# Patient Record
Sex: Male | Born: 1945
Health system: Southern US, Community
[De-identification: ages and names within clinical notes are randomized; demographics above are authoritative.]

## PROBLEM LIST (undated history)

## (undated) DIAGNOSIS — I2584 Coronary atherosclerosis due to calcified coronary lesion: Secondary | ICD-10-CM

## (undated) DIAGNOSIS — I428 Other cardiomyopathies: Secondary | ICD-10-CM

## (undated) DIAGNOSIS — G4733 Obstructive sleep apnea (adult) (pediatric): Secondary | ICD-10-CM

## (undated) DIAGNOSIS — F1011 Alcohol abuse, in remission: Secondary | ICD-10-CM

## (undated) DIAGNOSIS — I10 Essential (primary) hypertension: Secondary | ICD-10-CM

## (undated) DIAGNOSIS — G4730 Sleep apnea, unspecified: Secondary | ICD-10-CM

## (undated) DIAGNOSIS — Q245 Malformation of coronary vessels: Secondary | ICD-10-CM

## (undated) DIAGNOSIS — E739 Lactose intolerance, unspecified: Secondary | ICD-10-CM

## (undated) DIAGNOSIS — E559 Vitamin D deficiency, unspecified: Secondary | ICD-10-CM

## (undated) DIAGNOSIS — F1010 Alcohol abuse, uncomplicated: Secondary | ICD-10-CM

## (undated) DIAGNOSIS — E785 Hyperlipidemia, unspecified: Secondary | ICD-10-CM

## (undated) DIAGNOSIS — R0602 Shortness of breath: Secondary | ICD-10-CM

## (undated) DIAGNOSIS — I2510 Atherosclerotic heart disease of native coronary artery without angina pectoris: Secondary | ICD-10-CM

## (undated) DIAGNOSIS — I5022 Chronic systolic (congestive) heart failure: Secondary | ICD-10-CM

## (undated) HISTORY — DX: Coronary atherosclerosis due to calcified coronary lesion: I25.84

## (undated) HISTORY — DX: Other cardiomyopathies: I42.8

## (undated) HISTORY — DX: Malformation of coronary vessels: Q24.5

## (undated) HISTORY — DX: Essential (primary) hypertension: I10

## (undated) HISTORY — DX: Obstructive sleep apnea (adult) (pediatric): G47.33

## (undated) HISTORY — DX: Atherosclerotic heart disease of native coronary artery without angina pectoris: I25.10

## (undated) HISTORY — DX: Alcohol abuse, in remission: F10.11

## (undated) HISTORY — DX: Hyperlipidemia, unspecified: E78.5

## (undated) HISTORY — DX: Chronic systolic (congestive) heart failure: I50.22

## (undated) HISTORY — DX: Shortness of breath: R06.02

## (undated) HISTORY — DX: Sleep apnea, unspecified: G47.30

## (undated) HISTORY — DX: Vitamin D deficiency, unspecified: E55.9

## (undated) HISTORY — DX: Alcohol abuse, uncomplicated: F10.10

## (undated) HISTORY — DX: Lactose intolerance, unspecified: E73.9

---

## 2006-04-23 ENCOUNTER — Ambulatory Visit: Payer: Self-pay | Admitting: Pulmonary Disease

## 2006-04-23 ENCOUNTER — Ambulatory Visit: Payer: Self-pay | Admitting: Cardiology

## 2006-04-23 ENCOUNTER — Inpatient Hospital Stay (HOSPITAL_COMMUNITY): Admission: EM | Admit: 2006-04-23 | Discharge: 2006-04-28 | Payer: Self-pay | Admitting: Emergency Medicine

## 2006-04-23 ENCOUNTER — Encounter: Payer: Self-pay | Admitting: Cardiology

## 2006-05-10 ENCOUNTER — Ambulatory Visit: Payer: Self-pay | Admitting: Cardiology

## 2006-05-10 LAB — CONVERTED CEMR LAB
BUN: 14 mg/dL (ref 6–23)
CO2: 29 meq/L (ref 19–32)
Creatinine, Ser: 1.4 mg/dL (ref 0.4–1.5)
Glucose, Bld: 72 mg/dL (ref 70–99)

## 2006-05-24 ENCOUNTER — Ambulatory Visit: Payer: Self-pay | Admitting: Internal Medicine

## 2006-06-16 ENCOUNTER — Ambulatory Visit: Payer: Self-pay | Admitting: Internal Medicine

## 2006-06-20 ENCOUNTER — Ambulatory Visit: Payer: Self-pay | Admitting: Cardiology

## 2006-08-04 ENCOUNTER — Ambulatory Visit: Payer: Self-pay | Admitting: Cardiology

## 2006-08-22 ENCOUNTER — Ambulatory Visit: Payer: Self-pay | Admitting: Internal Medicine

## 2006-08-22 LAB — CONVERTED CEMR LAB
BUN: 13 mg/dL (ref 6–23)
GFR calc Af Amer: 88 mL/min
GFR calc non Af Amer: 73 mL/min
PSA: 0.81 ng/mL (ref 0.10–4.00)
Potassium: 4.2 meq/L (ref 3.5–5.1)

## 2006-08-30 ENCOUNTER — Ambulatory Visit: Payer: Self-pay | Admitting: Cardiology

## 2006-09-29 ENCOUNTER — Ambulatory Visit: Payer: Self-pay | Admitting: Cardiology

## 2006-09-29 LAB — CONVERTED CEMR LAB
BUN: 11 mg/dL (ref 6–23)
CO2: 31 meq/L (ref 19–32)
Calcium: 9.1 mg/dL (ref 8.4–10.5)
GFR calc Af Amer: 98 mL/min
GFR calc non Af Amer: 81 mL/min
Pro B Natriuretic peptide (BNP): 96 pg/mL (ref 0.0–100.0)

## 2006-10-17 ENCOUNTER — Ambulatory Visit: Payer: Self-pay | Admitting: Gastroenterology

## 2006-10-19 ENCOUNTER — Ambulatory Visit: Payer: Self-pay

## 2006-10-19 ENCOUNTER — Encounter: Payer: Self-pay | Admitting: Cardiology

## 2006-10-21 ENCOUNTER — Ambulatory Visit: Payer: Self-pay | Admitting: Gastroenterology

## 2006-11-08 ENCOUNTER — Ambulatory Visit: Payer: Self-pay | Admitting: Internal Medicine

## 2006-12-27 ENCOUNTER — Ambulatory Visit: Payer: Self-pay | Admitting: Internal Medicine

## 2007-01-09 ENCOUNTER — Ambulatory Visit: Payer: Self-pay | Admitting: Cardiology

## 2007-01-12 ENCOUNTER — Ambulatory Visit: Payer: Self-pay | Admitting: Cardiology

## 2007-01-12 LAB — CONVERTED CEMR LAB
Calcium: 9.1 mg/dL (ref 8.4–10.5)
Creatinine, Ser: 1.2 mg/dL (ref 0.4–1.5)
GFR calc Af Amer: 80 mL/min
Glucose, Bld: 107 mg/dL — ABNORMAL HIGH (ref 70–99)
Potassium: 4.1 meq/L (ref 3.5–5.1)
Pro B Natriuretic peptide (BNP): 92 pg/mL (ref 0.0–100.0)

## 2007-03-30 ENCOUNTER — Ambulatory Visit: Payer: Self-pay | Admitting: Cardiology

## 2007-08-02 DIAGNOSIS — I10 Essential (primary) hypertension: Secondary | ICD-10-CM | POA: Insufficient documentation

## 2007-08-02 DIAGNOSIS — Z8679 Personal history of other diseases of the circulatory system: Secondary | ICD-10-CM | POA: Insufficient documentation

## 2008-01-18 ENCOUNTER — Encounter: Payer: Self-pay | Admitting: Internal Medicine

## 2008-01-19 ENCOUNTER — Ambulatory Visit: Payer: Self-pay | Admitting: Internal Medicine

## 2008-01-19 LAB — CONVERTED CEMR LAB
Basophils Absolute: 0 10*3/uL (ref 0.0–0.1)
Basophils Relative: 0 % (ref 0.0–3.0)
CO2: 31 meq/L (ref 19–32)
Creatinine, Ser: 1.2 mg/dL (ref 0.4–1.5)
Eosinophils Relative: 7.1 % — ABNORMAL HIGH (ref 0.0–5.0)
GFR calc non Af Amer: 65 mL/min
Glucose, Bld: 102 mg/dL — ABNORMAL HIGH (ref 70–99)
Hemoglobin: 15.3 g/dL (ref 13.0–17.0)
Lymphocytes Relative: 37.5 % (ref 12.0–46.0)
Neutro Abs: 1.5 10*3/uL (ref 1.4–7.7)
PSA: 0.78 ng/mL (ref 0.10–4.00)
RBC: 5.31 M/uL (ref 4.22–5.81)
RDW: 14.1 % (ref 11.5–14.6)
Sodium: 142 meq/L (ref 135–145)

## 2008-01-22 ENCOUNTER — Encounter: Payer: Self-pay | Admitting: Internal Medicine

## 2008-02-12 ENCOUNTER — Telehealth: Payer: Self-pay | Admitting: Internal Medicine

## 2008-03-21 ENCOUNTER — Ambulatory Visit: Payer: Self-pay | Admitting: Cardiology

## 2008-03-21 ENCOUNTER — Encounter: Payer: Self-pay | Admitting: Nurse Practitioner

## 2008-03-21 DIAGNOSIS — R9431 Abnormal electrocardiogram [ECG] [EKG]: Secondary | ICD-10-CM | POA: Insufficient documentation

## 2008-07-22 ENCOUNTER — Ambulatory Visit: Payer: Self-pay | Admitting: Internal Medicine

## 2008-07-22 LAB — CONVERTED CEMR LAB
BUN: 16 mg/dL (ref 6–23)
CO2: 32 meq/L (ref 19–32)
Chloride: 108 meq/L (ref 96–112)
Glucose, Bld: 96 mg/dL (ref 70–99)
HDL: 57.5 mg/dL (ref >39.00)
Potassium: 4.4 meq/L (ref 3.5–5.1)
Sodium: 145 meq/L (ref 135–145)
Total CHOL/HDL Ratio: 3
VLDL: 25.6 mg/dL (ref 0.0–40.0)

## 2009-01-16 ENCOUNTER — Encounter: Payer: Self-pay | Admitting: Internal Medicine

## 2009-01-20 ENCOUNTER — Ambulatory Visit: Payer: Self-pay | Admitting: Internal Medicine

## 2009-06-26 ENCOUNTER — Ambulatory Visit: Payer: Self-pay | Admitting: Internal Medicine

## 2009-06-26 LAB — CONVERTED CEMR LAB
AST: 16 units/L (ref 0–37)
Albumin: 4 g/dL (ref 3.5–5.2)
BUN: 17 mg/dL (ref 6–23)
Bilirubin, Direct: 0.1 mg/dL (ref 0.0–0.3)
CO2: 25 meq/L (ref 19–32)
Chloride: 105 meq/L (ref 96–112)
Indirect Bilirubin: 0.4 mg/dL (ref 0.0–0.9)
PSA: 0.92 ng/mL (ref 0.10–4.00)
Potassium: 4.3 meq/L (ref 3.5–5.3)
Sodium: 141 meq/L (ref 135–145)
TSH: 0.93 microintl units/mL (ref 0.350–4.500)
Total Bilirubin: 0.5 mg/dL (ref 0.3–1.2)
Total CHOL/HDL Ratio: 3.5
VLDL: 15 mg/dL (ref 0–40)

## 2009-07-01 ENCOUNTER — Ambulatory Visit: Payer: Self-pay | Admitting: Internal Medicine

## 2009-08-26 ENCOUNTER — Telehealth: Payer: Self-pay | Admitting: Internal Medicine

## 2009-12-18 ENCOUNTER — Ambulatory Visit: Payer: Self-pay | Admitting: Internal Medicine

## 2009-12-18 LAB — CONVERTED CEMR LAB
BUN: 17 mg/dL (ref 6–23)
Calcium: 9.2 mg/dL (ref 8.4–10.5)
Creatinine, Ser: 1.22 mg/dL (ref 0.40–1.50)
Potassium: 4.4 meq/L (ref 3.5–5.3)

## 2009-12-22 ENCOUNTER — Encounter: Payer: Self-pay | Admitting: Internal Medicine

## 2009-12-25 ENCOUNTER — Ambulatory Visit: Payer: Self-pay | Admitting: Internal Medicine

## 2009-12-25 ENCOUNTER — Ambulatory Visit (HOSPITAL_BASED_OUTPATIENT_CLINIC_OR_DEPARTMENT_OTHER): Admission: RE | Admit: 2009-12-25 | Discharge: 2009-12-25 | Payer: Self-pay | Admitting: Internal Medicine

## 2009-12-25 ENCOUNTER — Telehealth: Payer: Self-pay | Admitting: Internal Medicine

## 2009-12-25 ENCOUNTER — Ambulatory Visit: Payer: Self-pay | Admitting: Diagnostic Radiology

## 2009-12-25 DIAGNOSIS — M25539 Pain in unspecified wrist: Secondary | ICD-10-CM | POA: Insufficient documentation

## 2009-12-31 ENCOUNTER — Encounter: Payer: Self-pay | Admitting: Internal Medicine

## 2010-01-01 ENCOUNTER — Telehealth: Payer: Self-pay | Admitting: Internal Medicine

## 2010-01-05 ENCOUNTER — Telehealth: Payer: Self-pay | Admitting: Internal Medicine

## 2010-01-14 ENCOUNTER — Encounter: Payer: Self-pay | Admitting: Internal Medicine

## 2010-02-04 ENCOUNTER — Telehealth (INDEPENDENT_AMBULATORY_CARE_PROVIDER_SITE_OTHER): Payer: Self-pay | Admitting: *Deleted

## 2010-05-12 NOTE — Progress Notes (Signed)
Summary: Medication Transfer  Phone Note From Pharmacy Call back at [phone]   Caller: [facility] Reason for Call: Needs renewal Summary of Call: voice message left by Morrie Sheldon at [facility] [place] stating is requesting transfer of medication for Coreg, Lisinopril and Klor Con to there pharmacy, however patient will need a new rx.   Initial call taken by: Glendell Docker CMA,  Aug 26, 2009 1:57 PM  Follow-up for Phone Call        call placed to patient at [phone], no answer, voice message left to return call, attemtped to contact patient at [phone], male individual stated employees could not recieve call unless it is an emergency Follow-up by: Glendell Docker CMA,  Aug 27, 2009 12:14 PM  Additional Follow-up for Phone Call Additional follow up Details #1::        no return call from patient or pharmacy for medication request Additional Follow-up by: Glendell Docker CMA,  Aug 29, 2009 12:56 PM

## 2010-05-12 NOTE — Letter (Signed)
   [place] [facility] [address] [place], [place]  [address]  [place] Phone: [phone]      December 22, 2009   [facility] [address] [place], [place] [address]  RE:  LAB RESULTS  Dear  Mr. [place]ardee,  The following is an interpretation of your most recent lab tests.  Please take note of any instructions provided or changes to medications that have resulted from your lab work.  ELECTROLYTES:  Good - no changes needed  KIDNEY FUNCTION TESTS:  Good - no changes needed          Sincerely Yours,    Dr. Thomos Lemons  Appended Document:  mailed

## 2010-05-12 NOTE — Progress Notes (Signed)
Summary: wrist xray  Phone Note Outgoing Call   Summary of Call: call pt - wrist x ray is negative for fracture Initial call taken by: D. Thomos Lemons DO,  December 25, 2009 5:54 PM  Follow-up for Phone Call        Pt notified and advised to keep appt. with ortho. Nicki Guadalajara Fergerson CMA Duncan Dull)  December 26, 2009 10:32 AM

## 2010-05-12 NOTE — Assessment & Plan Note (Signed)
Summary: Injury to wrist/ fell this am, option to go to ED  pt want--Rm 3   Vital Signs:  Patient profile:   65 year old male Height:      69 inches Weight:      206.25 pounds BMI:     30.57 O2 Sat:      96 % on Room air Temp:     97.8 degrees F oral Pulse rate:   83 / minute Pulse rhythm:   regular Resp:     16 per minute BP sitting:   124 / 86  (right arm) Cuff size:   large  Vitals Entered By: Mervin Kung CMA Duncan Dull) (December 25, 2009 1:53 PM)  O2 Flow:  Room air CC: Rm 3  Pt fell on right wrist today. Wrist is now swollen, back hurts and has abrasions on left elbow. Is Patient Diabetic? No Pain Assessment Patient in pain? yes     Location: back spasm Intensity: 4 Type: spasm Onset of pain  11:30 am. Comments Pt agrees med doses and directions correct. Nicki Guadalajara Fergerson CMA Duncan Dull)  December 25, 2009 1:57 PM    Primary Care Provider:  Dondra Spry DO  CC:  Rm 3  Pt fell on right wrist today. Wrist is now swollen and back hurts and has abrasions on left elbow..  History of Present Illness: 65 y/o AA male c/o right wrist pain pt was walking his dogs this AM when other dogs from neighborhood house charged his dog he fell while he was trying to protect his dog   Preventive Screening-Counseling & Management  Alcohol-Tobacco     Smoking Status: quit     Year Quit: 2004     Pack years: 1 ppd  Allergies (verified): No Known Drug Allergies  Past History:  Past Medical History: Hypertension Nonischemic cardiomyopathy presumed secondary to Etoh and Htn  ( Prev EF 20-25%  - improved to 50%) Hx of Etoh abuse  Anamalous right coronary      Family History: Mother and father both with alcoholism.  Mother deceased with history of heart disease.   No family history of type 2 diabetes.        Social History: Retired from [employer] Alcohol use-quit ETOH 18 months Quit 2004 -smoked for 20 yrs 2 sons and daughter  1 son in [name]   Living with wife 33 yrs (wife  is Government social research officer) Hx marijuana use  Use to live near [place].  Physical Exam  General:  alert, well-developed, and well-nourished.   Lungs:  normal respiratory effort and normal breath sounds.   Heart:  normal rate, regular rhythm, and no gallop.   Msk:  right wrist with swelling and tenderness.  no joint warmth and no joint instability.   Extremities:  No lower extremity edema   Impression & Recommendations:  Problem # 1:  WRIST PAIN, RIGHT (ICD-719.43) right wrist pain after fall.  rule out colles fracture.  refer to hand specilized.  rest, ice, compression with wrist splint advised  Orders: T-Wrist Comp Right (73110TC) Orthopedic Referral (Ortho)  Complete Medication List: 1)  Lisinopril 5 Mg Tabs (Lisinopril) .... One by mouth bid 2)  Klor-con M10 10 Meq Cr-tabs (Potassium chloride crys cr) .... Take 1 tablet by mouth once a day 3)  Furosemide 20 Mg Tabs (Furosemide) .... One by mouth once daily 4)  Carvedilol 25 Mg Tabs (Carvedilol) .... Take 1 tablet by mouth two times a day 5)  Fish Oil 1000 Mg Caps (Omega-3 fatty  acids) .... Take 1 tablet by mouth four times a day 6)  Centrum Silver Ultra Mens Tabs (Multiple vitamins-minerals) .... Take 1 tablet by mouth once a day 7)  Aspirin Ec Low Dose 81 Mg Tbec (Aspirin) .... Take 1 tablet by mouth once a day 8)  Tramadol Hcl 50 Mg Tabs (Tramadol hcl) .... One by mouth three times a day prn 9)  Vicodin 5-500 Mg Tabs (Hydrocodone-acetaminophen) .... One tablet by mouth every 4-6 hours as needed wrist pain  Patient Instructions: 1)  Use ice, compression and elevation to right wrist 2)  Call our office if your low back symptoms do not  improve or gets worse. Prescriptions: TRAMADOL HCL 50 MG TABS (TRAMADOL HCL) one by mouth three times a day prn  #30 x 0   Entered and Authorized by:   D. Thomos Lemons DO   Signed by:   D. Thomos Lemons DO on 12/25/2009   Method used:   Electronically to        [facility] [address] (retail)        [phone]       [place], [place]  [address]       Ph: [phone]       Fax: [phone]   RxID:   [id number]   Current Allergies (reviewed today): No known allergies

## 2010-05-12 NOTE — Letter (Signed)
Summary: [facility]  [facility] [place]   Imported By: Maryln Gottron 01/26/2010 14:54:25  _____________________________________________________________________  External Attachment:    Type:   Image     Comment:   External Document

## 2010-05-12 NOTE — Progress Notes (Signed)
Summary: Medication Question  Phone Note Call from Patient Call back at Home Phone [phone]   Caller: Patient Call For: D. Thomos Lemons DO Summary of Call: patient called and left voice message wanting to know if it would okay for him to take Vicodin for his hand that was prescribed by Dr Teressa Senter Initial call taken by: Glendell Docker CMA,  January 01, 2010 4:47 PM  Follow-up for Phone Call        yes Follow-up by: D. Thomos Lemons DO,  January 01, 2010 4:48 PM  Additional Follow-up for Phone Call Additional follow up Details #1::        call returned to patient he has been advised per Dr Artist Pais instructions Additional Follow-up by: Glendell Docker CMA,  January 01, 2010 5:04 PM    New/Updated Medications: VICODIN 5-500 MG TABS (HYDROCODONE-ACETAMINOPHEN) one tablet by mouth every 4-6 hours as needed wrist pain

## 2010-05-12 NOTE — Letter (Signed)
Summary: [facility]  [facility] [place]   Imported By: Maryln Gottron 01/26/2010 14:56:14  _____________________________________________________________________  External Attachment:    Type:   Image     Comment:   External Document

## 2010-05-12 NOTE — Progress Notes (Signed)
  Phone Note Other Incoming   Request: Send information Summary of Call: Request for records received from [facility]. Request forwarded to [employer].

## 2010-05-12 NOTE — Progress Notes (Signed)
Summary: Tramadol refill  Phone Note Refill Request Call back at Home Phone [phone] Message from:  Patient on January 05, 2010 9:12 AM  Refills Requested: Medication #1:  TRAMADOL HCL 50 MG TABS one by mouth three times a day prn   Brand Name Necessary? No   Supply Requested: 1 month [facility] [address]    Method Requested: Electronic Next Appointment Scheduled: 3.12.12 Initial call taken by: Lannette Donath,  January 05, 2010 9:12 AM  Follow-up for Phone Call        refill tramadol x 1 only pt only to take short term Follow-up by: D. Thomos Lemons DO,  January 05, 2010 12:17 PM  Additional Follow-up for Phone Call Additional follow up Details #1::        call placed to patient at [phone], he was informed of rx to pharmacy, and  per Dr Artist Pais the medication is to be taken short term. Patient verbalized understanding Additional Follow-up by: Glendell Docker CMA,  January 05, 2010 1:46 PM    Prescriptions: TRAMADOL HCL 50 MG TABS (TRAMADOL HCL) one by mouth three times a day prn  #30 x 0   Entered by:   Glendell Docker CMA   Authorized by:   D. Thomos Lemons DO   Signed by:   Glendell Docker CMA on 01/05/2010   Method used:   Electronically to        [facility] [address] (retail)       [phone]       [place], [place]  [address]       Ph: [phone]       Fax: [phone]   RxID:   [id number]

## 2010-05-12 NOTE — Assessment & Plan Note (Signed)
Summary: 6 month follow up/mhf rsc with pt/mhf   Vital Signs:  Patient profile:   65 year old male Height:      69 inches Weight:      211 pounds BMI:     31.27 O2 Sat:      96 % on Room air Temp:     98.0 degrees F oral Pulse rate:   75 / minute Pulse rhythm:   regular Resp:     16 per minute BP sitting:   90 / 60  (right arm)  Vitals Entered By: Glendell Docker CMA (July 01, 2009 2:49 PM)  O2 Flow:  Room air CC: Rm 3- 6 Month Follow up   Primary Care Provider:  Dondra Spry DO  CC:  Rm 3- 6 Month Follow up.  History of Present Illness:  Hypertension Follow-Up      This is a 65 year old man who presents for Hypertension follow-up.  The patient denies lightheadedness and headaches.  The patient denies the following associated symptoms: chest pain.  Compliance with medications (by patient report) has been near 100%.  The patient reports that dietary compliance has been excellent.  The patient reports exercising daily.  He walks six miles per day  Preventive Screening-Counseling & Management  Alcohol-Tobacco     Smoking Status: quit  Allergies (verified): No Known Drug Allergies  Past History:  Past Medical History: Hypertension Nonischemic cardiomyopathy presumed secondary to Etoh and Htn  ( Prev EF 20-25%  - improved to 50%) Hx of Etoh abuse  Anamalous right coronary    Family History: Mother and father both with alcoholism.  Mother deceased with history of heart disease.   No family history of type 2 diabetes.       Social History: Retired from [employer] Alcohol use-quit ETOH 18 months Quit 2004 -smoked for 20 yrs 2 sons and daughter  1 son in [name]  Living with wife 33 yrs Hx marijuana use Use to live near [place].  Physical Exam  General:  alert, well-developed, and well-nourished.   Lungs:  normal respiratory effort and normal breath sounds.   Heart:  normal rate, regular rhythm, and no gallop.   Extremities:  No lower extremity  edema  Neurologic:  cranial nerves II-XII intact and gait normal.   Psych:  normally interactive, good eye contact, not anxious appearing, and not depressed appearing.     Impression & Recommendations:  Problem # 1:  HYPERTENSION (ICD-401.9) Pt getting SBP readings 78-90 at home.  decrease lisinopril and furosemide dose.  surprisingly he is asymptomatic  His updated medication list for this problem includes:    Lisinopril 5 Mg Tabs (Lisinopril) ..... One by mouth bid    Furosemide 20 Mg Tabs (Furosemide) ..... One by mouth once daily    Carvedilol 25 Mg Tabs (Carvedilol) .Marland Kitchen... Take 1 tablet by mouth two times a day  BP today: 90/60 Prior BP: 120/80 (01/20/2009)  Labs Reviewed: K+: 4.3 (06/26/2009) Creat: : 1.11 (06/26/2009)   Chol: 184 (06/26/2009)   HDL: 52 (06/26/2009)   LDL: 117 (06/26/2009)   TG: 76 (06/26/2009)  Complete Medication List: 1)  Lisinopril 5 Mg Tabs (Lisinopril) .... One by mouth bid 2)  Klor-con M10 10 Meq Cr-tabs (Potassium chloride crys cr) .... Take 1 tablet by mouth once a day 3)  Furosemide 20 Mg Tabs (Furosemide) .... One by mouth once daily 4)  Carvedilol 25 Mg Tabs (Carvedilol) .... Take 1 tablet by mouth two times a day 5)  Fish Oil 1000 Mg Caps (Omega-3 fatty acids) .... Take 1 tablet by mouth four times a day 6)  Centrum Silver Ultra Mens Tabs (Multiple vitamins-minerals) .... Take 1 tablet by mouth once a day 7)  Aspirin Ec Low Dose 81 Mg Tbec (Aspirin) .... Take 1 tablet by mouth once a day  Patient Instructions: 1)  Please schedule a follow-up appointment in 2 months. Prescriptions: KLOR-CON M10 10 MEQ CR-TABS (POTASSIUM CHLORIDE CRYS CR) Take 1 tablet by mouth once a day  #90 x 1   Entered and Authorized by:   D. Thomos Lemons DO   Signed by:   D. Thomos Lemons DO on 07/01/2009   Method used:   Print then Give to Patient   RxID:   [id number] CARVEDILOL 25 MG TABS (CARVEDILOL) Take 1 tablet by mouth two times a day  #180 x 1   Entered and  Authorized by:   D. Thomos Lemons DO   Signed by:   D. Thomos Lemons DO on 07/01/2009   Method used:   Print then Give to Patient   RxID:   [id number] LISINOPRIL 5 MG TABS (LISINOPRIL) one by mouth bid  #180 x 1   Entered and Authorized by:   D. Thomos Lemons DO   Signed by:   D. Thomos Lemons DO on 07/01/2009   Method used:   Print then Give to Patient   RxID:   [id number] FUROSEMIDE 20 MG TABS (FUROSEMIDE) one by mouth once daily  #90 x 1   Entered and Authorized by:   D. Thomos Lemons DO   Signed by:   D. Thomos Lemons DO on 07/01/2009   Method used:   Print then Give to Patient   RxID:   [id number]   Current Allergies (reviewed today): No known allergies

## 2010-05-12 NOTE — Assessment & Plan Note (Signed)
Summary: med refill [name]   Vital Signs:  Patient profile:   65 year old male Height:      69 inches Weight:      206.75 pounds BMI:     30.64 O2 Sat:      97 % on Room air Temp:     97.9 degrees F oral Pulse rate:   72 / minute Pulse rhythm:   regular Resp:     18 per minute BP sitting:   100 / 60  (right arm) Cuff size:   large  Vitals Entered By: Glendell Docker CMA (December 18, 2009 1:07 PM)  O2 Flow:  Room air CC: follow-up visit Is Patient Diabetic? No Pain Assessment Patient in pain? no      Comments request printed rxs for veteran affairs   Primary Care Provider:  D. Thomos Lemons DO  CC:  follow-up visit.  History of Present Illness: 65 y/o AA male for f/u he tried to further decrease lisinopril dose but noticed bp elevation (sbp 130-140's) pt resumed prev dose he is exercising regularly planning on starting swimming program - [facility]  wife retired. he is planning on traveling to [place] and [place]  no chest pain good exercise tolerance  has not gone back to drinking alcohol   Preventive Screening-Counseling & Management  Alcohol-Tobacco     Smoking Status: quit  Allergies (verified): No Known Drug Allergies  Past History:  Past Medical History: Hypertension Nonischemic cardiomyopathy presumed secondary to Etoh and Htn  ( Prev EF 20-25%  - improved to 50%) Hx of Etoh abuse  Anamalous right coronary     Social History: Retired from [employer] Alcohol use-quit ETOH 18 months Quit 2004 -smoked for 20 yrs 2 sons and daughter  1 son in [name]   Living with wife 33 yrs Hx marijuana use Use to live near [place].  Physical Exam  General:  alert, well-developed, and well-nourished.   Neck:  supple, no masses, and no carotid bruits.   Lungs:  normal respiratory effort and normal breath sounds.   Heart:  normal rate, regular rhythm, and no gallop.   Extremities:  No lower extremity edema  Psych:  normally interactive, good eye  contact, not anxious appearing, and not depressed appearing.     Impression & Recommendations:  Problem # 1:  HYPERTENSION (ICD-401.9) Assessment Unchanged  His updated medication list for this problem includes:    Lisinopril 5 Mg Tabs (Lisinopril) ..... One by mouth bid    Furosemide 20 Mg Tabs (Furosemide) ..... One by mouth once daily    Carvedilol 25 Mg Tabs (Carvedilol) .Marland Kitchen... Take 1 tablet by mouth two times a day  Orders: T-Basic Metabolic Panel [id number])  BP today: 100/60 Prior BP: 90/60 (07/01/2009)  Labs Reviewed: K+: 4.3 (06/26/2009) Creat: : 1.11 (06/26/2009)   Chol: 184 (06/26/2009)   HDL: 52 (06/26/2009)   LDL: 117 (06/26/2009)   TG: 76 (06/26/2009)  Complete Medication List: 1)  Lisinopril 5 Mg Tabs (Lisinopril) .... One by mouth bid 2)  Klor-con M10 10 Meq Cr-tabs (Potassium chloride crys cr) .... Take 1 tablet by mouth once a day 3)  Furosemide 20 Mg Tabs (Furosemide) .... One by mouth once daily 4)  Carvedilol 25 Mg Tabs (Carvedilol) .... Take 1 tablet by mouth two times a day 5)  Fish Oil 1000 Mg Caps (Omega-3 fatty acids) .... Take 1 tablet by mouth four times a day 6)  Centrum Silver Ultra Mens Tabs (Multiple vitamins-minerals) .Marland KitchenMarland KitchenMarland Kitchen  Take 1 tablet by mouth once a day 7)  Aspirin Ec Low Dose 81 Mg Tbec (Aspirin) .... Take 1 tablet by mouth once a day  Patient Instructions: 1)  Please schedule a follow-up appointment in 6 months. 2)  BMP prior to visit, ICD-9:  401.9 3)  Hepatic Panel prior to visit, ICD-9:  272.4 4)  Lipid Panel prior to visit, ICD-9:  272.4 5)  PSA prior to visit, ICD-9: v76.44 6)  Please return for lab work one (1) week before your next appointment.  Prescriptions: CARVEDILOL 25 MG TABS (CARVEDILOL) Take 1 tablet by mouth two times a day  #180 x 0   Entered and Authorized by:   D. Thomos Lemons DO   Signed by:   D. Thomos Lemons DO on 12/18/2009   Method used:   Electronically to        [facility] [address] [place] (retail)        [phone]       [place], [place]  [address]       Ph: [phone]       Fax: [phone]   RxID:   [id number] FUROSEMIDE 20 MG TABS (FUROSEMIDE) one by mouth once daily  #90 x 0   Entered and Authorized by:   D. Thomos Lemons DO   Signed by:   D. Thomos Lemons DO on 12/18/2009   Method used:   Electronically to        [facility] [address] [place] (retail)       [phone]       [place], [place]  [address]       Ph: [phone]       Fax: [phone]   RxID:   [id number] KLOR-CON M10 10 MEQ CR-TABS (POTASSIUM CHLORIDE CRYS CR) Take 1 tablet by mouth once a day  #90 x 0   Entered and Authorized by:   D. Thomos Lemons DO   Signed by:   D. Thomos Lemons DO on 12/18/2009   Method used:   Electronically to        [facility] [address] [place] (retail)       [phone]       [place], [place]  [address]       Ph: [phone]       Fax: [phone]   RxID:   [id number] LISINOPRIL 5 MG TABS (LISINOPRIL) one by mouth bid  #180 x 0   Entered and Authorized by:   D. Thomos Lemons DO   Signed by:   D. Thomos Lemons DO on 12/18/2009   Method used:   Electronically to        [facility] [address] [place] (retail)       [phone]       [place], [place]  [address]       Ph: [phone]       Fax: [phone]   RxID:   [id number] CARVEDILOL 25 MG TABS (CARVEDILOL) Take 1 tablet by mouth two times a day  #180 x 3   Entered and Authorized by:   D. Thomos Lemons DO   Signed by:   D. Thomos Lemons DO on 12/18/2009   Method used:   Print then Give to Patient   RxID:   [id number] FUROSEMIDE 20 MG TABS (FUROSEMIDE)  one by mouth once daily  #90 x 3   Entered and Authorized by:   D. Thomos Lemons DO   Signed by:   D. Thomos Lemons DO on 12/18/2009   Method used:   Print then Give to Patient   RxID:   [id number] KLOR-CON M10 10 MEQ CR-TABS (POTASSIUM CHLORIDE CRYS CR) Take 1  tablet by mouth once a day  #90 x 3   Entered and Authorized by:   D. Thomos Lemons DO   Signed by:   D. Thomos Lemons DO on 12/18/2009   Method used:   Print then Give to Patient   RxID:   [id number] LISINOPRIL 5 MG TABS (LISINOPRIL) one by mouth bid  #180 x 3   Entered and Authorized by:   D. Thomos Lemons DO   Signed by:   D. Thomos Lemons DO on 12/18/2009   Method used:   Print then Give to Patient   RxID:   [id number]   Current Allergies (reviewed today): No known allergies        Appended Document: Orders Update    Clinical Lists Changes  Orders: Added new Service order of Influenza Vaccine NON MCR (01027) - Signed Added new Service order of Flu Vaccine 62yrs + MEDICARE PATIENTS ([id number]) - Signed Observations: Added new observation of FLU VAX#1VIS: 11/04/2009 (12/18/2009 13:44) Added new observation of FLU VAXLOT: [id number] (12/18/2009 13:44) Added new observation of FLU VAX EXP: 10/10/2010 (12/18/2009 13:44) Added new observation of FLU VAXBY: Darlene Knight CMA (12/18/2009 13:44) Added new observation of FLU VAXRTE: IM (12/18/2009 13:44) Added new observation of FLU VAX DSE: 0.5 ml (12/18/2009 13:44) Added new observation of FLU VAXMFR: GlaxoSmithKline (12/18/2009 13:44) Added new observation of FLU VAX SITE: left deltoid (12/18/2009 13:44) Added new observation of FLU VAX: Fluvax Non-MCR (12/18/2009 13:44)       Immunizations Administered:  Influenza Vaccine # 1:    Vaccine Type: Fluvax Non-MCR    Site: left deltoid    Mfr: GlaxoSmithKline    Dose: 0.5 ml    Route: IM    Given by: Glendell Docker CMA    Exp. Date: 10/10/2010    Lot #: [id number]    VIS given: 11/04/2009  Flu Vaccine Consent Questions:    Do you have a history of severe allergic reactions to this vaccine? no    Any prior history of allergic reactions to egg and/or gelatin? no    Do you have a sensitivity to the preservative Thimersol? no    Do you have a past history of  Guillan-Barre Syndrome? no    Do you currently have an acute febrile illness? no    Have you ever had a severe reaction to latex? no    Vaccine information given and explained to patient? yes

## 2010-06-22 ENCOUNTER — Encounter: Payer: Self-pay | Admitting: Internal Medicine

## 2010-06-22 ENCOUNTER — Ambulatory Visit (INDEPENDENT_AMBULATORY_CARE_PROVIDER_SITE_OTHER): Payer: Federal, State, Local not specified - PPO | Admitting: Internal Medicine

## 2010-06-22 DIAGNOSIS — I10 Essential (primary) hypertension: Secondary | ICD-10-CM

## 2010-07-14 NOTE — Assessment & Plan Note (Signed)
Summary: 6 month follow up/mhf   Vital Signs:  Patient profile:   65 year old male Height:      69 inches Weight:      212.75 pounds BMI:     31.53 O2 Sat:      98 % on Room air Temp:     97.9 degrees F oral Pulse rate:   62 / minute Resp:     18 per minute BP sitting:   110 / 80  (left arm) Cuff size:   large  Vitals Entered By: Glendell Docker CMA (June 22, 2010 10:17 AM)  O2 Flow:  Room air CC: 6 Month Follow up  Is Patient Diabetic? No Pain Assessment Patient in pain? no      Comments fasting for blood work, discuss plant based diet hes been on for the past 3 weeks   Primary Care Provider:  DThomos Lemons DO  CC:  6 Month Follow up .  History of Present Illness: 65 y/o male for with hx of htn and nonischemic cardiomyopathy follow up  he is trying plant based diet main protein source - soy ,  bean based feeling better some wt loss  taking same dose of bp meds.  denies dizziness   Preventive Screening-Counseling & Management  Alcohol-Tobacco     Smoking Status: quit  Allergies (verified): No Known Drug Allergies  Past History:  Past Medical History: Hypertension Nonischemic cardiomyopathy presumed secondary to Etoh and Htn  ( Prev EF 20-25%  - improved to 50%) Hx of Etoh abuse  Anamalous right coronary       Family History: Mother and father both with alcoholism.  Mother deceased with history of heart disease.   No family history of type 2 diabetes.         Social History: Retired from [employer] Alcohol use-quit ETOH 18 months Quit 2004 -smoked for 20 yrs 2 sons and daughter   1 son in [name]   Living with wife 33 yrs (wife is Government social research officer) Hx marijuana use  Use to live near [place].  Physical Exam  General:  alert, well-developed, and well-nourished.   Lungs:  normal respiratory effort and normal breath sounds.   Heart:  normal rate, regular rhythm, and no gallop.   Extremities:  No lower extremity edema   Impression &  Recommendations:  Problem # 1:  HYPERTENSION (ICD-401.9) Assessment Unchanged Pt trying plant bases diet.  we discussed tapering off bp meds if he starts to experience sbp < 100  His updated medication list for this problem includes:    Lisinopril 5 Mg Tabs (Lisinopril) ..... One by mouth bid    Furosemide 20 Mg Tabs (Furosemide) ..... One by mouth once daily    Carvedilol 25 Mg Tabs (Carvedilol) .Marland Kitchen... Take 1 tablet by mouth two times a day  BP today: 110/80 Prior BP: 124/86 (12/25/2009)  Labs Reviewed: K+: 4.4 (12/18/2009) Creat: : 1.22 (12/18/2009)   Chol: 184 (06/26/2009)   HDL: 52 (06/26/2009)   LDL: 117 (06/26/2009)   TG: 76 (06/26/2009)  Future Orders: T-Basic Metabolic Panel [id number]) ... 12/21/2010  Complete Medication List: 1)  Lisinopril 5 Mg Tabs (Lisinopril) .... One by mouth bid 2)  Klor-con M10 10 Meq Cr-tabs (Potassium chloride crys cr) .... Take 1 tablet by mouth once a day 3)  Furosemide 20 Mg Tabs (Furosemide) .... One by mouth once daily 4)  Carvedilol 25 Mg Tabs (Carvedilol) .... Take 1 tablet by mouth two times a day 5)  Fish Oil  1000 Mg Caps (Omega-3 fatty acids) .... Take 1 tablet by mouth four times a day 6)  Centrum Silver Ultra Mens Tabs (Multiple vitamins-minerals) .... Take 1 tablet by mouth once a day 7)  Aspirin Ec Low Dose 81 Mg Tbec (Aspirin) .... Take 1 tablet by mouth once a day 8)  Tramadol Hcl 50 Mg Tabs (Tramadol hcl) .... One by mouth three times a day prn 9)  Vicodin 5-500 Mg Tabs (Hydrocodone-acetaminophen) .... One tablet by mouth every 4-6 hours as needed wrist pain  Other Orders: Future Orders: T-Hepatic Function ([id number]) ... 12/21/2010 T-Lipid Profile [id number]) ... 12/21/2010  Patient Instructions: 1)  Please schedule a follow-up appointment in 6 months. 2)  BMP prior to visit, ICD-9:  401.9 3)  Hepatic Panel prior to visit, ICD-9:  272.4 4)  Lipid Panel prior to visit, ICD-9:  272.4 5)  Please return for lab work one  (1) week before your next appointment.  Prescriptions: CARVEDILOL 25 MG TABS (CARVEDILOL) Take 1 tablet by mouth two times a day  #180 x 1   Entered and Authorized by:   D. Thomos Lemons DO   Signed by:   D. Thomos Lemons DO on 06/22/2010   Method used:   Electronically to        [facility]* (retail)       [phone]       [place], [place]  [address]       Ph: [phone]       Fax: [phone]   RxID:   [id number] FUROSEMIDE 20 MG TABS (FUROSEMIDE) one by mouth once daily  #90 x 1   Entered and Authorized by:   D. Thomos Lemons DO   Signed by:   D. Thomos Lemons DO on 06/22/2010   Method used:   Electronically to        [facility] (retail)       [phone]       [place], [place]  [address]       Ph: [phone]       Fax: [phone]   RxID:   [id number] KLOR-CON M10 10 MEQ CR-TABS (POTASSIUM CHLORIDE CRYS CR) Take 1 tablet by mouth once a day  #90 Each x 1   Entered and Authorized by:   D. Thomos Lemons DO   Signed by:   D. Thomos Lemons DO on 06/22/2010   Method used:   Electronically to        [facility] (retail)       [phone]       [place], [place]  [address]       Ph: [phone]       Fax: [phone]   RxID:   [id number] LISINOPRIL 5 MG TABS (LISINOPRIL) one by mouth bid  #180 Each x 1   Entered and Authorized by:   D. Thomos Lemons DO   Signed by:   D. Thomos Lemons DO on 06/22/2010   Method used:   Electronically to        [facility] (retail)       [phone]       [place], [place]  [address]       Ph: [phone]       Fax: [phone]   RxID:   [id number]  Orders Added: 1)  T-Basic Metabolic Panel [[id number]] 2)  T-Hepatic Function [[id number]] 3)  T-Lipid Profile [[id number]] 4)  Est. Patient Level III [[address]]    Current Allergies (reviewed today): No known allergies

## 2010-08-03 ENCOUNTER — Ambulatory Visit: Payer: Federal, State, Local not specified - PPO | Admitting: Family

## 2010-08-03 DIAGNOSIS — Z0289 Encounter for other administrative examinations: Secondary | ICD-10-CM

## 2010-08-04 ENCOUNTER — Encounter: Payer: Self-pay | Admitting: Family

## 2010-08-05 ENCOUNTER — Encounter: Payer: Self-pay | Admitting: Family

## 2010-08-05 ENCOUNTER — Ambulatory Visit (INDEPENDENT_AMBULATORY_CARE_PROVIDER_SITE_OTHER): Payer: Federal, State, Local not specified - PPO | Admitting: Family

## 2010-08-05 DIAGNOSIS — Z8679 Personal history of other diseases of the circulatory system: Secondary | ICD-10-CM

## 2010-08-05 DIAGNOSIS — I10 Essential (primary) hypertension: Secondary | ICD-10-CM

## 2010-08-05 MED ORDER — FUROSEMIDE 20 MG PO TABS: ORAL_TABLET | ORAL | Status: DC

## 2010-08-05 NOTE — Assessment & Plan Note (Signed)
Clinically euvolemic today.  Will monitor off of ACE and B Blocker.  Change the lasix to PRN.  Pt will weigh daily.

## 2010-08-05 NOTE — Progress Notes (Signed)
Subjective:    Patient ID: [name], male    DOB: 06-27-1945, 64 y.o.   MRN: [id number]  HPI   Mr.  [name] is a 65 yr old male who present today for follow up of his blood pressure.  Notes that his sbp was in the 80's.  He has lost 18 pounds since his last visit with his new vegetarian/vegan diet.  He stopped his bp meds on Friday.    History of CHF- He reports that he had a history of CHF exacerbation in 2008 with hospitalization.  His LVEF at that time was 20-25% in January 2008.  Later that summer his LVEF was up to 50%.  Now off of coreg, lasix, lisinopril, and klor-con.  Denies shortness of breath or lower extremity edema.     Review of Systems  See HPI  Past Medical History  Diagnosis Date  . Hypertension   . Routine general medical examination at a health care facility   . History of ETOH abuse   . Non-ischemic cardiomyopathy     Presumed secondary to Etoh and HTN  (Prev EF 20-25% -- improved to 50%)  . Anomalous right coronary artery     History   Social History  . Marital Status: Married    Spouse Name: [name]    Number of Children: 3  . Years of Education: N/A   Occupational History  . Retired Radiographer, therapeutic    Social History Main Topics  . Smoking status: Former Smoker -- 20 years    Quit date: 04/12/2002  . Smokeless tobacco: Never Used  . Alcohol Use: No     Quit ETOH 18 months  . Drug Use: No     history of marijuana use  . Sexually Active: Not on file   Other Topics Concern  . Not on file   Social History Narrative   2 sons and daughter1 son in [name]. Living with wife 43yrs [name]).  Use to live near [place].    No past surgical history on file.  Family History  Problem Relation Age of Onset  . Alcohol abuse Mother   . Heart disease Mother   . Alcohol abuse Father   . Diabetes Neg Hx     No Known Allergies  Current Outpatient Prescriptions on File Prior to Visit  Medication Sig Dispense Refill  . aspirin 81 MG EC tablet Take 81 mg by  mouth daily.        . Multiple Vitamins-Minerals (CENTRUM SILVER ULTRA MENS PO) Take 1 tablet by mouth daily.        . potassium chloride (KLOR-CON) 10 MEQ CR tablet Take 10 mEq by mouth daily. Once daily only on the days that you take lasix.      Marland Kitchen DISCONTD: carvedilol (COREG) 25 MG tablet Take 25 mg by mouth 2 (two) times daily with a meal.        . DISCONTD: furosemide (LASIX) 20 MG tablet Take 20 mg by mouth daily.        Marland Kitchen DISCONTD: HYDROcodone-acetaminophen (VICODIN) 5-500 MG per tablet Take 1 tablet by mouth. Every 4-6 hours as needed for wrist pain.       Marland Kitchen DISCONTD: lisinopril (PRINIVIL,ZESTRIL) 5 MG tablet Take 5 mg by mouth 2 (two) times daily.        Marland Kitchen DISCONTD: Omega-3 Fatty Acids (FISH OIL) 1000 MG CAPS Take 1 capsule by mouth 4 (four) times daily.        Marland Kitchen DISCONTD: traMADol (ULTRAM) 50  MG tablet Take 50 mg by mouth 3 (three) times daily as needed.          BP 126/80  Pulse 84  Temp(Src) 97.9 F (36.6 C) (Oral)  Resp 16  Ht 5' 9.02" (1.753 m)  Wt 194 lb 0.6 oz (88.016 kg)  BMI 28.64 kg/m2        Objective:   Physical Exam  Constitutional: He appears well-developed and well-nourished.  HENT:  Head: Normocephalic and atraumatic.  Eyes: Conjunctivae are normal. Pupils are equal, round, and reactive to light.  Cardiovascular: Normal rate and regular rhythm.  Exam reveals no friction rub.   No murmur heard. Pulmonary/Chest: Effort normal and breath sounds normal. No respiratory distress. He has no wheezes.  Musculoskeletal: He exhibits no edema.          Assessment & Plan:

## 2010-08-05 NOTE — Patient Instructions (Signed)
Weigh yourself daily, call if you gain more than 3 pounds overnight, or if you develop shortness of breath.

## 2010-08-05 NOTE — Assessment & Plan Note (Signed)
BP appears stable off of meds.  His weight loss has likely helped his BP control.  Plan to continue to monitor off of BP meds for now. Pt will continue to check his BP daily and is instructed to call us if his BP starts to rise.

## 2010-08-25 NOTE — Assessment & Plan Note (Signed)
[facility]                          CHRONIC HEART FAILURE NOTE   [name], [name]                          MRN:          [id number]  DATE:03/21/2008                            DOB:          1946-04-02    PRIMARY CARDIOLOGIST:  Rollene Rotunda, MD, [facility]   PRIMARY CARE PHYSICIAN:  Barbette Hair. Artist Pais, DO   [name] returns today for further followup of his congestive heart  failure.  I have not seen him here in the clinic since March 30, 2007.  [name] states he has been doing well since I last saw him.  He has been sober for 6 months, still attending Alcoholics Anonymous,  states he is pleased with the progress he has made.  He has a history of  congestive heart failure, which is secondary to nonischemic  cardiomyopathy with an EF previously 25%, now 55% by echocardiogram.  He  underwent a cardiac catheterization in January 2008 that showed normal  coronaries, acute CHF exacerbation in the setting of polysubstance  abuse, and poorly controlled hypertension.  [name] states he has  been participating in routine exercise.  He tries to walk 5 miles a day.  He has also returned to the gym and is doing sit-ups and push-ups, and  enjoying his retirement.  He has had some dental surgery since I last  saw him a year ago, but denies any symptoms suggestive of volume  overload.  Denies any orthopnea, PND, palpitations, presyncope, or  syncopal episodes, states compliance with his medications, still being  followed by Dr. Artist Pais over at [facility] office.  Recently had blood work  done on March 20, 2008; H and H of 15.3 and 45.9, potassium 4.1, BUN  and creatinine 16 and 1.2.  Liver function within normal limits and BNP  of 13.   PAST MEDICAL HISTORY:  1. Congestive heart failure secondary to nonischemic cardiomyopathy      with EF of 50%, previously as low as 20%.  2. Normal coronaries, status post cardiac catheterization in January      2008.  Cardiac  CT at that time revealed the patient to have RCA      that was anomalous and congenital running between the pulmonary      artery and the aorta.  3. Hypertension.  4. Alcoholism, currently in AA, has been sober for 6 months.  5. Dyslipidemia.  6. History of polysubstance abuse including alcohol, marijuana, and      tobacco.  7. History of noncompliance.  8. History of hyperglycemia.   REVIEW OF SYSTEMS:  As stated above.   CURRENT MEDICATIONS:  1. Carvedilol 25 mg b.i.d.  2. Klor-Con 10 mEq daily.  3. Furosemide 40 mg daily.  4. Aspirin 81 mg daily.  5. Lisinopril 20 mg daily.  6. Multivitamin daily.   PHYSICAL EXAMINATION:  VITAL SIGNS:  Weight 212 pounds, weight is down  12 pounds, blood pressure 110/80 with a heart rate of 64.  GENERAL:  [name] is in no acute distress.  NECK:  No signs of jugular vein distention  at 45-degree angle.  LUNGS:  Clear to auscultation bilaterally.  CARDIOVASCULAR:  An S1 with an S2.  Regular rate and rhythm.  ABDOMEN:  Soft and nontender.  Positive bowel sounds.  LOWER EXTREMITIES:  Without clubbing, cyanosis, or edema.  NEUROLOGICAL:  Alert and oriented x3.   IMPRESSION:  Congestive heart failure secondary to nonischemic  cardiomyopathy without signs of volume overload at this time.  Ejection  fraction now returned to normal.  [name] has done quite well over  the last year in regards to his heart failure.  We will continue to see  him yearly.  He knows to call if he has any signs or symptoms of heart  failure prior to his yearly appointment.      Dorian Pod, ACNP  Electronically Signed      Rollene Rotunda, MD, [facility]  Electronically Signed   MB/MedQ  DD: 03/21/2008  DT: 03/22/2008  Job #: [id number]

## 2010-08-25 NOTE — Assessment & Plan Note (Signed)
[facility]                          CHRONIC HEART FAILURE NOTE   ERICE, [name]                          MRN:          [id number]  DATE:03/30/2007                            DOB:          1946/04/11    PRIMARY CARDIOLOGIST:  Rollene Rotunda, MD, [facility].   PRIMARY CARE PHYSICIAN:  Barbette Hair. Artist Pais, DO.   [name] returns today for followup of his congestive heart failure  which is secondary to nonischemic cardiomyopathy(most likely alcohol and  hypertension induced) with an EF now of 50%, previously 20-25%.  Mr.  Francis has mild diastolic dysfunction and mild aortic regurgitation.  He  has been doing quite well.  He just returned from vacation in [place]  with his son.  He states he did not experience any volume overload  during that trip.  [name] also has a history of alcoholism since age  65.  He recently joined AA and has been diligent in trying to remain  alcohol free.  He states he has had a glass of wine with dinner on a  couple of occasions but for the most part has abstained from alcohol  use.  He speaks very highly of Alcoholics Anonymous and encourages  anyone to seek assistance there if needed.  He has not been as diligent  in his exercise program but states he is getting ready to step up his  exercise and start a walking program again.  He previously had lost  about 20 pounds just through daily walking and changing his eating  habits but states he has been a little more sluggish the last few months  with 2 vacation trips and states he will become more diligent with that  after the New Year.  He denies any episodes of chest discomfort,  orthopnea, PND.  He has been compliant with medications.  He states his  blood pressure has been well controlled when he checks it at home.   PAST MEDICAL HISTORY:  1. Congestive heart failure  secondary to nonischemic cardiomyopathy      with EF now 50% by echocardiogram previously as low as 20%.  2.  Normal coronaries status post cardiac catheterization in January      2008.  A cardiac CT at that time revealed the patient to have RCA      that was anomalous and congenital running between the pulmonary      artery and the aorta.  3. Hypertension.  4. History of noncompliance.  5. History of polysubstance abuse.  6. Dyslipidemia.  7. Hyperglycemia.  8. Alcoholism, currently in AA.   REVIEW OF SYSTEMS:  As stated above.   CURRENT MEDICATIONS:  1. Carvedilol 25 mg b.i.d.  2. Klor-Con 10 mEq daily.  3. Furosemide 40 mg daily.  4. Aspirin 81 daily.  5. Lisinopril 20 mg daily.  6. Multivitamin daily.   PHYSICAL EXAMINATION:  VITAL SIGNS:  Weight 221, blood pressure 138/90,  pulse is 71.  GENERAL:  [name] is in no acute distress.  No signs of jugular  venous distention at 45 degree angle.  LUNGS:  Clear to auscultation bilaterally.  CARDIOVASCULAR:  Reveals an S1 S2.  Regular rate and rhythm.  ABDOMEN:  Soft, nontender.  Positive bowel sounds.  LOWER EXTREMITIES:  Without clubbing, cyanosis, or edema.  NEUROLOGIC:  He is alert and oriented x3.   IMPRESSION:  Stable nonischemic cardiomyopathy without signs of volume  overload.   1. I have encouraged [name] to begin his exercise program again      and to be more in tune to his diet.  2. The patient is to continue with his alcohol cessation.  Encouraged      and supported the use of Alcoholics Anonymous.  3. The patient is due for routine cardiology visit with Dr. Antoine Poche.      The patient is to follow up with him in February.      Dorian Pod, ACNP  Electronically Signed      Rollene Rotunda, MD, [facility]  Electronically Signed   MB/MedQ  DD: 03/30/2007  DT: 03/30/2007  Job #: [id number]   cc:   Barbette Hair. Artist Pais, DO

## 2010-08-25 NOTE — Assessment & Plan Note (Signed)
[facility]                          CHRONIC HEART FAILURE NOTE   KAISEN, ACKERS                          MRN:          [id number]  DATE:09/29/2006                            DOB:          1945-04-27    CHRONIC HEART FAILURE CLINIC NOTE   [name] returns to the Heart Failure Clinic today for followup of his  congestive heart failure, which is secondary to nonischemic  cardiomyopathy with an EF currently of 20-25%.  I last saw Mr. Buzby in  May, at which time he had just recently returned from extended vacation.  His blood pressure was elevated and his weight was up at that time.  I  had given him an extra dose of his Lasix.  He was supposed to be taking  40 mg daily and also increased his lisinopril to 10 mg daily, and upped  his Coreg to 25 mg twice a day.  He has tolerated the change in  medications.  However, on further discussion with him today, he is only  taking half of his Lasix 40 mg daily, which is equivalent to 20 mg  tablet.  He states that he thought that was all he needed.  Previously,  he was also walking twice a day, up to 9 miles total.  However, since  returning from his vacation, he has not resumed his daily walks.  He  states he has been spending a lot of time in his garden, preparing it  for the summer, and has not returned to his previous regimen.  He denies  any shortness of breath, orthopnea, or PND.  He does not feel the 2  pound weight gain, in addition to the 4 pound weight gain in May, is  secondary to fluid, as his diet has not been consistent either since he  was traveling.  He is trying to get back to a healthier diet, although  he is not adding any salt to his meals.  He is using the Mrs. Dash.  He  states he checks his blood pressure at home and it runs 130/70s.  It has  never run as high at home as it does when he comes in here.   PAST MEDICAL HISTORY:  1. Includes congestive heart failure secondary to  nonischemic      cardiomyopathy with an EF currently of 20-25% by echocardiogram in      January of this year.  2. Normal coronary status post cardiac catheterization in January of      2008.  At that time, in the setting of acute pulmonary edema.      Cardiac CT the patient found to have RCA that was anomalous and      congenital running between the pulmonary artery and aorta.  3. Hypertension.  4. History of noncompliance.  5. History of polysubstance abuse, including alcohol, tobacco, and      marijuana with complete resolution of all substances since January.  6. Dyslipidemia.  7. Hyperglycemia being evaluated by Dr. Artist Pais.   REVIEW OF SYSTEMS:  As stated above.   CURRENT MEDICATIONS:  1. Aspirin 81 mg daily.  2. Lasix the patient is supposed to be taking 40 mg daily he is only      taking 20.  3. Klor-Con 10 mEq daily.  4. Coreg 25 mg b.i.d.  5. Lisinopril 10 mg daily.   PHYSICAL EXAM:  Weight 213 pounds.  Weight is up 2 pounds from May.  Blood pressure 142/94.  Pulse 63.  Mr. Emmanuell is in no acute distress.  He has no jugular venous distension at 45 degree angle.  LUNGS:  Clear to auscultation bilaterally.  CARDIOVASCULAR:  Reveals an S1, S2 with regular rate and rhythm.  ABDOMEN:  Soft, nontender.  Positive bowel sounds.  LOWER EXTREMITIES:  Without cyanosis, clubbing, or edema.  SKIN:  Warm and dry.  NEUROLOGIC:  The patient is alert and oriented x3.   IMPRESSION:  Heart failure in the setting of nonischemic cardiomyopathy,  stable at this time.  The patient once again is hypertensive.  I am  going to have him increase his Lasix back to the 40 mg daily, as he  should be taking.  Continue his lisinopril at 10 mg and his Coreg at 25  mg b.i.d.  I am going to have him schedule the patient for repeat 2D  echocardiogram for reevaluation of his ejection fraction.  We will check  lab work today as I increased his lisinopril dose last visit.   FOLLOWUP:  I will have the patient  see Dr. Antoine Poche for routine  cardiology visit.   PRIMARY CARE PHYSICIAN:  Dr. Thomos Lemons.   We will make sure Dr. Artist Pais receives a copy of lab work also.   PRIMARY CARDIOLOGIST:  Dr. Rollene Rotunda.      Dorian Pod, ACNP  Electronically Signed      Bevelyn Buckles. Bensimhon, MD  Electronically Signed   MB/MedQ  DD: 09/29/2006  DT: 09/29/2006  Job #: [id number]

## 2010-08-25 NOTE — Letter (Signed)
November 01, 2006     RE:  NOMAR, BROAD  MRN:  [id number]  /  DOB:  1946-02-02   Dear Sir/Madam:   This letter is regards to [name]. He has been under our care  for cardiac condition with heart failure. This condition began in early  January, necessitating hospitalization. He has since been seen  frequently in our heart failure clinic. We expected his period of  disability from work will last through the year until probably January  of 2009. We will be seeing him frequently in our clinic. If you have any  questions, please do not hesitate to call our office at [phone].    Sincerely,      Rollene Rotunda, MD, [facility]  Electronically Signed    JH/MedQ  DD: 11/01/2006  DT: 11/02/2006  Job #: [id number]

## 2010-08-25 NOTE — Assessment & Plan Note (Signed)
[facility]                         GASTROENTEROLOGY OFFICE NOTE   [name], [name]                          MRN:          [id number]  DATE:10/17/2006                            DOB:          1945/05/27    REFERRING PHYSICIAN:  Barbette Hair. Artist Pais, DO   REASON FOR REFERRAL:  Dr. Artist Pais asked me to evaluate [name] in  consultation regarding colorectal cancer screening with colonoscopy.   HISTORY OF PRESENT ILLNESS:  [name] is a very pleasant 65 year old  man who has never had troubles with his bowels that he knows of.  Specifically, he has never had significant constipation, diarrhea, or  rectal bleeding.  He was recently diagnosed with nonischemic  cardiomyopathy, with an ejection fraction in the 20%-25% range based on  an angiogram 5-6 months ago.   REVIEW OF SYSTEMS:  Notable for a 30-pound weight loss in the past year  or so.  Much of this I suspect is water weight since beginning on  cardiac medicines and diuretics.  The rest of his review of systems is  essentially normal and is available on his nursing intake sheet.   PAST MEDICAL HISTORY:  1. CHF, as above.  2. Hypertension.   CURRENT MEDICATIONS:  1. Carvedilol 25 mg twice daily.  2. Klor-Con.  3. Lasix.  4. Aspirin.  5. Lisinopril.   ALLERGIES:  No known drug allergies.   SOCIAL HISTORY:  Married, with three children.  On extended sick leave  from the [employer].  Will be retiring at the end of his sick  leave, with full pension.  Nonsmoker.  Drinks wine with dinners  occasionally.  Former polysubstance abuser, although most of this was  quit several months ago.   FAMILY HISTORY:  No colon cancer or colon polyps in the family.   PHYSICAL EXAMINATION:  VITAL SIGNS:  Height 5 feet 10 inches, weight 214  pounds, blood pressure 138/90, pulse 68.  CONSTITUTIONAL:  Generally well appearing.  NEUROLOGIC:  Alert and oriented x3.  HEENT:  Eyes:  Extraocular movements intact.   Mouth:  Oropharynx moist,  no lesions.  NECK:  Supple.  No lymphadenopathy.  CARDIOVASCULAR:  Heart regular rate and rhythm.  LUNGS:  Clear to auscultation bilaterally.  ABDOMEN:  Soft, nontender, nondistended.  Normal bowel sounds.  EXTREMITIES:  No lower extremity edema.  SKIN:  No rashes or lesions on visible extremities.   ASSESSMENT AND PLAN:  A 65 year old man at routine risk for colorectal  cancer, at elevated risk for colonoscopy complications, given  significant CHF.   I see no reason for any further blood tests or imaging studies, but we  will arrange for [name] to have a colonoscopy performed at his  soonest convenience.     Rachael Fee, MD  Electronically Signed    DPJ/MedQ  DD: 10/17/2006  DT: 10/17/2006  Job #: [id number]   cc:   Barbette Hair. Artist Pais, DO

## 2010-08-25 NOTE — Letter (Signed)
March 30, 2007     RE:  Alan, Francis  MRN:  [id number]  /  DOB:  1945/09/15   Dear Milford Cage or Madam:   This letter is in regards to [name].  He has been under my care  for cardiac condition with congestive heart failure.  This condition  began in early January of 2008 necessitating hospitalization at that  time.  Since that time I have followed him closely in my heart failure  clinic.  I expect his period of disability from work to continue through  until mid year of 2009.  I will continue to see him frequently in the  clinic for close observation/re-evaluation.  If you have any questions,  please do not hesitate to call me at the office [phone].    Sincerely,      Dorian Pod, ACNP  Electronically Signed    MB/MedQ  DD: 03/30/2007  DT: 03/31/2007  Job #: [id number]

## 2010-08-25 NOTE — Assessment & Plan Note (Signed)
[facility]                          CHRONIC HEART FAILURE NOTE   [name], [name]                          MRN:          [id number]  DATE:11/08/2006                            DOB:          [birth year]    [name] returns today for followup of is congestive heart failure  which is secondary to nonischemic cardiomyopathy with the EF previously  20-25%.  Recently obtained 2D echocardiogram that showed improvement in  ejection fraction to 50%.  The patient had mild diastolic dysfunction  and mild aortic regurgitation.  [name] states he has been doing  quite well.  When I last saw him in June I had asked him to resume his  exercise program and modify his diet as he had gained approximately 8  pounds since the Spring and had stopped exercising when he went on  vacation.  He states he has started his walking program again.  He is  walking 2 miles 7 days a week, is doing better with his diet, has been  working on his garden this summer.  Denies any presyncope, syncope,  lightheadedness, dizziness, orthopnea or PND.  Checks his blood pressure  daily at home, he is getting readings 120's/80's, the highest he has  gotten is 132/88.  He states compliance with his medication.   PAST MEDICAL HISTORY:  1. Congestive heart failure secondary to nonischemic cardiomyopathy      with an EF previously 20-25% with improvement in EF now to 50% by      echocardiogram.  2. Normal coronaries status post cardiac catheterization January of      2008.  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  3. Hypertension.  4. History of noncompliance.  5. History of polysubstance abuse, including alcohol, tobacco and      marijuana, with complete resolution of substances in January of      2008.  6. Dyslipidemia.  7. Hyperglycemia.   REVIEW OF SYSTEMS:  As stated  above.   CURRENT MEDICATIONS:  1. Carvedilol 25 mg b.i.d.  2. Klor-Con 10 mEq daily.  3. Furosemide 40 mg daily.  4. Aspirin 81 mg daily.  5. Lisinopril 10 mg daily.  6. Multivitamin daily.   PHYSICAL EXAM:  Weight 214 pounds, blood pressure 122/84 with a heart  rate of 70.  [name] is in no acute distress.  No jugular vein  distention at 45 degree angle.  LUNGS:  Clear to auscultation bilaterally.  CARDIOVASCULAR EXAM:  S1, S2, regular rate and rhythm.  ABDOMEN:  Soft, nontender, positive bowel sounds.  LOWER EXTREMITIES:  Without clubbing, cyanosis or edema.  NEUROLOGICALLY:  Patient alert and oriented x3.   IMPRESSION:  Stable nonischemic cardiomyopathy/congestive heart failure  without signs of volume overload at this time.  Patient with much  improvement in ejection fraction by echocardiogram, now at 50% EF.  Will  continue current medications.  Would consider increasing lisinopril to  20  mg daily if needed.      Dorian Pod, ACNP  Electronically Signed      Bevelyn Buckles. Bensimhon, MD  Electronically Signed   MB/MedQ  DD: 11/08/2006  DT: 11/08/2006  Job #: [id number]

## 2010-08-25 NOTE — Assessment & Plan Note (Signed)
[facility]                          CHRONIC HEART FAILURE NOTE   [name], [name]                          MRN:          [id number]  DATE:08/30/2006                            DOB:          [birth year]    [name] returns today for followup of his congestive heart failure,  which is secondary to nonischemic cardiomyopathy with EF currently 20%  to 25%.  [name] has been doing quite well.  He recently retired from  the post office system and has diligently been working to improve his  health; however, he states he had a small setback over the last few  weeks; his son, who is in college in [place] just completed the year and  has been moving back home and [name] has been making 2 trips to  [place], back and forth to return his son's belongings home.  He states  he has not been very prudent in his diet, has had to eat out a lot over  the last 2 weeks and has gained 4 pounds.  He has not been doing his  daily walking that he routinely does either.  He does not feel that the  weight gain is secondary to fluid retention.  No signs of peripheral  edema.  Denies any coughing, wheezing, orthopnea or PND.  He states now  that everything has settled down, he will go back to his routine  activities.  He previously walked 3 miles every morning with his dog.  He has slowly bumped this up to 5 miles every morning now.  He also has  limited the amount of pork he is eating and has completely discontinued  the red meat in his diet.  He is eating fish 3 times a week  and is  trying to incorporate more fruit and vegetables into his diet.  Overall,  he is very pleased with his progress, has tolerated titration of his  beta blocker over the last few visits without any problems.   PAST MEDICAL HISTORY:  1. Congestive heart failure secondary to underlying ischemic      cardiomyopathy with EF of 20% by echocardiogram in January of this      year.  2. Normal  coronaries, status post cardiac catheterization in January,      at time of acute pulmonary edema.  The patient also underwent a      cardiac CT at that time; the patient was found to have an RCA that      was anomalous and congenital, running between the pulmonary artery      and aorta.  3. Hypertension.  4. History of noncompliance.  5. History of polysubstance abuse including alcohol, tobacco and THC.  6. Dyslipidemia.  7. Hyperglycemia, being evaluated by Dr. Artist Pais.   REVIEW OF SYSTEMS:  As stated above.   CURRENT MEDICATIONS:  1. Lisinopril 5 mg daily.  2. Aspirin 81 mg daily.  3. Klor-Con 10 mEq daily.  4. Lasix 40 mg daily.  5. Coreg 18.75 mg b.i.d.   PHYSICAL EXAMINATION:  Weight 211 pounds; weight is up  4 pounds.  Blood  pressure 139/92 with a pulse of 74.  [name] is in no acute distress, a very pleasant gentleman.  JVD is around 7-8 at a 45-degree angle.  LUNGS:  Clear to auscultation.  CARDIOVASCULAR:  Exam reveals an S1 and S2, regular rate and rhythm.  ABDOMEN:  Soft and nontender.  Positive bowel sounds.  EXTREMITIES:  Lower extremities without clubbing, cyanosis, or edema.  NEUROLOGIC:  Patient alert and oriented x3.   IMPRESSION:  Stable heart failure secondary to nonischemic  cardiomyopathy, patient still hypotension at this time.  We are going to  increase his Coreg to 25 mg p.o. b.i.d., increase the patient's  lisinopril to 10 mg daily, have him take an additional 40 mg of Lasix  today.  [name] currently maintains a class I [employer] [place] [employer]  Association symptoms, is ambulating up to 5 miles a day without any  discomfort; however, he is still at risk for sudden cardiac death  secondary to his low ejection fraction.  I would plan on repeating his  echocardiogram in June for further evaluation.  His primary cardiologist  is Dr. Antoine Poche.      Dorian Pod, ACNP  Electronically Signed      Rollene Rotunda, MD, [facility]  Electronically Signed    MB/MedQ  DD: 08/30/2006  DT: 08/30/2006  Job #: [id number]   cc:   Barbette Hair. Artist Pais, DO

## 2010-08-25 NOTE — Assessment & Plan Note (Signed)
[facility]                          CHRONIC HEART FAILURE NOTE   PINKNEY, VENARD                          MRN:          [id number]  DATE:01/09/2007                            DOB:          27-May-1945    Mr. Nies returns today for followup of his congestive heart failure  which is secondary to a non ischemic cardiomyopathy, with an ejection  fraction previously 20% to 25%, most recent echocardiogram showing  improvement to 50%. Mr. Franze has mild diastolic dysfunction and mild  aortic regurgitation. He has been doing quite well. He has resumed his  exercise program and has been walking 7 days a week. He also has come to  accept that fact that he needs assistance with his alcohol use and has  joined [employer] and states this has been a very positive  step for him. He otherwise denies any symptoms suggestive of volume  overload, pre syncope, or syncopal episodes, lightheadedness, or  dizziness. Blood pressure at home continues to run 120s to 80s. Patient  stays compliant with his medications.   PAST MEDICAL HISTORY:  1. Congestive heart failure, secondary to non ischemic cardiomyopathy      with ejection fraction previously 20% to 25%, now at 50% by      echocardiogram.  2. Normal coronaries status post cardiac catheterization in January      2008.  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  3. Hypertension.  4. History of noncompliance.  5. History of polysubstance abuse.  6. Dyslipidemia.  7. Hyperglycemia.   REVIEW OF SYSTEMS:  As stated above.   CURRENT MEDICATIONS:  Include:  1. Carvedilol 25 mg b.i.d.  2. Klor-con 10 mEq daily.  3. Furosemide 40 mg daily.  4. Aspirin 81 mg daily.  5. Lisinopril 10 mg daily.  6. Multivitamin daily.   PHYSICAL EXAMINATION:  Weight 222 pounds, patient's weight is up 8  pounds  since July, blood pressure 129/82 with a heart rate of 73. Mr.  Fero is in no acute distress.  No signs of jugular vein distension at 45 degree angle.  LUNGS: Clear to auscultation bilaterally.  CARDIOVASCULAR EXAM: Reveals an S1 and S2, regular rate and rhythm.  ABDOMEN: Soft, nontender. Positive bowel sounds, mildly obese.  LOWER EXTREMITIES: Without clubbing, cyanosis, or edema.  NEUROLOGICALLY: Patient alert and oriented x3.   IMPRESSION:  Stable non ischemic cardiomyopathy without signs of volume  overload. Patient with increased weight gain, however appears to be diet  related. I am going to increase his lisinopril to 10 mg b.i.d., have  BMET, and BNP checked on Thursday. Patient to continue with ETOH  cessation, encouraged, and support the use of Alcoholics Anonymous. We  will see Mr. Halt back in 2 months for reevaluation or if he has any  problems.      Dorian Pod, ACNP  Electronically Signed      Rollene Rotunda, MD, [facility]  Electronically Signed   MB/MedQ  DD: 01/09/2007  DT: 01/09/2007  Job #: [id number]

## 2010-08-28 NOTE — Assessment & Plan Note (Signed)
[facility]                            CARDIOLOGY OFFICE NOTE   [name], [name]                          MRN:          [id number]  DATE:05/10/2006                            DOB:          [birth year]    May 10, 2006   PRIMARY CARE PHYSICIAN:  None.   REASON FOR PRESENTATION:  Evaluate patient with cardiomyopathy.   HISTORY OF PRESENT ILLNESS:  Patient presents for follow-up of his newly  diagnosed cardiomyopathy.  He was admitted with acute pulmonary edema on  April 23, 2006.  His EF was 20-25% at that time.  He had a cardiac  catheterization demonstrating an anomalous takeoff of his right coronary  artery.  He had nonobstructive proximal LAD plaque.  It was felt that  his cardiomyopathy was most likely related to ETOH.  He recovered nicely  from this.  Of note, CT done during the hospitalization did demonstrate  that his rapid atrial fibrillation coursed between the great vessels.  However, this is not thought to be causing any symptoms at this point.  We did discuss this at length.   Since that time the patient has done well.  He says his breathing is  back to baseline.  He has been watching salt.  He has avoided alcohol  completely.  He denies any shortness of breath and has had no PND or  orthopnea.  He has had no palpitations, presyncope or syncope.  He  denies any chest discomfort, neck discomfort, arm discomfort, activity  induced nausea or vomiting, excessive diaphoresis.   PAST MEDICAL HISTORY:  1. Nonischemic cardiomyopathy.  2. Hypertension.  3. Previous ETOH use.  4. Previous tobacco use.  5. Borderline glucose intolerance.  6. Anomalous right coronary artery.   ALLERGIES:  NO KNOWN DRUG ALLERGIES.   MEDICATIONS:  1. Lisinopril 5 mg daily.  2. Aspirin 81 mg daily.  3. Carvedilol 3.125 mg b.i.d.  4. Protonix 40 mg daily.  5. Furosemide 40 mg daily.   REVIEW OF SYSTEMS:  As stated in the HPI and otherwise negative  for  other systems.   PHYSICAL EXAMINATION:  GENERAL APPEARANCE:  Patient is in no distress.  VITAL SIGNS:  Weight 210 pounds, body mass index 30, blood pressure  127/85, heart rate 71 and regular.  HEENT:  Eye lids unremarkable.  Pupils are equal, round and reactive to  light.  Fundi not visualized.  Oral mucosa unremarkable.  NECK:  No jugular venous distension at 45 degrees.  Carotid upstroke  brisk and symmetric, no bruit.  No thyromegaly.  LYMPHATICS:  No cervical, axillary or inguinal adenopathy.  LUNGS:  Clear to auscultation bilaterally.  BACK:  No costovertebral angle tenderness.  CHEST:  Unremarkable.  CARDIOVASCULAR:  PMI not displaced or sustained.  S1 and S2 within  normal limits.  No S3, no S4, no murmurs.  ABDOMEN:  Obese, positive bowel sounds, normal in frequency and pitch,  no bruits, no guarding, no midline pulsatile mass, no hepatomegaly,  splenomegaly.  SKIN:  No rashes, no nodules.  EXTREMITIES:  There are 2+ pulses throughout, no edema,  no cyanosis, no  clubbing.  NEUROLOGIC:  Oriented to person, place and time.  Cranial nerves II-XII  grossly intact.  Motor grossly intact.   EKG with sinus rhythm, rate 71, left axis deviation, left anterior  fascicular block, poor anterior R-wave progression, lateral T-wave  changes consistent with left ventricular hypertrophy in this situation.   ASSESSMENT/PLAN:  1. Cardiomyopathy.  The patient has a nonischemic cardiomyopathy.      Today I took quite a bit of time to educate him and we gave him an      education packet.  He will be followed in our heart failure clinic.      We are going to up-titrate his Coreg to 6.25 mg b.i.d.  He will      avoid salt and alcohol in particular.  We will reduce his      furosemide to 20 mg daily.  2. Anomalous right coronary.  There is no indication at this point      that this should be surgically reimplanted.  I think the risks of      that procedure outweigh the benefit as he is not  having any      abnormalities related to this.  There was theoretically concern for      sudden cardiac death with this but the patient and his wife and I      have discussed this.  3. Obesity.  Understands he needs to lose weight with diet and      exercise.  He understands the need to follow up with primary care      doctor.  4. Adenopathy.  The patient had some adenopathy on his chest CT.  A      suggestion is a 3-4 month follow-up.  It is probably related to      edema.  He can have follow-up CT ordered through this office.  5. Follow-up:  I will see him in the heart failure clinic in two weeks      and then I would like to see him back in about 2-3 months.     Rollene Rotunda, MD, [facility]  Electronically Signed    JH/MedQ  DD: 05/10/2006  DT: 05/10/2006  Job #: [id number]

## 2010-08-28 NOTE — Cardiovascular Report (Signed)
[name], [name]                 ACCOUNT NO.:  [id number]   MEDICAL RECORD NO.:  [id number]          PATIENT TYPE:  INP   LOCATION:  2913                         FACILITY:  [facility]   PHYSICIAN:  Rollene Rotunda, MD, FACCDATE OF BIRTH:  Mar 05, 1946   DATE OF PROCEDURE:  04/26/2006  DATE OF DISCHARGE:                            CARDIAC CATHETERIZATION   PRIMARY CARE PHYSICIAN:  None.   PROCEDURE:  Left and right heart catheterization/coronary arteriography.   INDICATIONS:  Evaluate patient with new-onset acute pulmonary edema.  Echo has demonstrated a global hypokinesis with an EF of 25% and some  mild pulmonary hypertension.   PROCEDURAL NOTE:  Left heart catheterization was performed via right  femoral artery, right heart catheterization performed via right femoral  vein.  Both vessels were cannulated using an anterior wall puncture.  A  #6-French arterial sheath and a #7-French venous sheath were inserted  via the modified Seldinger technique.  Preformed Judkins and a pigtail  catheter were utilized.  The patient tolerated the procedure well and  left the lab in stable condition.   RESULTS:   HEMODYNAMICS:  RA mean 10, RV 53/8, PA 51/22 with a mean of 35,  pulmonary capillary pressure mean 16, LV 122/37, AO 125/60.  Cardiac  output/cardiac index (Fick)  4.3/1.9.   CORONARIES:  Left main was normal.  The LAD had proximal plaque.  There  was ostial 25% stenosis.  There was mid moderate-sized diagonal which  was normal.  The circumflex in the AV groove was normal.  The right  ramus intermediate was large and normal.  OM-1 was normal and moderate-  sized.  OM-2 was normal and moderate-sized.  There were three small  posterolaterals, which were normal.  The right coronary artery was a  large, dominant vessel.  It was anomalous, arising from the left main.  It was normal throughout its course.  The PDA was small and normal.   LEFT VENTRICULOGRAM:  The left ventriculogram was  obtained in the RAO  projection.  The EF was 25% with global hypokinesis.   AORTIC ROOT:  An aortic root injection was obtained secondary to the  anomalous coronary.  This demonstrated again the anomalous coronary and  no residual vessel from the right coronary cusp.  The root was otherwise  free of disease.      Rollene Rotunda, MD, [facility]  Electronically Signed     JH/MEDQ  D:  04/26/2006  T:  04/27/2006  Job:  [id number]

## 2010-08-28 NOTE — Assessment & Plan Note (Signed)
[facility]                          CHRONIC HEART FAILURE NOTE   LENNIN, OSMOND                          MRN:          [id number]  DATE:08/04/2006                            DOB:          1946/03/12    Mr. Hinch returns today for followup of his congestive heart failure,  which is secondary to non-ischemic cardiomyopathy with EF currently 20%  to 25%.  Mr. Luckman was recently diagnosed in the hospital setting  secondary to acute pulmonary edema, requiring intubation.  I saw Mr.  Grivas last in March of this year, at which time I titrated his  Carvedilol up to 12.5 mg b.i.d.  The patient states that he has  tolerated that dose without any presyncope, syncopal, light headedness  or dizziness.  He continues to maintain a very active lifestyle.  He  walks up to 3 miles a day.  Previously had a history of alcohol abuse,  currently in remission.  When I initially saw Mr. Mcfarlane he did not have  a primary care physician.  I referred him to Dr. Artist Pais to establish care.  He saw Dr. Artist Pais as a new patient in March.  Mr. Bellamy states he has been  doing quite well. He just returned from vacationing with his wife up in  [place], [place].  States he did not have any problems on that trip.  Denies any shortness of breath or dyspnea on exertion.  He is concerned  that his blood pressure is elevated today.  He states when he checks it  at home it runs 130s over 80s.  He also has decided to retire.  He does  not plan on returning to work.  He wants to spend more time increasing  his activity level and maintaining his health.   PAST MEDICAL HISTORY:  Includes:  1. congestive heart failure secondary to non-ischemic cardiomyopathy      with an EF of 20% to 25% by echocardiogram in January of this year.  2. Normal coronary arteries status post cardiac catheterization in      January at time of acute pulmonary edema.  The patient also      underwent a cardiac CT at that  time.  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  3. Hypertension.  4. History of non-compliance.  5. History of polysubstance abuse with alcohol, tobacco and THC.  6. Dyslipidemia.  7. Hyperglycemia.  Being evaluated by Dr. Artist Pais.   REVIEW OF SYSTEMS:  As stated above.   CURRENT MEDICATIONS:  Include:  1. Lisinopril 5 mg daily.  2. Aspirin 81 mg.  3. Potassium 10 mEq daily.  4. Lasix 20 mg daily.  5. Coreg 12.5 mg b.i.d.   PHYSICAL EXAMINATION:  Today's weight 207.  Blood pressure 152/95,  manual blood pressure 144/90 with a heart rate of 67.  Mr. Yera is in no acute distress.  A very pleasant, cooperative, 65-  year-old [place] American gentleman.  He has JVD __________  at a 45 degree angle.  LUNGS:  Are clear to auscultation.  CARDIOVASCULAR:  S1 and S2.  Regular rate and rhythm.  ABDOMEN:  Soft, nontender, positive bowel sounds.  LOWER EXTREMITIES:  Without clubbing, cyanosis or edema.  NEUROLOGICAL:  Alert and oriented x3. Movement on extremities x4.   IMPRESSION:  Stable non-ischemic cardiomyopathy/heart failure.  Ejection  fraction currently 20% to 25% in the process of titrating patient's beta  blocker therapy. Cardiomyopathy most likely secondary to uncontrolled  hypertension. History of ETOH abuse which is currently in remission.  We  will increase patient's Carvedilol to two 18.75 mg p.o. b.i.d.  I have  asked the patient to increase the evening dose for 3 days and then if  tolerates, increase the a.m. dose also.  I will see him back in 1 month  for further titration of medication.  Hopefully we will plan on  repeating his echocardiogram around late June, early July anticipating  improvement of patient's ejection fraction. He currently maintains Class  II New York Heart Association symptoms.  However, he is at risk for  sudden cardiac death secondary to low ejection fraction.       Dorian Pod, ACNP  Electronically Signed      Rollene Rotunda, MD, [facility]  Electronically Signed   MB/MedQ  DD: 08/04/2006  DT: 08/04/2006  Job #: [id number]   cc:   Barbette Hair. Artist Pais, DO

## 2010-08-28 NOTE — Consult Note (Signed)
[name], [name]                 ACCOUNT NO.:  [id number]   MEDICAL RECORD NO.:  [id number]          PATIENT TYPE:  INP   LOCATION:  2913                         FACILITY:  [facility]   PHYSICIAN:  Rollene Rotunda, MD, FACCDATE OF BIRTH:  November 22, 1945   DATE OF CONSULTATION:  04/23/2006  DATE OF DISCHARGE:                                 CONSULTATION   PRIMARY PHYSICIAN:  None.   CARDIOPULMONARY:  Dr. Craige Cotta.   REASON FOR CONSULTATION:  Patient with acute respiratory distress.   HISTORY OF PRESENT ILLNESS:  The patient is a 65 year old American  gentleman without prior cardiac history.  He moved here from [place] a  while ago and has not had any medical care since that time.  His wife  says he does not see doctors.  However, he does not typically complain  of things.  In particular, he has not had any shortness of breath,  dyspnea on exertion, PND, palpitation, pre-syncope or syncope.  He has  not had any chest pain.   Thursday morning, he did have an episode of nausea and vomiting.  He  went to work Thursday p.m.  Friday, he was doing relatively well.  He  did his usual activity including some yard work without problems.  This  a.m., he woke up with some chest congestion.  He once had to get some  fresh air.  His wife heard him pounding on the window outside, claming  that he could not breathe.  She tried to bring him in the house, but he  would not let her.  He called 9-1-1.  Unfortunately, at this point I do  not have the EMS report, though it is reported from the ER staff that he  was treated with BiPAP and Lasix.  There was also some report from his  family he was rubbing his chest, prior to transport.  He was brought  into the emergency room, where he was emergently intubated.   Chest x-ray shows bilateral infiltrates.  First point care markers  include troponin of 0.06, otherwise unremarkable.  He has had excellent  diuresis with Lasix and is now sedated on the ventilator.  EKG  was  nonacute.   PAST MEDICAL HISTORY:  Hypertension, excessive alcohol use.   PAST SURGICAL HISTORY:  None.   ALLERGIES:  NONE.   MEDICATIONS:  Aspirin 81 mg daily.   SOCIAL HISTORY:  The patient is married.  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  He moved from [place] 2-1/2 years  ago.  He drinks wine with the nightly.  He smokes marijuana.  He quit  smoking cigarettes 6 months ago.   FAMILY HISTORY:  Is contributory for his mother dying in her 32s of  coronary disease, and his father dying in his 53s of coronary disease.   REVIEW OF SYSTEMS:  As stated in the HPI and negative for other systems  per the family.   PHYSICAL EXAMINATION:  GENERAL:  The patient is intubated and sedated.  VITAL SIGNS:  Blood pressure 122/81, heart rate 96 and regular, 96%  saturation on a ventilator.  HEENT:  Eyes unremarkable, pupils equal, round, reactive to light, fundi  not visualized, oral mucosa intubated.  NECK:  The patient lying flat.  His carotid upstrokes are brisk and  symmetric.  He has no obvious bruits.  Lymphatics:  No cervical,  axillary, inguinal adenopathy.  LUNGS:  No wheezing or crackles.  BACK:  Unremarkable.  HEART:  PMI not displaced or sustained, S1 and S2 within normal.  No S3,  no S4, no murmurs.  ABDOMEN:  Obese, positive bowel sounds, normal in frequency and pitch,  no bruits, no rebound, no guarding.  No midline or pulsatile masses, no  hepatomegaly, no splenomegaly.  SKIN:  No rashes, no nodules.  EXTREMITIES:  2+ pulses throughout, edema. No cyanosis or clubbing.  NEURO:  Oriented to person, place and time, cranial nerves II-XII  grossly intact, motor grossly intact.   EKG:  Sinus rhythm, rate 93, left axis deviation, left atrial  enlargement, poor anterior R-wave progression, QT prolongation.   LABS:  WBC 0.9, hemoglobin 15.3, sodium 139, potassium 3.6, BUN 17,  creatinine 1.3, BNP 217.   ASSESSMENT/PLAN:  1. Acute respiratory  distress.  This is most likely an acute coronary      event.  There is no evidence that it was a hypertensive urgency.      He has probable risk factors for atherosclerotic coronary disease.      He does have drusen on his eyelids.  At this point, I agree with      maintaining him on a ventilator with diuresis.  Hopefully, he will      be easily extubatable.  Will use Lovenox, and I would suggest a      full dose.  He will be managed with nitroglycerin.  He will be      continued on aspirin.  I agree with beta blocker.  Continue to      cycle enzymes.  He will most likely need a right and left heart      catheterization.  2. Risk reduction.  Check a lipid profile and TSH.  3. Hypertension. Will continue medications as listed above.  4. Polysubstance abuse.  He will be extensively educated.      Rollene Rotunda, MD, [facility]  Electronically Signed     JH/MEDQ  D:  04/23/2006  T:  04/24/2006  Job:  [id number]

## 2010-08-28 NOTE — Assessment & Plan Note (Signed)
[facility]                          CHRONIC HEART FAILURE NOTE   ALEX, LEAHY                          MRN:          [id number]  DATE:06/20/2006                            DOB:          10/18/1945    Mr. Tinnon returns today for follow up regarding his congestive heart  failure, which is secondary to non ischemic cardiomyopathy with an  ejection fraction of 20%-25%.  Diagnosed in the stay of a recent  hospitalization for acute pulmonary edema requiring intubation.  Mr.  Pann states he has been doing well.  He currently is up to walking 5-6  miles a day without shortness of breath or chest discomfort.  He denies  any episodes of orthopnea or paroxysmal nocturnal dyspnea, no syncope or  presyncopal episodes, tolerating medications without any problems.  He  continues to up stain from alcohol along with tobacco or marijuana  products.  He states he feels much better than he has in a long time.  He actually plans on starting a small garden this year.   PAST MEDICAL HISTORY:  1. acute pulmonary edema/congestive heart failure secondary to non      ischemic cardiomyopathy with a ejection fraction of 20%-25%.  2. Hypertension.  3. Previous polysubstance abuse including EtOH, tobacco and marijuana.  4. 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  5. Hyperglycemia followed by Dr. Artist Pais.   REVIEW OF SYSTEMS:  As stated above, otherwise negative.   CURRENT MEDICATIONS:  1. Lisinopril 5 mg daily.  2. aspirin 81 mg.  3. Klon-Con 10 mL equivalents daily.  4. Lasix 20 mg daily.  5. Coreg 9.375 mg b.i.d.   PHYSICAL EXAMINATION:  Weight 206 pounds, blood pressure initially  145/93, recheck manually by myself 138/80 with a pulse of 73.  Mr. Shell is in  no acute distress.  No jugular vein distension at a 45 degree angle.  LUNGS:  Clear to auscultation bilaterally.  CARDIOVASCULAR:  Reveals a regular rate and rhythm.  ABDOMEN:  Soft, nontender with positive bowel sounds.  LOWER EXTREMITIES:  Without clubbing, cyanosis or edema.   IMPRESSION:  Stable heart failure at this time.  Continue currently  medications with the exception of his Carvedilol, which I am going to  increase to 12.5 mg b.i.d.  Otherwise, the  patient has responded well to medication and education and I will see  the patient back in a month for further titration of his Carvedilol.      Dorian Pod, ACNP  Electronically Signed      Rollene Rotunda, MD, [facility]  Electronically Signed   MB/MedQ  DD: 06/20/2006  DT: 06/20/2006  Job #: [id number]

## 2010-08-28 NOTE — Assessment & Plan Note (Signed)
[facility]                           PRIMARY CARE OFFICE NOTE   KHARON, HIXON                          MRN:          [id number]  DATE:06/16/2006                            DOB:          Mar 04, 1946    CHIEF COMPLAINT:  New patient to practice, referred by Dr. Rollene Rotunda.   HISTORY OF PRESENT ILLNESS:  Patient is a 65 year old African-American  male here to establish primary care.  He was recently hospitalized in  early January of 2008, secondary to acute respiratory distress.  The  patient reports allergy-like symptoms with coughing prior to the  incident, but patient was found to be in congestive heart failure, and  was ultimately diagnosed with nonischemic cardiomyopathy.  He underwent  left heart and right heart cardiac catheterization.  His EF was noted to  be approximately 25%.  There was no significant obstructive coronary  disease.  His cardiomyopathy was felt to be secondary to his long term  alcohol use.  Patient does have a family history of alcoholism.  His  mother and father are both alcoholics, and he has a sibling that has  alcohol dependence.  He states that he was drinking either beer, wine,  or liquor on a daily basis.  He works as a Merchandiser, retail for [employer], and he states he used alcohol as a sleep aid.   Since discharge, he has abstained from alcohol, also discontinued  tobacco use.  He has been exercising on a regular basis and has lost  approximately 20 pounds.  He states that he is feeling much better.   PAST MEDICAL HISTORY:  SUMMARY:  1. Nonischemic cardiomyopathy, EF 20%-25%.  2. Hypertension.  3. History of alcohol abuse.  4. Previous tobacco use.  5. Abnormal glucose.   CURRENT MEDICATIONS:  1. Lisinopril 5 mg once a day.  2. Aspirin 81 mg once a day.  3. Furosemide 40 mg once a day.  4. Carvedilol 6.25 mg b.i.d.  5. Klor-con 10 mEq once a day.   ALLERGIES TO MEDICATIONS:  None known.   SOCIAL HISTORY:  Patient is married, lives with his wife.  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  He is originally from [place], [place], moved to [place] 2-1/2  years ago.  Patient has 2 sons, grown.   FAMILY HISTORY:  As noted above.  Mother and father both with  alcoholism.  Mother deceased with history of heart disease.  No family  history of type 2 diabetes.  Patient with sibling known to have alcohol  issues.   HABITS:  As noted above.  He quit tobacco recently.  Smoked on a regular  basis for 13 years, from 49 to the 1980s, and returned to smoking  approximately 6 months prior to recent hospitalization.   REVIEW OF SYSTEMS:  No fevers, chills, no HEENT symptoms.  Denies any  current chest discomfort or shortness of breath.  He is exercising on a  regular basis.  No heartburn, nausea, vomiting, constipation, diarrhea.  He has not  had a full colonoscopy in the past.  He does complain about  occasional erectile dysfunction with inability to maintain erection.  This was mentioned at the end of our interview.   PHYSICAL EXAMINATION:  VITALS:  Weight is 207 pounds, temperature 97.0,  pulse is 68, BP is 136/86 in the left arm in a seated position.  GENERAL:  The patient is a well-developed, well-nourished 65 year old  African-American male who appears his stated age.  HEENT:  Normocephalic, atraumatic.  Pupils are equal and reactive to  light bilaterally.  Extraocular motility was intact.  Patient was  anicteric, conjunctivae were within normal limits.  External auditory  canals and tympanic membranes were clear bilaterally.  Hearing was  grossly normal.  Oropharyngeal exam revealed multiple dental caries,  overall poor oral hygiene.  NECK:  Supple, no adenopathy, carotid bruit or thyromegaly.  CHEST EXAM:  Normal respiratory effort.  Chest was clear to auscultation  bilaterally.  No rhonchi, rales, or wheezing.  CARDIOVASCULAR:   Regular rate and rhythm, no significant murmurs, rubs,  or gallops appreciated.  ABDOMEN:  Slightly protuberant but nontender.  Positive bowel sounds.  Unable to appreciate organomegaly.  MUSCULOSKELETAL EXAM:  No cyanosis, clubbing, or edema.  SKIN:  Warm and dry.  Patient had intact pedis dorsalis pulses equal and symmetric.  NEUROLOGIC:  Cranial nerves II-XII were grossly intact, he was nonfocal.   IMPRESSION/RECOMMENDATIONS:  1. Nonischemic cardiomyopathy, clinically improving.  2. Hypertension, controlled.  3. History of alcohol abuse, currently in remission.  4. Health maintenance.   RECOMMENDATIONS:  I encouraged the patient to continue to abstain from  alcohol and tobacco.  He seems to be earnest in his recent lifestyle  changes.   With his abnormal blood sugar, I will add a hemoglobin A1c to his  upcoming labs, also we discussed screening for prostate cancer, and a  PSA will be added.   In terms of erectile dysfunction, I will add a testosterone level, and  we discussed possibly using a phosphodiesterase inhibitor once his  cardiomyopathy has improved.   He will follow up with me in approximately 2 months.  We discussed a  need for full screening colonoscopy which may be more appropriate once  his cardiomyopathy has improved.     Barbette Hair. Artist Pais, DO  Electronically Signed    RDY/MedQ  DD: 06/16/2006  DT: 06/16/2006  Job #: [id number]

## 2010-08-28 NOTE — Discharge Summary (Signed)
[name], [name]                 ACCOUNT NO.:  [id number]   MEDICAL RECORD NO.:  [id number]          PATIENT TYPE:  INP   LOCATION:  2001                         FACILITY:  [facility]   PHYSICIAN:  Rollene Rotunda, MD, FACCDATE OF BIRTH:  [birth year]   DATE OF ADMISSION:  04/23/2006  DATE OF DISCHARGE:  04/28/2006                               DISCHARGE SUMMARY   PROCEDURES:  1. Left heart cardiac catheterization.  2. Right heart cardiac catheterization.  3. Coronary arteriogram.  4. Left ventriculogram.  5. Endotracheal intubation with mechanical ventilation.  6. A 2-D echocardiogram.   PRIMARY DIAGNOSIS:  Acute respiratory failure, pulmonary edema.   SECONDARY DIAGNOSES:  1. Nonischemic cardiomyopathy with an ejection fraction of 25% at the      catheterization and 20-25% by echocardiogram with diffuse      hypokinesis.  2. Acute systolic congestive heart failure.  3. Hypertension.  4. History of noncompliance.  5. Polysubstance abuse with alcohol, tobacco and THC.  6. Dyslipidemia with a total cholesterol of 178, triglycerides 107,      HDL 57, LDL 100.  7. Hyperglycemia with an initial blood sugar of 286, fasting blood      sugar less than 100.   TIME AT DISCHARGE:  Forty-three minutes.   HOSPITAL COURSE:  Mr. Whittley is a 65 year old male with no previous  cardiac history.  He had an episode of nausea and vomiting on the day  prior to admission and on the day of admission he had some shortness of  breath.  EMS was called and he was reportedly treated with BiPAP and  Lasix.  There was concern that he had chest pain prior to his acute  shortness of breath.  He was intubated in the emergency room and  admitted for further evaluation and treatment.   A pulmonary consult was called and they managed his ventilator.  He was  felt to have acute hypoxic respiratory failure with diffuse pulmonary  infiltrates secondary to decompensated CHF and pulmonary edema.  Cardiac  enzymes  were cycled and he had some elevation in his CKs with a peak of  544, but the MBs were negative.  Troponin was minimally elevated at  0.30.  An echocardiogram was performed which showed a low EF.  It was  felt that he needed cardiac catheterization which was planned as soon as  he stabilized.   Mr. Manson was diuresed and was extubated by April 25, 2006.  He was  continued on p.o. Lasix and is discharged on 40 mg a day.  On April 26, 2006, he was felt stable for left and right heart catheterization.   The heart catheterization showed a 25% LAD.  The RCA was anomalous and  is the dominant vessel.  It was normal.  His EF was 25% with global  hypokinesis.  It was felt that he has nonischemic cardiomyopathy and was  educated as far as his factor reduction and his medications were  adjusted including adding low-dose ACE inhibitor and beta blocker.   A nutrition consult was called and a low sodium, heart healthy  diet was  discussed.   Blood cultures were done and were positive for gram-positive cocci.  This was coag negative Staphylococcus and was sensitive to Levaquin, so  he is to continue the antibiotics for a total of 10 days, per pulmonary  recommendations.  Additionally, there was concern about sleep apnea and  Dr. Jenene Slicker office is to fill out the paperwork for sleep study which  can be performed as an outpatient.  If the sleep study is positive he  will be referred back to pulmonary as an outpatient as well.   A cardiac CT was performed to further evaluate the right coronary artery  and make sure that there was no other intervention needed.  The cardiac  CT showed nonischemic cardiomyopathy.  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  His EF  by CT was 35%.  Dr. Eden Emms evaluated the films and felt that this was  not causing any of his symptoms.  He felt that this was although  anomalous not harmful to the patient and no  further testing or  evaluation needed to be done.   By April 28, 2006, Mr. Helming was felt to be at a base weight of 208  pounds.  He lost approximately 6 kg during his hospital stay.  He was  ambulating without chest pain or shortness of breath and his O2  saturation was 96% on room air.  He was evaluated by Dr. Antoine Poche and  considered stable for discharge with outpatient followup arranged.   DISCHARGE INSTRUCTIONS:  1. His activity level is to be increased gradually.  2. He is to weigh himself daily and report 5-pound gains.  3. He is to call our office for any problems with the cath site.  4. He is to stick to a 4,000 mg sodium diet that is low in fat.  5. He is to follow up with Dr. Antoine Poche on May 10, 2006, at 10      a.m.  He is to get the mass status at that office visit to follow      up on possible hypokalemia secondary to diuretics.  6. He is to obtain a primary care physician.  7. He is to be contacted by the sleep lab for sleep study.   DISCHARGE MEDICATIONS:  1. Coated aspirin 81 mg daily  2. Protonix 40 mg a day.  3. Furosemide 40 mg a day.  4. Lisinopril 5 mg a day.  5. Coreg 3.125 mg b.i.d.  6. Levaquin 750 mg every day for seven more days.      Theodore Demark, PA-C      Rollene Rotunda, MD, [facility]  Electronically Signed    RB/MEDQ  D:  04/28/2006  T:  04/28/2006  Job:  [id number]   cc:   Jayme Cloud, Dr.

## 2010-08-28 NOTE — Assessment & Plan Note (Signed)
[facility]                          CHRONIC HEART FAILURE NOTE   [name], [name]                          MRN:          [id number]  DATE:05/24/2006                            DOB:          September 17, 1945    [name] is a new patient to the heart failure clinic.  His primary  cardiologist is Dr. Antoine Poche.  He has no established primary care  physician at this time.  [name] is a very pleasant 65 year old  African-American gentleman recently seen at the [facility]  where he presented with acute pulmonary edema and was diagnosed with  cardiomyopathy.  His EF was found to be 20-25% at that time.  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  It was felt his cardiomyopathy was most likely related to EtOH use.  Mr.  [name] responded well to medications, was discharged home in stable  condition and followed up with Dr. Antoine Poche on January 29.  [name]  returns today for his first heart failure clinic visit.  He is  accompanied by his wife, [name].  [name] states he has been doing  quite well.  He has been very receptive to the educational pack I gave  him when he saw Dr. Antoine Poche in January.  He continues to weigh on a  daily basis.  He states his weight at home has been stable at 205  pounds.  He is tolerating medications without problems.  He is actually  back to walking 2-3 miles a day without shortness of breath.  He has  been very compliant in EtOH cessation and sodium restriction.  Denies  any symptoms suggestive of volume overload or chest discomfort.   PAST MEDICAL HISTORY:  1. Recent hospitalization for acute pulmonary edema diagnosed with      nonischemic cardiomyopathy at that time with EF of 20-25%.  2. Hypertension.  3. Previous EtOH use.  4. Previous tobacco use.  5. History of marijuana use.  6. Hyperglycemia with initial  blood sugar of 286 with a fasting blood      sugar less than 100 during recent hospitalization.  7. 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  It runs between the pulmonary artery and the aorta.   REVIEW OF SYSTEMS:  As stated above.   CURRENT MEDICATIONS:  1. Lisinopril 5 mg daily.  2. Aspirin 81.  3. Furosemide 40 mg daily.  4. Coreg 6.25 mg b.i.d.  5. Klor-Con 10 mEq daily.   PHYSICAL EXAMINATION:  VITAL SIGNS:  Weight 206 pounds, blood pressure  132/81 with a pulse of 81.  GENERAL:  [name] is in no acute distress.  No jugular vein  distention at 45-degree angle.  LUNGS:  Clear to auscultation bilaterally.  CARDIOVASCULAR:  Reveals an S1 and S2, regular rate and rhythm.  ABDOMEN:  Soft, nontender, positive bowel sounds, no hepatosplenomegaly.  LOWER EXTREMITIES:  Without clubbing, cyanosis, or edema.   IMPRESSION:  The patient is in no volume overload at this time.  Stable  nonischemic cardiomyopathy, class I.  Will have him go ahead and  decrease his Lasix to 20 mg daily and I am going to increase his Coreg  to 9.375 mg b.i.d.  I have also given him a new prescription for this.  I have reinforced heart failure education.  The patient has my phone  number.  He can call me directly if he has any problems, specifically  weight increase of 3 pounds within 2 days.  If not, I will see him back  in 3-4 weeks.   PRIMARY CARDIOLOGIST:  Rollene Rotunda, M.D.   PULMONARY:  Coralyn Helling, M.D.      Dorian Pod, ACNP  Electronically Signed      Bevelyn Buckles. Bensimhon, MD  Electronically Signed   MB/MedQ  DD: 05/24/2006  DT: 05/24/2006  Job #: [id number]

## 2010-09-01 ENCOUNTER — Ambulatory Visit: Payer: Federal, State, Local not specified - PPO | Admitting: Internal Medicine

## 2010-09-01 ENCOUNTER — Ambulatory Visit (INDEPENDENT_AMBULATORY_CARE_PROVIDER_SITE_OTHER): Payer: Federal, State, Local not specified - PPO | Admitting: Family

## 2010-09-01 ENCOUNTER — Encounter: Payer: Self-pay | Admitting: Family

## 2010-09-01 VITALS — BP 116/82 | HR 84 | Temp 97.1°F | Resp 18 | Ht 69.0 in | Wt 187.0 lb

## 2010-09-01 DIAGNOSIS — Z8679 Personal history of other diseases of the circulatory system: Secondary | ICD-10-CM

## 2010-09-01 DIAGNOSIS — N529 Male erectile dysfunction, unspecified: Secondary | ICD-10-CM | POA: Insufficient documentation

## 2010-09-01 DIAGNOSIS — I10 Essential (primary) hypertension: Secondary | ICD-10-CM

## 2010-09-01 MED ORDER — TADALAFIL 10 MG PO TABS: 10.0000 mg | ORAL_TABLET | ORAL | Status: DC | PRN

## 2010-09-01 NOTE — Assessment & Plan Note (Signed)
Patient has made significant lifestyle modifications. His blood pressure is stable off of medications, he is no longer having any peripheral edema or needing furosemide. We'll discontinue these medications from his list. I commended him on his dietary changes, absent from alcohol, and weight loss. I have instructed him to followup with Dr. Artist Pais in 6 months.

## 2010-09-01 NOTE — Assessment & Plan Note (Addendum)
BP Readings from Last 3 Encounters:  09/01/10 116/82  08/05/10 126/80  06/22/10 110/80   BP looks great off of meds.  Continue with dietary modification only.

## 2010-09-01 NOTE — Assessment & Plan Note (Signed)
Pt is stable off of diuretics- this was likely a myopathy due to alcohol which has resolved with his lifestyle modifications.  Monitor off of all meds.

## 2010-09-01 NOTE — Assessment & Plan Note (Signed)
Wants a PRN med- not daily med.  Will rx cialis PRN.

## 2010-09-01 NOTE — Patient Instructions (Signed)
Follow up with Dr. Artist Pais in 6 months.

## 2010-09-01 NOTE — Progress Notes (Signed)
  Subjective:    Patient ID: [name], male    DOB: 1945-10-11, 65 y.o.   MRN: [id number]  HPI [name] is a 65 year old male who presents today for his 1 month follow up.  He continues his weight loss- he has lost an additional 7 pounds.  Continues a vegan diet.  His goal weight will be 165.    ED-  Reports that cialis helps him.  Once PRN dosing.    CHF he continues to hold all his blood pressure medicines. He reports that he has not had any shortness of breath, chest pain, or lower extremity edema.    Review of Systems  see history of present illness  Past Medical History  Diagnosis Date  . Hypertension   . Routine general medical examination at a health care facility   . History of ETOH abuse   . Non-ischemic cardiomyopathy     Presumed secondary to Etoh and HTN  (Prev EF 20-25% -- improved to 50%)  . Anomalous right coronary artery     History   Social History  . Marital Status: Married    Spouse Name: [name]    Number of Children: 3  . Years of Education: N/A   Occupational History  . Retired Radiographer, therapeutic    Social History Main Topics  . Smoking status: Former Smoker -- 20 years    Quit date: 04/12/2002  . Smokeless tobacco: Never Used  . Alcohol Use: No     Quit ETOH 18 months  . Drug Use: No     history of marijuana use  . Sexually Active: Not on file   Other Topics Concern  . Not on file   Social History Narrative   2 sons and daughter1 son in [name]. Living with wife 62yrs [name]).  Use to live near [place].    No past surgical history on file.  Family History  Problem Relation Age of Onset  . Alcohol abuse Mother   . Heart disease Mother   . Alcohol abuse Father   . Diabetes Neg Hx     No Known Allergies  Current Outpatient Prescriptions on File Prior to Visit  Medication Sig Dispense Refill  . aspirin 81 MG EC tablet Take 81 mg by mouth daily.        . Multiple Vitamins-Minerals (CENTRUM SILVER ULTRA MENS PO) Take 1 tablet by mouth  daily.        Marland Kitchen DISCONTD: furosemide (LASIX) 20 MG tablet Once daily as needed for swelling  30 tablet  0  . DISCONTD: potassium chloride (KLOR-CON) 10 MEQ CR tablet Take 10 mEq by mouth daily. Once daily only on the days that you take lasix.        BP 116/82  Pulse 84  Temp(Src) 97.1 F (36.2 C) (Oral)  Resp 18  Ht 5\' 9"  (1.753 m)  Wt 187 lb (84.823 kg)  BMI 27.62 kg/m2       Objective:   Physical Exam  general: Awake, alert, pleasant African American male in no acute distress Cardiovascular: S1, S2, regular rate and rhythm   Respiratory: Breath sounds clear to auscultation bilaterally, without wheezes rales or rhonchi. No increased work of breathing Extremities: No peripheral edema is noted    Assessment & Plan:

## 2011-02-23 ENCOUNTER — Ambulatory Visit: Payer: Federal, State, Local not specified - PPO | Admitting: Family

## 2011-02-23 ENCOUNTER — Encounter: Payer: Self-pay | Admitting: Family

## 2011-02-23 ENCOUNTER — Ambulatory Visit (INDEPENDENT_AMBULATORY_CARE_PROVIDER_SITE_OTHER): Payer: Federal, State, Local not specified - PPO | Admitting: Family

## 2011-02-23 VITALS — BP 118/86 | HR 66 | Temp 98.7°F | Resp 16 | Ht 69.5 in | Wt 188.1 lb

## 2011-02-23 DIAGNOSIS — Z23 Encounter for immunization: Secondary | ICD-10-CM

## 2011-02-23 DIAGNOSIS — Z8679 Personal history of other diseases of the circulatory system: Secondary | ICD-10-CM

## 2011-02-23 DIAGNOSIS — Z0000 Encounter for general adult medical examination without abnormal findings: Secondary | ICD-10-CM | POA: Insufficient documentation

## 2011-02-23 DIAGNOSIS — R7989 Other specified abnormal findings of blood chemistry: Secondary | ICD-10-CM

## 2011-02-23 DIAGNOSIS — I10 Essential (primary) hypertension: Secondary | ICD-10-CM

## 2011-02-23 DIAGNOSIS — N529 Male erectile dysfunction, unspecified: Secondary | ICD-10-CM

## 2011-02-23 DIAGNOSIS — I509 Heart failure, unspecified: Secondary | ICD-10-CM

## 2011-02-23 DIAGNOSIS — R7401 Elevation of levels of liver transaminase levels: Secondary | ICD-10-CM

## 2011-02-23 LAB — HEPATIC FUNCTION PANEL
ALT: 11 U/L (ref 0–53)
AST: 19 U/L (ref 0–37)
Albumin: 4.1 g/dL (ref 3.5–5.2)
Alkaline Phosphatase: 46 U/L (ref 39–117)
Bilirubin, Direct: 0.1 mg/dL (ref 0.0–0.3)
Indirect Bilirubin: 0.5 mg/dL (ref 0.0–0.9)
Total Bilirubin: 0.6 mg/dL (ref 0.3–1.2)
Total Protein: 7 g/dL (ref 6.0–8.3)

## 2011-02-23 MED ORDER — TADALAFIL 10 MG PO TABS: 10.0000 mg | ORAL_TABLET | ORAL | Status: DC | PRN

## 2011-02-23 NOTE — Patient Instructions (Signed)
Please complete your lab work prior to leaving.  Follow up in 6 months, sooner if problems or concerns. Have a nice Thanksgiving!

## 2011-02-23 NOTE — Progress Notes (Signed)
Subjective:    Patient ID: [name], male    DOB: 09-10-1945, 65 y.o.   MRN: [id number]  HPI  Mr.  Francis is a 65 yr old male who presents today for his CPX. Last colo 2009- recommended 10 yr follow up.  He would like a flu shot.  Had pneumovax in 2009. Tetanus is up to date. He reports that he is exercising.  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  He has A frames with composting- grows all winter long.  Diet is good, though he tells me that he is no longer keeping vegetarian.  He plans to return to the vegetarian diet soon however.   ED- her reports good success with cialis.   HTN- currently diet controlled only.  CHF- denies shortness of breath, or lower extremity edema.       Review of Systems  Constitutional: Negative for unexpected weight change.  HENT: Negative for hearing loss and congestion.   Eyes: Negative for visual disturbance.  Respiratory: Negative for choking.   Cardiovascular: Negative for chest pain and palpitations.  Gastrointestinal: Negative for nausea, vomiting and blood in stool.  Genitourinary: Negative for frequency and hematuria.  Musculoskeletal: Negative for myalgias and arthralgias.  Skin: Negative for rash.  Neurological: Negative for headaches.  Hematological: Negative for adenopathy. Does not bruise/bleed easily.  Psychiatric/Behavioral:       Denies depression/anxiety   Past Medical History  Diagnosis Date  . Hypertension   . Routine general medical examination at a health care facility   . History of ETOH abuse   . Non-ischemic cardiomyopathy     Presumed secondary to Etoh and HTN  (Prev EF 20-25% -- improved to 50%)  . Anomalous right coronary artery     History   Social History  . Marital Status: Married    Spouse Name: [name]    Number of Children: 3  . Years of Education: N/A   Occupational History  . Retired Radiographer, therapeutic    Social History Main Topics  . Smoking status: Former Smoker -- 20 years    Quit  date: 04/12/2002  . Smokeless tobacco: Never Used  . Alcohol Use: No     Quit ETOH 18 months  . Drug Use: No     history of marijuana use  . Sexually Active: Not on file   Other Topics Concern  . Not on file   Social History Narrative   2 sons and daughter1 son in [name]. Living with wife 33yrs [name]).  Use to live near [place].    No past surgical history on file.  Family History  Problem Relation Age of Onset  . Alcohol abuse Mother   . Heart disease Mother   . Alcohol abuse Father   . Diabetes Neg Hx     No Known Allergies  Current Outpatient Prescriptions on File Prior to Visit  Medication Sig Dispense Refill  . aspirin 81 MG EC tablet Take 81 mg by mouth daily.        . Multiple Vitamins-Minerals (CENTRUM SILVER ULTRA MENS PO) Take 1 tablet by mouth daily.          BP 118/86  Pulse 66  Temp(Src) 98.7 F (37.1 C) (Oral)  Resp 16  Ht 5' 9.5" (1.765 m)  Wt 188 lb 1.3 oz (85.313 kg)  BMI 27.38 kg/m2       Objective:   Physical Exam  Constitutional: He is oriented to person, place, and time. He appears well-developed  and well-nourished. No distress.  HENT:  Head: Normocephalic and atraumatic.  Eyes: Conjunctivae are normal. No scleral icterus.  Neck: Normal range of motion. Neck supple.  Cardiovascular: Normal rate and regular rhythm.   No murmur heard. Pulmonary/Chest: Effort normal and breath sounds normal. No respiratory distress. He has no wheezes. He has no rales. He exhibits no tenderness.  Abdominal: Soft. Bowel sounds are normal.  Genitourinary: Rectum normal and prostate normal. Guaiac negative stool.  Musculoskeletal: Normal range of motion. He exhibits no edema.  Neurological: He is alert and oriented to person, place, and time. He has normal reflexes.  Skin: Skin is warm.  Psychiatric: He has a normal mood and affect. His behavior is normal. Judgment and thought content normal.          Assessment & Plan:

## 2011-02-24 ENCOUNTER — Encounter: Payer: Self-pay | Admitting: Family

## 2011-02-24 DIAGNOSIS — R7989 Other specified abnormal findings of blood chemistry: Secondary | ICD-10-CM | POA: Insufficient documentation

## 2011-02-24 NOTE — Assessment & Plan Note (Signed)
Pt was counseled on diet, exercise and weight loss.  Immunizations reviewed. Flu shot today.  He brings with him today his blood work from 10/12 which was done by his employer.  This showed a mild elevation of AST (55).  Total cholesterol was 187 and LDL was 115.  PSA was normal at 0.95.  Glucose/renal function WNL.

## 2011-02-24 NOTE — Assessment & Plan Note (Signed)
Pt reports that cialis has been effective for him. Continue same.

## 2011-02-24 NOTE — Assessment & Plan Note (Signed)
BP Readings from Last 3 Encounters:  02/23/11 118/86  09/01/10 116/82  08/05/10 126/80  This is well controlled on diet alone at this point, continue to monitor.

## 2011-02-24 NOTE — Assessment & Plan Note (Signed)
Will repeat LFTs today. 

## 2011-02-24 NOTE — Assessment & Plan Note (Signed)
Pt remains euvolemic. Monitor.

## 2011-08-24 ENCOUNTER — Encounter: Payer: Self-pay | Admitting: Family

## 2011-08-24 ENCOUNTER — Ambulatory Visit (INDEPENDENT_AMBULATORY_CARE_PROVIDER_SITE_OTHER): Payer: Federal, State, Local not specified - PPO | Admitting: Family

## 2011-08-24 VITALS — BP 110/76 | HR 72 | Temp 97.7°F | Resp 16 | Ht 69.5 in | Wt 197.1 lb

## 2011-08-24 DIAGNOSIS — I10 Essential (primary) hypertension: Secondary | ICD-10-CM

## 2011-08-24 DIAGNOSIS — Z23 Encounter for immunization: Secondary | ICD-10-CM

## 2011-08-24 DIAGNOSIS — Z2911 Encounter for prophylactic immunotherapy for respiratory syncytial virus (RSV): Secondary | ICD-10-CM

## 2011-08-24 LAB — BASIC METABOLIC PANEL
CO2: 28 mEq/L (ref 19–32)
Chloride: 104 mEq/L (ref 96–112)
Creat: 1.11 mg/dL (ref 0.50–1.35)
Potassium: 4.4 mEq/L (ref 3.5–5.3)

## 2011-08-24 LAB — LIPID PANEL
HDL: 62 mg/dL (ref >39)
LDL Cholesterol: 125 mg/dL — ABNORMAL HIGH (ref 0–99)
Triglycerides: 87 mg/dL (ref <150)
VLDL: 17 mg/dL (ref 0–40)

## 2011-08-24 NOTE — Progress Notes (Signed)
Subjective:    Patient ID: [name], male    DOB: [birth year], 66 y.o.   MRN: [id number]  HPI   [name] is a 67 yr old male who presents today for follow up.  HTN- Reports that he recently took a trip out of town to visit his kids and did not maintain his healthy diet. Notedd that his blood pressure started rising.   Reports that last night bp was 143/93, he took lisinopril, coreg, furosemide.  BP then came down to 130/82.    He is requesting zostavax today.  Review of Systems    see HPI  Past Medical History  Diagnosis Date  . Hypertension   . Routine general medical examination at a health care facility   . History of ETOH abuse   . Non-ischemic cardiomyopathy     Presumed secondary to Etoh and HTN  (Prev EF 20-25% -- improved to 50%)  . Anomalous right coronary artery     History   Social History  . Marital Status: Married    Spouse Name: [name]    Number of Children: 3  . Years of Education: N/A   Occupational History  . Retired Radiographer, therapeutic    Social History Main Topics  . Smoking status: Former Smoker -- 20 years    Quit date: 04/12/2002  . Smokeless tobacco: Never Used  . Alcohol Use: No     Quit ETOH 18 months  . Drug Use: No     history of marijuana use  . Sexually Active: Not on file   Other Topics Concern  . Not on file   Social History Narrative   2 sons and daughter1 son in [name]. Living with wife 75yrs [name]).  Use to live near [place].    No past surgical history on file.  Family History  Problem Relation Age of Onset  . Alcohol abuse Mother   . Heart disease Mother   . Alcohol abuse Father   . Diabetes Neg Hx     No Known Allergies  Current Outpatient Prescriptions on File Prior to Visit  Medication Sig Dispense Refill  . aspirin 81 MG EC tablet Take 81 mg by mouth daily.        . Multiple Vitamins-Minerals (CENTRUM SILVER ULTRA MENS PO) Take 1 tablet by mouth daily.        . tadalafil (CIALIS) 10 MG tablet Take 1 tablet (10 mg  total) by mouth as needed for erectile dysfunction.  10 tablet  1  . carvedilol (COREG) 25 MG tablet TAKE ONE TABLET BY MOUTH TWICE DAILY  180 tablet  2  . furosemide (LASIX) 20 MG tablet TAKE ONE TABLET BY MOUTH EVERY DAY  90 tablet  2  . KLOR-CON 10 10 MEQ tablet TAKE ONE TABLET BY MOUTH EVERY DAY  90 each  2  . lisinopril (PRINIVIL,ZESTRIL) 5 MG tablet TAKE ONE TABLET BY MOUTH TWICE DAILY  180 tablet  2    BP 110/76  Pulse 72  Temp(Src) 97.7 F (36.5 C) (Oral)  Resp 16  Ht 5' 9.5" (1.765 m)  Wt 197 lb 1.9 oz (89.413 kg)  BMI 28.69 kg/m2  SpO2 98%    Objective:   Physical Exam  Constitutional: He appears well-developed and well-nourished.  Cardiovascular: Normal rate and regular rhythm.   No murmur heard. Pulmonary/Chest: Effort normal and breath sounds normal. No respiratory distress. He has no wheezes. He has no rales. He exhibits no tenderness.  Musculoskeletal: He exhibits no edema.  Psychiatric: He has a normal mood and affect. His behavior is normal. Judgment and thought content normal.          Assessment & Plan:

## 2011-08-24 NOTE — Patient Instructions (Addendum)
Please call if blood pressure is <110/80. Complete your blood work prior to leaving.  Follow up in 3 months.

## 2011-08-25 ENCOUNTER — Other Ambulatory Visit: Payer: Self-pay | Admitting: Internal Medicine

## 2011-08-26 ENCOUNTER — Encounter: Payer: Self-pay | Admitting: Family

## 2011-08-29 NOTE — Assessment & Plan Note (Addendum)
Will carefully continue blood pressure medications.  Pt to check his blood pressure daily at home and call if blood pressure is <110/80. Obtain bmet.

## 2011-11-23 ENCOUNTER — Ambulatory Visit (INDEPENDENT_AMBULATORY_CARE_PROVIDER_SITE_OTHER): Payer: Federal, State, Local not specified - PPO | Admitting: Family

## 2011-11-23 ENCOUNTER — Encounter: Payer: Self-pay | Admitting: Family

## 2011-11-23 VITALS — BP 110/80 | HR 65 | Temp 97.7°F | Resp 14 | Ht 69.5 in | Wt 202.0 lb

## 2011-11-23 DIAGNOSIS — I10 Essential (primary) hypertension: Secondary | ICD-10-CM

## 2011-11-23 DIAGNOSIS — N529 Male erectile dysfunction, unspecified: Secondary | ICD-10-CM

## 2011-11-23 LAB — BASIC METABOLIC PANEL
Calcium: 9.2 mg/dL (ref 8.4–10.5)
Potassium: 4.1 mEq/L (ref 3.5–5.3)
Sodium: 141 mEq/L (ref 135–145)

## 2011-11-23 MED ORDER — LISINOPRIL 5 MG PO TABS: 5.0000 mg | ORAL_TABLET | Freq: Two times a day (BID) | ORAL | Status: DC

## 2011-11-23 MED ORDER — FUROSEMIDE 20 MG PO TABS: 20.0000 mg | ORAL_TABLET | Freq: Every day | ORAL | Status: DC

## 2011-11-23 MED ORDER — POTASSIUM CHLORIDE ER 10 MEQ PO TBCR: 10.0000 meq | EXTENDED_RELEASE_TABLET | Freq: Every day | ORAL | Status: DC

## 2011-11-23 MED ORDER — CARVEDILOL 25 MG PO TABS: 25.0000 mg | ORAL_TABLET | Freq: Two times a day (BID) | ORAL | Status: DC

## 2011-11-23 NOTE — Progress Notes (Signed)
Subjective:    Patient ID: [name], male    DOB: 1945-06-17, 66 y.o.   MRN: [id number]  HPI  Mr.  Vecchio is a 66 yr old male who presents today for follow up of his hypertension.  He reports that his BP has been well controlled.  Does not that if he does not get enough sleep his BP runs higher (130's systolic).  He denies chest pain, shortness of breath or edema.  He continues a healthy diet with lots of organic vegetables that he grows on his own.   Review of Systems See HP  Past Medical History  Diagnosis Date  . Hypertension   . Routine general medical examination at a health care facility   . History of ETOH abuse   . Non-ischemic cardiomyopathy     Presumed secondary to Etoh and HTN  (Prev EF 20-25% -- improved to 50%)  . Anomalous right coronary artery     History   Social History  . Marital Status: Married    Spouse Name: [name]    Number of Children: 3  . Years of Education: N/A   Occupational History  . Retired Radiographer, therapeutic    Social History Main Topics  . Smoking status: Former Smoker -- 20 years    Quit date: 04/12/2002  . Smokeless tobacco: Never Used  . Alcohol Use: No     Quit ETOH 18 months  . Drug Use: No     history of marijuana use  . Sexually Active: Not on file   Other Topics Concern  . Not on file   Social History Narrative   2 sons and daughter1 son in [name]. Living with wife 70yrs [name]).  Use to live near [place].    No past surgical history on file.  Family History  Problem Relation Age of Onset  . Alcohol abuse Mother   . Heart disease Mother   . Alcohol abuse Father   . Diabetes Neg Hx     No Known Allergies  Current Outpatient Prescriptions on File Prior to Visit  Medication Sig Dispense Refill  . aspirin 81 MG EC tablet Take 81 mg by mouth daily.        . Multiple Vitamins-Minerals (CENTRUM SILVER ULTRA MENS PO) Take 1 tablet by mouth daily.        Marland Kitchen DISCONTD: carvedilol (COREG) 25 MG tablet TAKE ONE TABLET BY MOUTH TWICE  DAILY  180 tablet  2  . DISCONTD: furosemide (LASIX) 20 MG tablet TAKE ONE TABLET BY MOUTH EVERY DAY  90 tablet  2  . DISCONTD: KLOR-CON 10 10 MEQ tablet TAKE ONE TABLET BY MOUTH EVERY DAY  90 each  2  . DISCONTD: lisinopril (PRINIVIL,ZESTRIL) 5 MG tablet TAKE ONE TABLET BY MOUTH TWICE DAILY  180 tablet  2    BP 110/80  Pulse 65  Temp 97.7 F (36.5 C) (Oral)  Resp 14  Ht 5' 9.5" (1.765 m)  Wt 202 lb (91.627 kg)  BMI 29.40 kg/m2  SpO2 96%       Objective:   Physical Exam  Constitutional: He appears well-developed and well-nourished. No distress.  Cardiovascular: Normal rate and regular rhythm.   No murmur heard. Pulmonary/Chest: Effort normal and breath sounds normal. No respiratory distress. He has no wheezes. He has no rales. He exhibits no tenderness.  Skin: Skin is warm and dry.  Psychiatric: He has a normal mood and affect. His behavior is normal. Judgment and thought content normal.  Assessment & Plan:

## 2011-11-23 NOTE — Patient Instructions (Addendum)
Please complete your blood work prior to leaving today.  Follow up in October for a medicare wellness visit.

## 2011-11-23 NOTE — Assessment & Plan Note (Signed)
BP stable. Continue current meds.  Obtain BMET. 

## 2012-01-10 ENCOUNTER — Ambulatory Visit: Payer: Federal, State, Local not specified - PPO

## 2012-01-13 ENCOUNTER — Ambulatory Visit (INDEPENDENT_AMBULATORY_CARE_PROVIDER_SITE_OTHER): Payer: Federal, State, Local not specified - PPO

## 2012-01-13 DIAGNOSIS — Z23 Encounter for immunization: Secondary | ICD-10-CM | POA: Diagnosis not present

## 2012-01-18 ENCOUNTER — Ambulatory Visit: Payer: Federal, State, Local not specified - PPO | Admitting: Family

## 2012-03-21 ENCOUNTER — Telehealth: Payer: Self-pay | Admitting: *Deleted

## 2012-03-21 NOTE — Telephone Encounter (Signed)
Received message from pt stating he received a voucher for a free trial of cialis and would like to try it. Pt requested a return call. Attempted to reach pt and left message on home # to return my call.

## 2012-03-22 MED ORDER — TADALAFIL 20 MG PO TABS: 20.0000 mg | ORAL_TABLET | Freq: Every day | ORAL | Status: DC | PRN

## 2012-03-22 NOTE — Telephone Encounter (Signed)
Pt reports the voucher is for #3 free tablets of Cialis 20mg . Pt requests that we send rx to [facility] on [address] if approved. Please advise.

## 2012-03-22 NOTE — Telephone Encounter (Signed)
rx has been sent 

## 2012-03-23 NOTE — Telephone Encounter (Signed)
Left detailed message on pt's cell# and to call if any questions. 

## 2012-08-08 ENCOUNTER — Telehealth: Payer: Self-pay | Admitting: *Deleted

## 2012-08-08 MED ORDER — SILDENAFIL CITRATE 100 MG PO TABS: 50.0000 mg | ORAL_TABLET | Freq: Every day | ORAL | Status: DC | PRN

## 2012-08-08 NOTE — Telephone Encounter (Signed)
Received message from pt stating cialis is not helping. He wants to try Viagra and is requesting Rx. Pt has follow up on 10/23/12. Please advise.

## 2012-08-08 NOTE — Telephone Encounter (Signed)
Rx sent 

## 2012-08-08 NOTE — Telephone Encounter (Signed)
Left message with pt's wife for pt to return my call.  

## 2012-08-09 NOTE — Telephone Encounter (Signed)
Notified pt. 

## 2012-10-05 ENCOUNTER — Other Ambulatory Visit: Payer: Self-pay | Admitting: Family

## 2012-10-23 ENCOUNTER — Ambulatory Visit (INDEPENDENT_AMBULATORY_CARE_PROVIDER_SITE_OTHER): Payer: Medicare Other | Admitting: Family

## 2012-10-23 ENCOUNTER — Encounter: Payer: Self-pay | Admitting: Family

## 2012-10-23 VITALS — BP 120/82 | HR 73 | Temp 98.3°F | Resp 16 | Ht 70.0 in | Wt 204.0 lb

## 2012-10-23 DIAGNOSIS — Z8679 Personal history of other diseases of the circulatory system: Secondary | ICD-10-CM

## 2012-10-23 DIAGNOSIS — Z125 Encounter for screening for malignant neoplasm of prostate: Secondary | ICD-10-CM | POA: Diagnosis not present

## 2012-10-23 DIAGNOSIS — Z13820 Encounter for screening for osteoporosis: Secondary | ICD-10-CM

## 2012-10-23 DIAGNOSIS — N529 Male erectile dysfunction, unspecified: Secondary | ICD-10-CM | POA: Diagnosis not present

## 2012-10-23 DIAGNOSIS — E785 Hyperlipidemia, unspecified: Secondary | ICD-10-CM | POA: Diagnosis not present

## 2012-10-23 DIAGNOSIS — R7989 Other specified abnormal findings of blood chemistry: Secondary | ICD-10-CM

## 2012-10-23 DIAGNOSIS — Z8781 Personal history of (healed) traumatic fracture: Secondary | ICD-10-CM

## 2012-10-23 DIAGNOSIS — Z0000 Encounter for general adult medical examination without abnormal findings: Secondary | ICD-10-CM | POA: Diagnosis not present

## 2012-10-23 DIAGNOSIS — I10 Essential (primary) hypertension: Secondary | ICD-10-CM

## 2012-10-23 NOTE — Progress Notes (Signed)
Subjective:    Patient ID: [name], male    DOB: [birth year], 67 y.o.   MRN: [id number]  HPI Pt here for non-fasting physical. Up to date with tetanus and shingles vaccine. Last pneumovax 2009. Last colonoscopy and EKG 2009.   Subjective:   Patient here for Medicare annual wellness visit and management of other chronic and acute problems.  HTN- Pt continues coreg, lisinopril.  CHF- continues lasix.  Denies sob.  ED- Reports that since he lost weight his ED is improved.    Immunizations:will be due for pneumovax booster this fall Diet: overall healthy diet Exercise: in silver sneaker program- jogging 1-5 miles a day, works at [employer] 20 hrs a week.   Colonoscopy: 2009- up to date Dexa: never  Risk factors: heart disease  Roster of Physicians Providing Medical Care to Patient: No specialists  Activities of Daily Living   In your present state of health, do you have any difficulty performing the following activities? Preparing food and eating?: No  Bathing yourself: No  Getting dressed: No  Using the toilet:No  Moving around from place to place: No  In the past year have you fallen or had a near fall?:No    Home Safety: Has smoke detector and wears seat belts. No firearms. No excess sun exposure.  Diet and Exercise  Current exercise habits: yes  Dietary issues discussed: healthy diet   Depression Screen  (Note: if answer to either of the following is "Yes", then a more complete depression screening is indicated)  Q1: Over the past two weeks, have you felt down, depressed or hopeless?no  Q2: Over the past two weeks, have you felt little interest or pleasure in doing things? no   The following portions of the patient's history were reviewed and updated as appropriate: allergies, current medications, past family history, past medical history, past social history, past surgical history and problem list.   Objective:   Vision: Not performed Hearing:  Able to  hear forced whisper at 6 feet Body mass index: see nursing Cognitive Impairment Assessment: cognition, memory and judgment appear normal.    Assessment:   Medicare wellness preventive parameters- obtain PSA, dexa, EKG, abdominal US for AAA screening, fasting lab work  Plan:    During the course of the visit the patient was educated and counseled about appropriate screening and preventive services including:       Nutrition counseling   Vaccines / LABSPatient Instructions (the written plan) was given to the patient.       Review of Systems  Constitutional: Negative for unexpected weight change.  HENT: Negative for congestion.   Respiratory: Negative for cough and shortness of breath.   Cardiovascular: Negative for chest pain.  Gastrointestinal: Negative for vomiting, diarrhea and constipation.  Genitourinary: Negative for dysuria and frequency.  Musculoskeletal: Negative for myalgias and arthralgias.  Psychiatric/Behavioral:       Denies depression   Past Medical History  Diagnosis Date  . Hypertension   . Routine general medical examination at a health care facility   . History of ETOH abuse   . Non-ischemic cardiomyopathy     Presumed secondary to Etoh and HTN  (Prev EF 20-25% -- improved to 50%)  . Anomalous right coronary artery     History   Social History  . Marital Status: Married    Spouse Name: [name]    Number of Children: 3  . Years of Education: N/A   Occupational History  . Retired Radiographer, therapeutic  Social History Main Topics  . Smoking status: Former Smoker -- 20 years    Quit date: 04/12/2002  . Smokeless tobacco: Never Used  . Alcohol Use: No     Comment: Quit ETOH 18 months  . Drug Use: No     Comment: history of marijuana use  . Sexually Active: Not on file   Other Topics Concern  . Not on file   Social History Narrative   2 sons and daughter   1 son in [name]. Living with wife 46yrs [name]).  Use to live near [place].    History reviewed.  No pertinent past surgical history.  Family History  Problem Relation Age of Onset  . Alcohol abuse Mother   . Heart disease Mother   . Alcohol abuse Father   . Diabetes Neg Hx     No Known Allergies  Current Outpatient Prescriptions on File Prior to Visit  Medication Sig Dispense Refill  . aspirin 81 MG EC tablet Take 81 mg by mouth daily.        . carvedilol (COREG) 25 MG tablet TAKE ONE TABLET BY MOUTH TWICE DAILY WITH MEALS  180 tablet  0  . furosemide (LASIX) 20 MG tablet TAKE ONE TABLET BY MOUTH EVERY DAY  90 tablet  0  . lisinopril (PRINIVIL,ZESTRIL) 5 MG tablet Take 1 tablet (5 mg total) by mouth 2 (two) times daily.  180 tablet  2  . Multiple Vitamins-Minerals (CENTRUM SILVER ULTRA MENS PO) Take 1 tablet by mouth daily.        . potassium chloride (K-DUR) 10 MEQ tablet TAKE ONE TABLET BY MOUTH EVERY DAY  90 tablet  0  . sildenafil (VIAGRA) 100 MG tablet Take 0.5-1 tablets (50-100 mg total) by mouth daily as needed for erectile dysfunction.  5 tablet  2   No current facility-administered medications on file prior to visit.    BP 120/82  Pulse 73  Temp(Src) 98.3 F (36.8 C) (Oral)  Resp 16  Ht 5\' 10"  (1.778 m)  Wt 204 lb (92.534 kg)  BMI 29.27 kg/m2  SpO2 97%       Objective:   Physical Exam  Physical Exam  Constitutional: He is oriented to person, place, and time. He appears well-developed and well-nourished. No distress.  HENT:  Head: Normocephalic and atraumatic.  Right Ear: Tympanic membrane and ear canal normal.  Left Ear: Tympanic membrane and ear canal normal.  Mouth/Throat: Oropharynx is clear and moist.  Eyes: Pupils are equal, round, and reactive to light. No scleral icterus.  Neck: Normal range of motion. No thyromegaly present.  Cardiovascular: Normal rate and regular rhythm.   No murmur heard. Pulmonary/Chest: Effort normal and breath sounds normal. No respiratory distress. He has no wheezes. He has no rales. He exhibits no tenderness.   Abdominal: Soft. Bowel sounds are normal. He exhibits no distension and no mass. There is no tenderness. There is no rebound and no guarding.  Musculoskeletal: He exhibits no edema.  Lymphadenopathy:    He has no cervical adenopathy.  Neurological: He is alert and oriented to person, place, and time. He has normal reflexes. He exhibits normal muscle tone. Coordination normal.  Skin: Skin is warm and dry.  Psychiatric: He has a normal mood and affect. His behavior is normal. Judgment and thought content normal.          Assessment & Plan:         Assessment & Plan:

## 2012-10-23 NOTE — Assessment & Plan Note (Signed)
Obtain flp/lft.  

## 2012-10-23 NOTE — Assessment & Plan Note (Signed)
BP stable on current meds. Continue same.  

## 2012-10-23 NOTE — Assessment & Plan Note (Signed)
Clinically euvolemic.  EKG today is reviewed and compared to EKG on file from 2009 and appears unchanged.

## 2012-10-23 NOTE — Assessment & Plan Note (Signed)
Improved. Monitor.  

## 2012-10-23 NOTE — Patient Instructions (Addendum)
Please schedule bone density at the front desk. Return fasting at your convenience for blood work. You will be contacted about your abdominal ultrasound. Follow up in 6 months.

## 2012-10-25 ENCOUNTER — Ambulatory Visit (INDEPENDENT_AMBULATORY_CARE_PROVIDER_SITE_OTHER)
Admission: RE | Admit: 2012-10-25 | Discharge: 2012-10-25 | Disposition: A | Discharge status: Home / Self Care | Payer: Medicare Other | Source: Ambulatory Visit | Attending: Family | Admitting: Family

## 2012-10-25 ENCOUNTER — Telehealth: Payer: Self-pay | Admitting: *Deleted

## 2012-10-25 DIAGNOSIS — Z125 Encounter for screening for malignant neoplasm of prostate: Secondary | ICD-10-CM | POA: Diagnosis not present

## 2012-10-25 DIAGNOSIS — I10 Essential (primary) hypertension: Secondary | ICD-10-CM | POA: Diagnosis not present

## 2012-10-25 DIAGNOSIS — E785 Hyperlipidemia, unspecified: Secondary | ICD-10-CM | POA: Diagnosis not present

## 2012-10-25 DIAGNOSIS — Z13820 Encounter for screening for osteoporosis: Secondary | ICD-10-CM | POA: Diagnosis not present

## 2012-10-25 LAB — HEPATIC FUNCTION PANEL
AST: 14 U/L (ref 0–37)
Alkaline Phosphatase: 43 U/L (ref 39–117)
Bilirubin, Direct: 0.1 mg/dL (ref 0.0–0.3)
Indirect Bilirubin: 0.5 mg/dL (ref 0.0–0.9)
Total Bilirubin: 0.6 mg/dL (ref 0.3–1.2)

## 2012-10-25 LAB — LIPID PANEL
HDL: 57 mg/dL (ref >39)
LDL Cholesterol: 115 mg/dL — ABNORMAL HIGH (ref 0–99)
Total CHOL/HDL Ratio: 3.3 Ratio
VLDL: 16 mg/dL (ref 0–40)

## 2012-10-25 NOTE — Telephone Encounter (Signed)
Received message on 10/24/12 from [place] at [facility] requesting clarification of order for u/s aorta medicare screening. Per verbal from Provider, she is screening for AAA. Left detailed message on Felicia's voicemail and to call if further concerns / needs.

## 2012-10-26 ENCOUNTER — Encounter: Payer: Self-pay | Admitting: Family

## 2012-10-26 LAB — BASIC METABOLIC PANEL WITH GFR
Calcium: 9.3 mg/dL (ref 8.4–10.5)
GFR, Est African American: 88 mL/min
GFR, Est Non African American: 76 mL/min
Glucose, Bld: 95 mg/dL (ref 70–99)
Sodium: 140 mEq/L (ref 135–145)

## 2012-10-26 NOTE — Telephone Encounter (Signed)
Spoke with [name] in Cardiology, they are able to perform the Medicare screening for AAA and scheduled appt for 11/03/12 at 9:00. Pt should arrive by 8:45am and have nothing to eat or drink after midnight the night before the exam. Pt aware.

## 2012-10-26 NOTE — Telephone Encounter (Signed)
Correction to documentation below, pt is not aware of exam prep. Left message for pt to return my call.

## 2012-10-30 NOTE — Telephone Encounter (Signed)
Left detailed message on pt's cell# re: prep instructions for 11/03/12 ultrasound and to call if any questions.

## 2012-11-03 ENCOUNTER — Encounter (INDEPENDENT_AMBULATORY_CARE_PROVIDER_SITE_OTHER): Payer: Medicare Other

## 2012-11-03 DIAGNOSIS — Z136 Encounter for screening for cardiovascular disorders: Secondary | ICD-10-CM

## 2012-11-03 DIAGNOSIS — I10 Essential (primary) hypertension: Secondary | ICD-10-CM

## 2012-11-06 ENCOUNTER — Encounter: Payer: Self-pay | Admitting: Family

## 2012-11-08 ENCOUNTER — Other Ambulatory Visit: Payer: Self-pay | Admitting: Family

## 2012-11-15 ENCOUNTER — Other Ambulatory Visit: Payer: Self-pay

## 2012-12-26 ENCOUNTER — Ambulatory Visit: Payer: Self-pay | Admitting: Internal Medicine

## 2012-12-26 VITALS — BP 124/78 | HR 66 | Temp 98.1°F | Resp 18 | Ht 69.0 in | Wt 213.0 lb

## 2012-12-26 DIAGNOSIS — Z0289 Encounter for other administrative examinations: Secondary | ICD-10-CM

## 2012-12-26 NOTE — Progress Notes (Signed)
  Subjective:    Patient ID: [name], male    DOB: 1945/12/07, 67 y.o.   MRN: [id number]  HPI Needs DOT physical Feels great and has lost 45lbs  Review of Systems HTN controlled    Objective:   Physical Exam  Vitals reviewed. Constitutional: He is oriented to person, place, and time. He appears well-developed and well-nourished. No distress.  HENT:  Head: Normocephalic.  Right Ear: External ear normal.  Left Ear: External ear normal.  Nose: Nose normal.  Mouth/Throat: Oropharynx is clear and moist.  Eyes: Conjunctivae and EOM are normal. Pupils are equal, round, and reactive to light.  Neck: Normal range of motion. Neck supple. No thyromegaly present.  Cardiovascular: Normal rate, regular rhythm, normal heart sounds and intact distal pulses.   Pulmonary/Chest: Effort normal and breath sounds normal.  Abdominal: Soft. Bowel sounds are normal. He exhibits no mass. There is no tenderness.  Genitourinary: Penis normal.  Musculoskeletal: Normal range of motion.  Neurological: He is alert and oriented to person, place, and time. He has normal reflexes. No cranial nerve deficit. He exhibits normal muscle tone. Coordination normal.  Skin: No rash noted.  Psychiatric: He has a normal mood and affect. His behavior is normal. Judgment and thought content normal.          Assessment & Plan:  DOT 1 yr

## 2012-12-26 NOTE — Progress Notes (Signed)
UA-sp gr-1.010, protein-neg, blood-neg, glucose-neg 

## 2012-12-27 ENCOUNTER — Other Ambulatory Visit: Payer: Self-pay | Admitting: Family

## 2012-12-28 ENCOUNTER — Encounter: Payer: Self-pay | Admitting: Internal Medicine

## 2013-01-03 ENCOUNTER — Ambulatory Visit (INDEPENDENT_AMBULATORY_CARE_PROVIDER_SITE_OTHER): Payer: Medicare Other

## 2013-01-03 DIAGNOSIS — Z23 Encounter for immunization: Secondary | ICD-10-CM | POA: Diagnosis not present

## 2013-04-30 ENCOUNTER — Ambulatory Visit (INDEPENDENT_AMBULATORY_CARE_PROVIDER_SITE_OTHER): Payer: Medicare Other | Admitting: Family

## 2013-04-30 ENCOUNTER — Encounter: Payer: Self-pay | Admitting: Family

## 2013-04-30 VITALS — BP 130/80 | HR 64 | Temp 98.7°F | Resp 16 | Ht 70.0 in | Wt 220.1 lb

## 2013-04-30 DIAGNOSIS — I10 Essential (primary) hypertension: Secondary | ICD-10-CM | POA: Diagnosis not present

## 2013-04-30 DIAGNOSIS — R0681 Apnea, not elsewhere classified: Secondary | ICD-10-CM | POA: Diagnosis not present

## 2013-04-30 DIAGNOSIS — Z0189 Encounter for other specified special examinations: Secondary | ICD-10-CM | POA: Diagnosis not present

## 2013-04-30 DIAGNOSIS — N529 Male erectile dysfunction, unspecified: Secondary | ICD-10-CM | POA: Diagnosis not present

## 2013-04-30 LAB — BASIC METABOLIC PANEL
BUN: 14 mg/dL (ref 6–23)
CHLORIDE: 103 meq/L (ref 96–112)
CO2: 28 meq/L (ref 19–32)
Calcium: 9.2 mg/dL (ref 8.4–10.5)
Creat: 1.13 mg/dL (ref 0.50–1.35)
Glucose, Bld: 100 mg/dL — ABNORMAL HIGH (ref 70–99)
Potassium: 4.6 mEq/L (ref 3.5–5.3)
SODIUM: 144 meq/L (ref 135–145)

## 2013-04-30 MED ORDER — FUROSEMIDE 20 MG PO TABS: ORAL_TABLET | ORAL | Status: DC

## 2013-04-30 MED ORDER — POTASSIUM CHLORIDE ER 10 MEQ PO TBCR: EXTENDED_RELEASE_TABLET | ORAL | Status: DC

## 2013-04-30 MED ORDER — CARVEDILOL 25 MG PO TABS: ORAL_TABLET | ORAL | Status: DC

## 2013-04-30 NOTE — Patient Instructions (Signed)
You will be contacted re: your referral for home sleep study. Complete your lab work prior to leaving. Work hard on [employer], exercise, weight loss. Follow up in 4 months.

## 2013-04-30 NOTE — Progress Notes (Signed)
Subjective:    Patient ID: [name], male    DOB: [birth year], 68 y.o.   MRN: [id number]  HPI  [name] is a 68 yr old male who presents today for follow up of multiple medical problems.  1) HTN- Currently maintained on coreg 25mg , lasix and lisinopril.  Denies chest pain, SOB or swelling.    2) Hyperlipidemia- Not currently on statin.  Lab Results  Component Value Date   CHOL 188 10/23/2012   HDL 57 10/23/2012   LDLCALC 115* 10/23/2012   TRIG 79 10/23/2012   CHOLHDL 3.3 10/23/2012   3) ED- maintained on viagra. Using PRN. Doesn't always need to use viagra.  4) Apnea- pt's wife notes + snoring at night.  He has gained 16 pounds since the summer.  Wt Readings from Last 3 Encounters:  04/30/13 220 lb 1.3 oz (99.828 kg)  12/26/12 213 lb (96.616 kg)  10/23/12 204 lb (92.534 kg)      Review of Systems See HPI  Past Medical History  Diagnosis Date  . Hypertension   . Routine general medical examination at a health care facility   . History of ETOH abuse   . Non-ischemic cardiomyopathy     Presumed secondary to Etoh and HTN  (Prev EF 20-25% -- improved to 50%)  . Anomalous right coronary artery     History   Social History  . Marital Status: Married    Spouse Name: [name]    Number of Children: 3  . Years of Education: N/A   Occupational History  . Retired Radiographer, therapeutic    Social History Main Topics  . Smoking status: Former Smoker -- 20 years    Quit date: 04/12/2002  . Smokeless tobacco: Never Used  . Alcohol Use: No     Comment: Quit ETOH 18 months  . Drug Use: No     Comment: history of marijuana use  . Sexual Activity: Not on file   Other Topics Concern  . Not on file   Social History Narrative   2 sons and daughter   1 son in [name]. Living with wife 29yrs [name]).  Use to live near [place].    No past surgical history on file.  Family History  Problem Relation Age of Onset  . Alcohol abuse Mother   . Heart disease Mother   . Alcohol abuse  Father   . Diabetes Neg Hx   . Heart disease Maternal Grandmother     No Known Allergies  Current Outpatient Prescriptions on File Prior to Visit  Medication Sig Dispense Refill  . lisinopril (PRINIVIL,ZESTRIL) 5 MG tablet TAKE ONE TABLET BY MOUTH TWICE DAILY  180 tablet  1  . Multiple Vitamins-Minerals (CENTRUM SILVER ULTRA MENS PO) Take 1 tablet by mouth daily.        . sildenafil (VIAGRA) 100 MG tablet Take 0.5-1 tablets (50-100 mg total) by mouth daily as needed for erectile dysfunction.  5 tablet  2   No current facility-administered medications on file prior to visit.    BP 130/80  Pulse 64  Temp(Src) 98.7 F (37.1 C) (Oral)  Resp 16  Ht 5\' 10"  (1.778 m)  Wt 220 lb 1.3 oz (99.828 kg)  BMI 31.58 kg/m2  SpO2 96%       Objective:   Physical Exam  Constitutional: He is oriented to person, place, and time. He appears well-developed and well-nourished. No distress.  Cardiovascular: Normal rate and regular rhythm.   No murmur heard. Pulmonary/Chest:  Effort normal and breath sounds normal. No respiratory distress. He has no wheezes. He has no rales. He exhibits no tenderness.  Neurological: He is alert and oriented to person, place, and time.  Psychiatric: He has a normal mood and affect. His behavior is normal. Judgment and thought content normal.          Assessment & Plan:

## 2013-04-30 NOTE — Progress Notes (Signed)
Pre visit review using our clinic review tool, if applicable. No additional management support is needed unless otherwise documented below in the visit note. 

## 2013-05-01 ENCOUNTER — Encounter: Payer: Self-pay | Admitting: Family

## 2013-05-01 DIAGNOSIS — Z0189 Encounter for other specified special examinations: Secondary | ICD-10-CM | POA: Insufficient documentation

## 2013-05-01 NOTE — Assessment & Plan Note (Signed)
BP Readings from Last 3 Encounters:  04/30/13 130/80  12/26/12 124/78  10/23/12 120/82   Stable on current meds.  Continue same. Obtain bmet.

## 2013-05-01 NOTE — Assessment & Plan Note (Signed)
Witnessed apneas.  + weight gain, refer for home sleep study.

## 2013-05-01 NOTE — Assessment & Plan Note (Signed)
Stable with rare use of viagra.

## 2013-05-16 ENCOUNTER — Telehealth: Payer: Self-pay | Admitting: Family

## 2013-05-16 NOTE — Telephone Encounter (Signed)
Relevant patient education mailed to patient.  

## 2013-06-04 ENCOUNTER — Other Ambulatory Visit: Payer: Self-pay | Admitting: Family

## 2013-08-27 ENCOUNTER — Ambulatory Visit (INDEPENDENT_AMBULATORY_CARE_PROVIDER_SITE_OTHER): Payer: Medicare Other | Admitting: Family

## 2013-08-27 ENCOUNTER — Encounter: Payer: Self-pay | Admitting: Family

## 2013-08-27 VITALS — BP 110/80 | HR 68 | Temp 98.0°F | Resp 16 | Ht 70.0 in | Wt 215.1 lb

## 2013-08-27 DIAGNOSIS — G4730 Sleep apnea, unspecified: Secondary | ICD-10-CM | POA: Diagnosis not present

## 2013-08-27 DIAGNOSIS — E785 Hyperlipidemia, unspecified: Secondary | ICD-10-CM | POA: Diagnosis not present

## 2013-08-27 DIAGNOSIS — I10 Essential (primary) hypertension: Secondary | ICD-10-CM

## 2013-08-27 DIAGNOSIS — N529 Male erectile dysfunction, unspecified: Secondary | ICD-10-CM

## 2013-08-27 DIAGNOSIS — G479 Sleep disorder, unspecified: Secondary | ICD-10-CM

## 2013-08-27 HISTORY — DX: Hyperlipidemia, unspecified: E78.5

## 2013-08-27 LAB — BASIC METABOLIC PANEL
BUN: 14 mg/dL (ref 6–23)
CHLORIDE: 103 meq/L (ref 96–112)
CO2: 28 mEq/L (ref 19–32)
Calcium: 9.6 mg/dL (ref 8.4–10.5)
Creat: 1.48 mg/dL — ABNORMAL HIGH (ref 0.50–1.35)
Glucose, Bld: 80 mg/dL (ref 70–99)
POTASSIUM: 4.2 meq/L (ref 3.5–5.3)
Sodium: 141 mEq/L (ref 135–145)

## 2013-08-27 NOTE — Assessment & Plan Note (Signed)
Wife has witnessed apneas. Will see if home health can provide him with home sleep study to further evaluate.

## 2013-08-27 NOTE — Progress Notes (Signed)
Subjective:    Patient ID: [name], male    DOB: [birth year], 68 y.o.   MRN: [id number]  HPI  Mr. Altice is a 68 yr old male who presents today for follow up of multiple medical problems:  1) HTN-  On coreg, lasix, lisinopril.  Denies CP, SOB or swelling.   2) Hyperlipidemia-  Not currently on statin.  Has increased his walking and has been working on his weight loss.   Lab Results  Component Value Date   CHOL 188 10/23/2012   HDL 57 10/23/2012   LDLCALC 115* 10/23/2012   TRIG 79 10/23/2012   CHOLHDL 3.3 10/23/2012    3) ED- no improvement with viagra or cialis.    4) ? OSA- last visit home sleep study was ordered. Reports that he went to [facility] and they did not have the machine available that was needed.    Review of Systems    see HPI  Past Medical History  Diagnosis Date  . Hypertension   . Routine general medical examination at a health care facility   . History of ETOH abuse   . Non-ischemic cardiomyopathy     Presumed secondary to Etoh and HTN  (Prev EF 20-25% -- improved to 50%)  . Anomalous right coronary artery     History   Social History  . Marital Status: Married    Spouse Name: [name]    Number of Children: 3  . Years of Education: N/A   Occupational History  . Retired Radiographer, therapeutic    Social History Main Topics  . Smoking status: Former Smoker -- 20 years    Quit date: 04/12/2002  . Smokeless tobacco: Never Used  . Alcohol Use: No     Comment: Quit ETOH 18 months  . Drug Use: No     Comment: history of marijuana use  . Sexual Activity: Not on file   Other Topics Concern  . Not on file   Social History Narrative   2 sons and daughter   1 son in [name]. Living with wife 27yrs [name]).  Use to live near [place].    No past surgical history on file.  Family History  Problem Relation Age of Onset  . Alcohol abuse Mother   . Heart disease Mother   . Alcohol abuse Father   . Diabetes Neg Hx   . Heart disease Maternal Grandmother      No Known Allergies  Current Outpatient Prescriptions on File Prior to Visit  Medication Sig Dispense Refill  . carvedilol (COREG) 25 MG tablet TAKE ONE TABLET BY MOUTH TWICE DAILY  180 tablet  1  . furosemide (LASIX) 20 MG tablet TAKE ONE TABLET BY MOUTH ONCE DAILY  90 tablet  1  . lisinopril (PRINIVIL,ZESTRIL) 5 MG tablet TAKE ONE TABLET BY MOUTH TWICE DAILY  180 tablet  1  . Multiple Vitamins-Minerals (CENTRUM SILVER ULTRA MENS PO) Take 1 tablet by mouth daily.        . potassium chloride (K-DUR) 10 MEQ tablet TAKE ONE TABLET BY MOUTH EVERY DAY  90 tablet  1  . sildenafil (VIAGRA) 100 MG tablet Take 0.5-1 tablets (50-100 mg total) by mouth daily as needed for erectile dysfunction.  5 tablet  2   No current facility-administered medications on file prior to visit.    BP 110/80  Pulse 68  Temp(Src) 98 F (36.7 C) (Oral)  Resp 16  Ht 5\' 10"  (1.778 m)  Wt 215 lb 1.3 oz (  97.56 kg)  BMI 30.86 kg/m2  SpO2 98%    Objective:   Physical Exam  Constitutional: He is oriented to person, place, and time. He appears well-developed and well-nourished. No distress.  HENT:  Head: Normocephalic and atraumatic.  Cardiovascular: Normal rate and regular rhythm.   No murmur heard. Pulmonary/Chest: Effort normal and breath sounds normal. No respiratory distress. He has no wheezes. He has no rales. He exhibits no tenderness.  Musculoskeletal: He exhibits no edema.  Neurological: He is alert and oriented to person, place, and time.  Psychiatric: He has a normal mood and affect. His behavior is normal. Judgment and thought content normal.          Assessment & Plan:

## 2013-08-27 NOTE — Assessment & Plan Note (Signed)
Lipids stable.  Continue low fat/low cholesterol diet.

## 2013-08-27 NOTE — Assessment & Plan Note (Signed)
BP stable on current meds. Continue same, obtain bmet.  

## 2013-08-27 NOTE — Assessment & Plan Note (Signed)
Uncontrolled. Refer to urology.

## 2013-08-27 NOTE — Progress Notes (Signed)
Pre visit review using our clinic review tool, if applicable. No additional management support is needed unless otherwise documented below in the visit note. 

## 2013-08-27 NOTE — Patient Instructions (Addendum)
You will be contacted about your referral for home sleep study and urology. Complete lab work prior to leaving.  Follow up in 6 months for medicare wellness visit.

## 2013-08-28 ENCOUNTER — Telehealth: Payer: Self-pay | Admitting: *Deleted

## 2013-08-28 DIAGNOSIS — N289 Disorder of kidney and ureter, unspecified: Secondary | ICD-10-CM | POA: Diagnosis not present

## 2013-08-28 NOTE — Telephone Encounter (Signed)
Notified pt. He denies NSAID usage. Will return to lab around 09/11/13 for repeat BMP; lab order entered.

## 2013-08-28 NOTE — Telephone Encounter (Signed)
Message copied by Kathi Simpers on Tue Aug 28, 2013  4:12 PM ------      Message from: O'SULLIVAN, MELISSA      Created: Tue Aug 28, 2013  7:13 AM       Kidney function has worsened slightly since last visit.  Is he using nsaids?  Should stop if he is.  Repeat bmet in 2 weeks- dx renal insufficiency. ------

## 2013-09-11 DIAGNOSIS — N400 Enlarged prostate without lower urinary tract symptoms: Secondary | ICD-10-CM | POA: Diagnosis not present

## 2013-09-11 DIAGNOSIS — N529 Male erectile dysfunction, unspecified: Secondary | ICD-10-CM | POA: Diagnosis not present

## 2013-09-12 LAB — BASIC METABOLIC PANEL
BUN: 16 mg/dL (ref 6–23)
CHLORIDE: 104 meq/L (ref 96–112)
CO2: 29 mEq/L (ref 19–32)
Calcium: 9.3 mg/dL (ref 8.4–10.5)
Creat: 1.2 mg/dL (ref 0.50–1.35)
GLUCOSE: 166 mg/dL — AB (ref 70–99)
Potassium: 4.2 mEq/L (ref 3.5–5.3)
Sodium: 139 mEq/L (ref 135–145)

## 2013-09-13 ENCOUNTER — Telehealth: Payer: Self-pay | Admitting: *Deleted

## 2013-09-13 DIAGNOSIS — R739 Hyperglycemia, unspecified: Secondary | ICD-10-CM

## 2013-09-13 NOTE — Telephone Encounter (Signed)
Message copied by Regis Bill on Thu Sep 13, 2013 12:00 PM ------      Message from: O'SULLIVAN, MELISSA      Created: Wed Sep 12, 2013  4:57 PM       Yes please. ------

## 2013-09-13 NOTE — Telephone Encounter (Signed)
Notes Recorded by Kathi Simpers, CMA on 09/12/2013 at 12:20 PM Lab will correct collection date to reflect that specimen was drawn on 09/11/13. They will not be able to add hgb a1c. Should I call pt to come back for blood draw? Notes Recorded by Sandford Craze, NP on 09/12/2013 at 8:51 AM Kidney function improved. Sugar elevated, please ask pt to complete a1c (dx hyperglycemia)  Notes Recorded by Regis Bill, CMA on 09/13/2013 at 12:00 PM [facility] with contact name and number [for return call, if needed] RE: results and further provider instructions [return for A1C], order placed/SLS

## 2013-09-20 DIAGNOSIS — G4730 Sleep apnea, unspecified: Secondary | ICD-10-CM | POA: Diagnosis not present

## 2013-09-25 DIAGNOSIS — R7309 Other abnormal glucose: Secondary | ICD-10-CM | POA: Diagnosis not present

## 2013-09-25 LAB — HEMOGLOBIN A1C
HEMOGLOBIN A1C: 5.7 % — AB (ref <5.7)
Mean Plasma Glucose: 117 mg/dL — ABNORMAL HIGH (ref <117)

## 2013-10-03 DIAGNOSIS — G4730 Sleep apnea, unspecified: Secondary | ICD-10-CM | POA: Diagnosis not present

## 2013-10-17 ENCOUNTER — Telehealth: Payer: Self-pay | Admitting: Family

## 2013-10-17 ENCOUNTER — Encounter: Payer: Self-pay | Admitting: Family

## 2013-10-17 DIAGNOSIS — G4733 Obstructive sleep apnea (adult) (pediatric): Secondary | ICD-10-CM

## 2013-10-17 HISTORY — DX: Obstructive sleep apnea (adult) (pediatric): G47.33

## 2013-10-17 NOTE — Telephone Encounter (Signed)
Please advise pt that his sleep study shows severe sleep apnea.  I would recommend CPAP titration study.  Pended below.

## 2013-10-18 NOTE — Telephone Encounter (Signed)
LMOM with contact name and number for return call RE: results and further provider instructions/SLS  

## 2013-10-23 NOTE — Telephone Encounter (Signed)
Notified pt and he voices understanding. Order signed. 

## 2013-10-26 ENCOUNTER — Encounter: Payer: Self-pay | Admitting: Family

## 2013-10-29 ENCOUNTER — Ambulatory Visit: Payer: Medicare Other | Admitting: Family

## 2013-10-30 DIAGNOSIS — N529 Male erectile dysfunction, unspecified: Secondary | ICD-10-CM | POA: Diagnosis not present

## 2013-10-30 DIAGNOSIS — N400 Enlarged prostate without lower urinary tract symptoms: Secondary | ICD-10-CM | POA: Diagnosis not present

## 2013-11-01 ENCOUNTER — Other Ambulatory Visit: Payer: Self-pay | Admitting: Family

## 2013-11-01 NOTE — Telephone Encounter (Signed)
Rx request [3 of 4] to pharmacy/SLS Lisinopril request Denied--Too Soon:  Medication Detail      Disp Refills Start End     lisinopril (PRINIVIL,ZESTRIL) 5 MG tablet 180 tablet 1 06/04/2013     Sig: TAKE ONE TABLET BY MOUTH TWICE DAILY    E-Prescribing Status: Receipt confirmed by pharmacy (06/04/2013 2:33 PM EST)

## 2013-11-03 ENCOUNTER — Other Ambulatory Visit: Payer: Self-pay | Admitting: Family

## 2013-12-19 ENCOUNTER — Ambulatory Visit (HOSPITAL_BASED_OUTPATIENT_CLINIC_OR_DEPARTMENT_OTHER): Payer: Medicare Other | Attending: Family | Admitting: Radiology

## 2013-12-19 VITALS — Ht 70.0 in | Wt 210.0 lb

## 2013-12-19 DIAGNOSIS — G4733 Obstructive sleep apnea (adult) (pediatric): Secondary | ICD-10-CM | POA: Diagnosis not present

## 2013-12-19 DIAGNOSIS — Z9989 Dependence on other enabling machines and devices: Secondary | ICD-10-CM

## 2013-12-31 ENCOUNTER — Telehealth: Payer: Self-pay | Admitting: Family

## 2013-12-31 DIAGNOSIS — G4733 Obstructive sleep apnea (adult) (pediatric): Secondary | ICD-10-CM

## 2013-12-31 DIAGNOSIS — G4730 Sleep apnea, unspecified: Secondary | ICD-10-CM

## 2013-12-31 DIAGNOSIS — G4710 Hypersomnia, unspecified: Secondary | ICD-10-CM | POA: Diagnosis not present

## 2013-12-31 NOTE — Sleep Study (Signed)
[facility]  NAME: [name]  DATE OF BIRTH: 04-28-1945  MEDICAL RECORD NUMBER [id number]  LOCATION: [facility]  PHYSICIAN: ALVA,RAKESH V.  DATE OF STUDY: 12/19/13   SLEEP STUDY TYPE: CPAP titration study               REFERRING PHYSICIAN: Oretha Milch, MD  INDICATION FOR STUDY: 68 year old presented with witnessed apneas and loud snoring and daytime fatigue. Home study showed severe OSA with AHI 39 events per hour.  At the time of this study ,they weighed 210 pounds with a height of  5 ft 10 inches and the BMI of 30, neck size of 14.5 inches. Epworth sleepiness score was 5.  This CPAP titration polysomnogram was performed with a sleep technologist in attendance. EEG, EOG,EMG and respiratory parameters recorded. Sleep stages, arousals, limb movements and respiratory data was scored according to criteria laid out by the American Academy of sleep medicine.  SLEEP ARCHITECTURE: Lights out was at 2213 PM and lights on was at 504 AM. Total sleep time was 190 minutes with sleep period time of 380 minutes and sleep efficiency of 46% .Sleep latency was 25 minutes with latency to REM sleep of 36 minutes and wake after sleep onset of 21 minutes.  Sleep stages as a percentage of total sleep time was N1 -32 %,N2- 56 % and REM sleep 12 % ( 23 minutes) . The longest period of REM sleep was around 2330 PM.   AROUSAL DATA : There were brought out to arousals with an arousal index of 30 to events per hour. Of these 100 were spontaneous, and to were associated with respiratory events and 0 were associated periodic limb movements  RESPIRATORY DATA: CPAP was initiated at 5 centimeters and titrated to a final level of 13 centimeters due to respiratory events and snoring. At the final level of 13 centimeters, there were 0 obstructive apneas, 0 central apneas, 0 mixed apneas and 0 hypopneas with apnea -hypopnea index of 0 events per hour.  There was no relation to sleep  stage or body position. Titration was optimal.  MOVEMENT/PARASOMNIA: There were 8 PLMS with a PLM index of 2.5 events per hour. The PLM arousal index was 0 events per hour.  OXYGEN DATA: The lowest desaturation was 92 % during non-REM sleep and the desaturation index was 3 per hour. The saturations stayed below 88% for 0 minutes.  CARDIAC DATA: The low heart rate was 32 beats per minute. The high heart rate recorded was an artifact. No arrhythmias were noted   IMPRESSION :  1. severe obstructive sleep apnea with hypopneas causing sleep fragmentation and mild oxygen desaturation. 2. This was corrected by CPAP of 13 centimeters with a medium fullface mask. Titration was optimal. 3. No evidence of cardiac arrhythmias or behavioral disturbance during sleep. 4. Periodic limb movements were not noted.  RECOMMENDATION:    1. The treatment options for this degree of sleep disordered breathing includes weight loss and/or CPAP therapy. CPAP can be initiated at 13 centimeters with a medium fullface mask and compliance monitored at this level. 2. Patient should be cautioned against driving when sleepy 3. They should be asked to avoid medications with sedative side effects  Oretha Milch  MD Diplomate, American Board of Sleep Medicine  ELECTRONICALLY SIGNED ON: 12/31/2013  [facility] PH: [phone]   FX: [phone] ACCREDITED BY THE AMERICAN ACADEMY OF SLEEP MEDICINE

## 2013-12-31 NOTE — Telephone Encounter (Signed)
See my chart message

## 2014-01-01 NOTE — Telephone Encounter (Signed)
[facility] MESSAGE REPORT Message [phone]    From Sandford Craze, NP [[phone]]   To RYANLEE BABAR   Composed 12/31/2013 2:28 PM   For Delivery On 12/31/2013 2:28 PM   Subject Sleep study   Message Type User Message   Read Status N   By [name] has not read the message   Message Body Mr. Kreger,   I received the optimized settings today from your recent sleep study and have placed an order for home health to bring you a CPAP machine at home. Please let me know if you have not heard back about this in 1 week.   Thanks,   [employer]

## 2014-01-02 NOTE — Telephone Encounter (Signed)
Patient informed, understood & agreed/SLS  

## 2014-01-02 NOTE — Telephone Encounter (Signed)
Please contact pt re: unread message. 

## 2014-01-04 DIAGNOSIS — N529 Male erectile dysfunction, unspecified: Secondary | ICD-10-CM | POA: Diagnosis not present

## 2014-01-07 ENCOUNTER — Other Ambulatory Visit: Payer: Self-pay | Admitting: Family

## 2014-01-09 NOTE — Telephone Encounter (Signed)
Melissa,  Do we use referral from 08/2013 for home health cpap? I do not see a more recent referral placed.

## 2014-01-09 NOTE — Telephone Encounter (Signed)
[name], could you please check status of this referral and let pt know?

## 2014-01-21 ENCOUNTER — Ambulatory Visit (INDEPENDENT_AMBULATORY_CARE_PROVIDER_SITE_OTHER): Payer: Medicare Other

## 2014-01-21 DIAGNOSIS — Z23 Encounter for immunization: Secondary | ICD-10-CM | POA: Diagnosis not present

## 2014-02-25 ENCOUNTER — Ambulatory Visit (INDEPENDENT_AMBULATORY_CARE_PROVIDER_SITE_OTHER): Payer: Medicare Other | Admitting: Family

## 2014-02-25 ENCOUNTER — Encounter: Payer: Self-pay | Admitting: Family

## 2014-02-25 VITALS — BP 130/84 | HR 63 | Temp 98.1°F | Resp 16 | Ht 70.0 in | Wt 209.6 lb

## 2014-02-25 DIAGNOSIS — G4733 Obstructive sleep apnea (adult) (pediatric): Secondary | ICD-10-CM | POA: Diagnosis not present

## 2014-02-25 DIAGNOSIS — Z0000 Encounter for general adult medical examination without abnormal findings: Secondary | ICD-10-CM | POA: Diagnosis not present

## 2014-02-25 DIAGNOSIS — E785 Hyperlipidemia, unspecified: Secondary | ICD-10-CM | POA: Diagnosis not present

## 2014-02-25 DIAGNOSIS — Z8679 Personal history of other diseases of the circulatory system: Secondary | ICD-10-CM

## 2014-02-25 DIAGNOSIS — I10 Essential (primary) hypertension: Secondary | ICD-10-CM

## 2014-02-25 LAB — BASIC METABOLIC PANEL
BUN: 15 mg/dL (ref 6–23)
CALCIUM: 9.3 mg/dL (ref 8.4–10.5)
CO2: 26 mEq/L (ref 19–32)
Chloride: 107 mEq/L (ref 96–112)
Creatinine, Ser: 1.1 mg/dL (ref 0.4–1.5)
GFR: 82.37 mL/min (ref >60.00)
Glucose, Bld: 89 mg/dL (ref 70–99)
Potassium: 4.9 mEq/L (ref 3.5–5.1)
SODIUM: 144 meq/L (ref 135–145)

## 2014-02-25 LAB — LIPID PANEL
Cholesterol: 186 mg/dL (ref 0–200)
HDL: 42.6 mg/dL (ref >39.00)
LDL Cholesterol: 128 mg/dL — ABNORMAL HIGH (ref 0–99)
NONHDL: 143.4
Total CHOL/HDL Ratio: 4
Triglycerides: 79 mg/dL (ref 0.0–149.0)
VLDL: 15.8 mg/dL (ref 0.0–40.0)

## 2014-02-25 NOTE — Patient Instructions (Signed)
You will be contacted about your referral for echocardiogram and CPAP- let me know if you have not heard back about these in 1 week. Complete lab work prior to leaving. Please schedule a follow up appointment in 6 months.

## 2014-02-25 NOTE — Progress Notes (Deleted)
   Subjective:    Patient ID: [name], male    DOB: 1946/02/02, 68 y.o.   MRN: [id number]  HPI  [name] is a 68 yr old male who presents today   Review of Systems     Objective:   Physical Exam        Assessment & Plan:

## 2014-02-25 NOTE — Progress Notes (Signed)
Patient ID: [name], male   DOB: 03-Oct-1945, 68 y.o.   MRN: [id number] Subjective:    [name] is a 68 y.o. male who presents for Medicare Initial preventive examination.   Preventive Screening-Counseling & Management  Tobacco History  Smoking status  . Former Smoker -- 20 years  . Quit date: 04/12/2002  Smokeless tobacco  . Never Used    Problems Prior to Visit 1. HTN-  BP Readings from Last 3 Encounters:  02/25/14 130/84  08/27/13 110/80  04/30/13 130/80   2. OSA- had sleep study 12/19/13, reports he never was contacted re: cpap  3.  Hyperlipidemia-  Lab Results  Component Value Date   CHOL 188 10/23/2012   HDL 57 10/23/2012   LDLCALC 115* 10/23/2012   TRIG 79 10/23/2012   CHOLHDL 3.3 10/23/2012   4.  Hx of CHF- denies sob, swelling or chest pain.   Current Problems (verified) Patient Active Problem List   Diagnosis Date Noted  . OSA (obstructive sleep apnea) 10/17/2013  . Other and unspecified hyperlipidemia 08/27/2013  . Need for assessment for sleep apnea 05/01/2013  . Elevated liver function tests 02/24/2011  . General medical examination 02/23/2011  . Erectile dysfunction 09/01/2010  . ABNORMAL EKG 03/21/2008  . HYPERTENSION 08/02/2007  . CONGESTIVE HEART FAILURE, HX OF 08/02/2007    Medications Prior to Visit Current Outpatient Prescriptions on File Prior to Visit  Medication Sig Dispense Refill  . carvedilol (COREG) 25 MG tablet TAKE ONE TABLET BY MOUTH TWICE DAILY 180 tablet 1  . furosemide (LASIX) 20 MG tablet TAKE ONE TABLET BY MOUTH ONCE DAILY 90 tablet 1  . lisinopril (PRINIVIL,ZESTRIL) 5 MG tablet TAKE ONE TABLET BY MOUTH TWICE DAILY 180 tablet 1  . potassium chloride (K-DUR) 10 MEQ tablet TAKE ONE TABLET BY MOUTH ONCE DAILY 90 tablet 0   No current facility-administered medications on file prior to visit.    Current Medications (verified) Current Outpatient Prescriptions  Medication Sig Dispense Refill  . carvedilol (COREG) 25 MG  tablet TAKE ONE TABLET BY MOUTH TWICE DAILY 180 tablet 1  . furosemide (LASIX) 20 MG tablet TAKE ONE TABLET BY MOUTH ONCE DAILY 90 tablet 1  . lisinopril (PRINIVIL,ZESTRIL) 5 MG tablet TAKE ONE TABLET BY MOUTH TWICE DAILY 180 tablet 1  . potassium chloride (K-DUR) 10 MEQ tablet TAKE ONE TABLET BY MOUTH ONCE DAILY 90 tablet 0   No current facility-administered medications for this visit.     Allergies (verified) Review of patient's allergies indicates no known allergies.   PAST HISTORY  Family History Family History  Problem Relation Age of Onset  . Alcohol abuse Mother   . Heart disease Mother   . Alcohol abuse Father   . Diabetes Neg Hx   . Heart disease Maternal Grandmother     Social History History  Substance Use Topics  . Smoking status: Former Smoker -- 20 years    Quit date: 04/12/2002  . Smokeless tobacco: Never Used  . Alcohol Use: No     Comment: Quit ETOH 18 months    Are there smokers in your home (other than you)?   no  Risk Factors Current exercise habits: works in his garden, walking 5x a week, treadmill  Dietary issues discussed: no red meat, eats fish   Cardiac risk factors: advanced age (older than 1 for men, 52 for women), dyslipidemia, hypertension and male gender.  Depression Screen (Note: if answer to either of the following is "Yes", a more complete  depression screening is indicated)   Q1: Over the past two weeks, have you felt down, depressed or hopeless? No  Q2: Over the past two weeks, have you felt little interest or pleasure in doing things? No  Have you lost interest or pleasure in daily life? No  Do you often feel hopeless? No  Do you cry easily over simple problems? No  Activities of Daily Living In your present state of health, do you have any difficulty performing the following activities?:  Driving? No Managing money?  No Feeding yourself? No Getting from bed to chair? No Climbing a flight of stairs? No Preparing food and  eating?: No Bathing or showering? No Getting dressed: No Getting to the toilet? No Using the toilet:No Moving around from place to place: No In the past year have you fallen or had a near fall?:No   Are you sexually active?  Yes  Do you have more than one partner?  No  Hearing Difficulties: No Do you often ask people to speak up or repeat themselves? No Do you experience ringing or noises in your ears? No Do you have difficulty understanding soft or whispered voices? No   Do you feel that you have a problem with memory? No  Do you often misplace items? No  Do you feel safe at home?  Yes  Cognitive Testing  Alert? Yes  Normal Appearance?Yes  Oriented to person? Yes  Place? Yes   Time? Yes  Recall of three objects?  Yes  Can perform simple calculations? Yes  Displays appropriate judgment?Yes  Can read the correct time from a watch face?Yes   Advanced Directives have been discussed with the patient? Yes   List the Names of Other Physician/Practitioners you currently use: 1.  none  Indicate any recent Medical Services you may have received from other than [facility] providers in the past year (date may be approximate).  Immunization History  Administered Date(s) Administered  . Influenza Split 02/23/2011, 01/13/2012  . Influenza Whole 01/19/2008, 01/20/2009, 12/18/2009  . Influenza,inj,Quad PF,36+ Mos 01/03/2013, 01/21/2014  . Pneumococcal Conjugate-13 01/21/2014  . Pneumococcal Polysaccharide-23 01/19/2008  . Td 01/29/2008  . Zoster 08/24/2011    Screening Tests Health Maintenance  Topic Date Due  . INFLUENZA VACCINE  11/11/2014  . COLONOSCOPY  05/21/2017  . TETANUS/TDAP  01/28/2018  . PNEUMOCOCCAL POLYSACCHARIDE VACCINE AGE 28 AND OVER  Completed  . ZOSTAVAX  Completed    All answers were reviewed with the patient and necessary referrals were made:  O'SULLIVAN,Madhav Mohon S., NP   02/25/2014   History reviewed: allergies, current medications, past family history,  past medical history, past social history, past surgical history and problem list  Review of Systems Pertinent items are noted in HPI.    Objective:     Vision by Snellen chart: right eye:see nursing, left eye:see nursing Blood pressure 130/84, pulse 63, temperature 98.1 F (36.7 C), temperature source Oral, resp. rate 16, height 5\' 10"  (1.778 m), weight 209 lb 9.6 oz (95.074 kg), SpO2 99 %. Body mass index is 30.07 kg/(m^2).  Physical Exam  Constitutional: He is oriented to person, place, and time. He appears well-developed and well-nourished. No distress.  HENT:  Head: Normocephalic and atraumatic.  Right Ear: Tympanic membrane and ear canal normal.  Left Ear: Tympanic membrane and ear canal normal.  Mouth/Throat: Oropharynx is clear and moist.  Eyes: Pupils are equal, round, and reactive to light. No scleral icterus.  Neck: Normal range of motion. No thyromegaly present.  Cardiovascular:  Normal rate and regular rhythm.   No murmur heard. Pulmonary/Chest: Effort normal and breath sounds normal. No respiratory distress. He has no wheezes. He has no rales. He exhibits no tenderness.  Abdominal: Soft. Bowel sounds are normal. He exhibits no distension and no mass. There is no tenderness. There is no rebound and no guarding.  Musculoskeletal: He exhibits no edema.  Lymphadenopathy:    He has no cervical adenopathy.  Neurological: He is alert and oriented to person, place, and time. He has normal patellar reflexes. He exhibits normal muscle tone. Coordination normal.  Skin: Skin is warm and dry.  Psychiatric: He has a normal mood and affect. His behavior is normal. Judgment and thought content normal.          Assessment & Plan:         Assessment:           Plan:     During the course of the visit the patient was educated and counseled about appropriate screening and preventive services including:    Advanced directives: No advanced directive, given paperwork  today.  Diet review for nutrition referral? Yes ____  Not Indicated __x__   Patient Instructions (the written plan) was given to the patient.  Medicare Attestation I have personally reviewed: The patient's medical and social history Their use of alcohol, tobacco or illicit drugs Their current medications and supplements The patient's functional ability including ADLs,fall risks, home safety risks, cognitive, and hearing and visual impairment Diet and physical activities Evidence for depression or mood disorders  The patient's weight, height, BMI, and visual acuity have been recorded in the chart.  I have made referrals, counseling, and provided education to the patient based on review of the above and I have provided the patient with a written personalized care plan for preventive services.     O'SULLIVAN,Omnia Dollinger S., NP   02/25/2014

## 2014-02-25 NOTE — Assessment & Plan Note (Signed)
Clinically stable.  Obtain follow up 2D echo

## 2014-02-25 NOTE — Assessment & Plan Note (Signed)
BP Readings from Last 3 Encounters:  02/25/14 130/84  08/27/13 110/80  04/30/13 130/80   BP stable on current meds.  Obtain bmet.

## 2014-02-25 NOTE — Progress Notes (Signed)
Pre visit review using our clinic review tool, if applicable. No additional management support is needed unless otherwise documented below in the visit note. 

## 2014-02-25 NOTE — Assessment & Plan Note (Signed)
Needs home cpap, will re-initiate. Advised pt to call if he does not receive call in next 1 week.

## 2014-02-27 ENCOUNTER — Other Ambulatory Visit (HOSPITAL_COMMUNITY): Payer: Medicare Other

## 2014-02-27 ENCOUNTER — Ambulatory Visit (HOSPITAL_COMMUNITY): Payer: Medicare Other | Attending: Cardiovascular Disease | Admitting: Radiology

## 2014-02-27 ENCOUNTER — Other Ambulatory Visit (HOSPITAL_COMMUNITY): Payer: Self-pay | Admitting: Family

## 2014-02-27 DIAGNOSIS — I5031 Acute diastolic (congestive) heart failure: Secondary | ICD-10-CM

## 2014-02-27 DIAGNOSIS — I10 Essential (primary) hypertension: Secondary | ICD-10-CM | POA: Insufficient documentation

## 2014-02-27 DIAGNOSIS — Z8679 Personal history of other diseases of the circulatory system: Secondary | ICD-10-CM

## 2014-02-27 NOTE — Progress Notes (Signed)
Echocardiogram performed.  

## 2014-02-28 ENCOUNTER — Encounter: Payer: Self-pay | Admitting: Family

## 2014-02-28 DIAGNOSIS — I5042 Chronic combined systolic (congestive) and diastolic (congestive) heart failure: Secondary | ICD-10-CM

## 2014-03-14 ENCOUNTER — Encounter: Payer: Self-pay | Admitting: Family

## 2014-03-23 ENCOUNTER — Other Ambulatory Visit: Payer: Self-pay | Admitting: Family

## 2014-03-25 NOTE — Telephone Encounter (Signed)
Medication Detail      Disp Refills Start End     potassium chloride (K-DUR) 10 MEQ tablet 90 tablet 0 11/01/2013     Sig: TAKE ONE TABLET BY MOUTH ONCE DAILY    E-Prescribing Status: Receipt confirmed by pharmacy (11/01/2013 6:56 PM EDT)    Rx request to pharmacy/SLS

## 2014-03-26 ENCOUNTER — Ambulatory Visit: Payer: Medicare Other | Admitting: Cardiovascular Disease

## 2014-04-22 ENCOUNTER — Other Ambulatory Visit: Payer: Self-pay | Admitting: Family

## 2014-04-22 ENCOUNTER — Ambulatory Visit (INDEPENDENT_AMBULATORY_CARE_PROVIDER_SITE_OTHER): Payer: Medicare Other | Admitting: Internal Medicine

## 2014-04-22 ENCOUNTER — Encounter: Payer: Self-pay | Admitting: Internal Medicine

## 2014-04-22 VITALS — BP 121/90 | HR 67 | Ht 70.0 in | Wt 211.0 lb

## 2014-04-22 DIAGNOSIS — I10 Essential (primary) hypertension: Secondary | ICD-10-CM

## 2014-04-22 DIAGNOSIS — E785 Hyperlipidemia, unspecified: Secondary | ICD-10-CM | POA: Diagnosis not present

## 2014-04-22 MED ORDER — SPIRONOLACTONE 25 MG PO TABS: 12.5000 mg | ORAL_TABLET | Freq: Every day | ORAL | Status: DC

## 2014-04-22 MED ORDER — ATORVASTATIN CALCIUM 10 MG PO TABS: 10.0000 mg | ORAL_TABLET | Freq: Every day | ORAL | Status: DC

## 2014-04-22 NOTE — Telephone Encounter (Signed)
Last filled:  11/05/13 Amt:  180, 1  Last OV:  02/25/14  Med filled.

## 2014-04-22 NOTE — Patient Instructions (Addendum)
Your physician wants you to follow-up in: August You will receive a reminder letter in the mail two months in advance. If you don't receive a letter, please call our office to schedule the follow-up appointment.  Your physician recommends that you return for lab work in: 7-10 days.  BMP  Your physician recommends that you return for fasting lab work in: 2 months--Lipid, AST.  Your physician has recommended you make the following change in your medication:  Start Atorvastatin 10 mg by mouth daily. Start Aldactone 12.5 mg by mouth daily. Stop potassium.

## 2014-04-22 NOTE — Progress Notes (Signed)
HPI Patinet is a 69 yo who is referred for continued medical Rx of congestive heart failure The patinet was seen back in 2008 by J Hochrein  He has not been seen by cardiolgy since Work up in past consistent with NICM  CT scan did show some mild CAD  Last echo in November LVEF 30 to 35%    Patinet denies CP  No SOB  No dizziness  No edema.   No Known Allergies  Current Outpatient Prescriptions  Medication Sig Dispense Refill  . carvedilol (COREG) 25 MG tablet TAKE ONE TABLET BY MOUTH TWICE DAILY 180 tablet 1  . furosemide (LASIX) 20 MG tablet TAKE ONE TABLET BY MOUTH ONCE DAILY 90 tablet 1  . KLOR-CON M10 10 MEQ tablet TAKE ONE TABLET BY MOUTH ONCE DAILY 90 tablet 0  . lisinopril (PRINIVIL,ZESTRIL) 5 MG tablet TAKE ONE TABLET BY MOUTH TWICE DAILY 180 tablet 1   No current facility-administered medications for this visit.    Past Medical History  Diagnosis Date  . Hypertension   . Routine general medical examination at a health care facility   . History of ETOH abuse   . Non-ischemic cardiomyopathy     Presumed secondary to Etoh and HTN  (Prev EF 20-25% -- improved to 50%)  . Anomalous right coronary artery   . OSA (obstructive sleep apnea) 10/17/2013    Severe per home sleep study performed on 09/26/13.     History reviewed. No pertinent past surgical history.  Family History  Problem Relation Age of Onset  . Alcohol abuse Mother   . Heart disease Mother   . Alcohol abuse Father   . Diabetes Neg Hx   . Heart disease Maternal Grandmother   . Heart failure Father   . Stroke Maternal Grandmother     History   Social History  . Marital Status: Married    Spouse Name: [name]    Number of Children: 3  . Years of Education: N/A   Occupational History  . Retired Radiographer, therapeutic    Social History Main Topics  . Smoking status: Former Smoker -- 20 years    Quit date: 04/12/2002  . Smokeless tobacco: Never Used  . Alcohol Use: No     Comment: Quit ETOH 18 months  . Drug Use: No    Comment: history of marijuana use  . Sexual Activity: Not on file   Other Topics Concern  . Not on file   Social History Narrative   2 sons and daughter   1 son in [name] [place] [employer]. Living with wife 59yrs [name]).  Use to live near [place].    Review of Systems:  All systems reviewed.  They are negative to the above problem except as previously stated.  Vital Signs: BP 121/90 mmHg  Pulse 67  Ht  (1.778 m)  Wt 211 lb (95.709 kg)  BMI 30.28 kg/m2  SpO2 96%  Physical Exam Patinet is in NAD    HEENT:  Normocephalic, atraumatic. EOMI, PERRLA.  Neck: JVP is normal.  No bruits.  Lungs: clear to auscultation. No rales no wheezes.  Heart: Regular rate and rhythm. Normal S1, S2. No S3.   No significant murmurs. PMI not displaced.  Abdomen:  Supple, nontender. Normal bowel sounds. No masses. No hepatomegaly.  Extremities:   Good distal pulses throughout. No lower extremity edema.  Musculoskeletal :moving all extremities.  Neuro:   alert and oriented x3.  CN II-XII grossly intact.  EKG  SR   67 bpm  First degree AV block  PR 218 msec  T wave inversion I, L, V5/V6   Assessment and Plan:  1. Chronic systolic CHF  Volume status looks good  I would recomm adding aldactone 12.5 to his regimen  Stop KCL F/U BMET in 10 days  2.  Lipids  Patinet needs to be on a statin  WOuld add lipitor to regimen  10 mg  F/U lipids in 8 wks with AST  3.  HTN  Needs to be followed     4.  Sleep apnea  Feeling better on CPAP

## 2014-04-23 ENCOUNTER — Telehealth: Payer: Self-pay | Admitting: Internal Medicine

## 2014-04-23 NOTE — Telephone Encounter (Signed)
New Msg         Pt states he was informed by pharmacy that he shouldn't take new prescription given yesterday  Pt wants pharmacy called so this can be discussed.  Pt uses Psychologist, forensic on [address] [facility] [address].

## 2014-04-23 NOTE — Telephone Encounter (Signed)
Called the pharmacy. Was unable to get a person on the phone, cycled through the recording x3.  Will retry.

## 2014-04-24 ENCOUNTER — Telehealth: Payer: Self-pay | Admitting: Family

## 2014-04-24 ENCOUNTER — Encounter: Payer: Self-pay | Admitting: Family

## 2014-04-24 ENCOUNTER — Ambulatory Visit (INDEPENDENT_AMBULATORY_CARE_PROVIDER_SITE_OTHER): Payer: Medicare Other | Admitting: Family

## 2014-04-24 VITALS — BP 140/98 | HR 65 | Temp 97.7°F | Resp 18 | Ht 70.0 in | Wt 212.6 lb

## 2014-04-24 DIAGNOSIS — R739 Hyperglycemia, unspecified: Secondary | ICD-10-CM | POA: Diagnosis not present

## 2014-04-24 DIAGNOSIS — I10 Essential (primary) hypertension: Secondary | ICD-10-CM | POA: Diagnosis not present

## 2014-04-24 DIAGNOSIS — G4733 Obstructive sleep apnea (adult) (pediatric): Secondary | ICD-10-CM

## 2014-04-24 NOTE — Progress Notes (Signed)
Pre visit review using our clinic review tool, if applicable. No additional management support is needed unless otherwise documented below in the visit note. 

## 2014-04-24 NOTE — Telephone Encounter (Signed)
Called patient #. Was informed he is at work until 1pm then has a doctor appointment at 2 pm.  Spoke with pharmacist at [facility].  Flagged aldactone as potential for hyperkalemia since patient has aldactone, lasix and potassium ordered through their pharmacy.  Advised the potassium was dc'd, and that patient is set up for labs on 04/29/14.  Pharmacist removed flag and will fill aldactone.

## 2014-04-24 NOTE — Telephone Encounter (Signed)
Left message on answer machine for patient to call back to inform he can go to [facility] to pick up aldactone.

## 2014-04-24 NOTE — Assessment & Plan Note (Signed)
BP up, cardiology recently added aldactone and has follow up bmet scheduled.

## 2014-04-24 NOTE — Assessment & Plan Note (Signed)
+   CPAP compliance, reports feeling much better since starting CPAP. Will request download from DME.

## 2014-04-24 NOTE — Patient Instructions (Signed)
Please complete lab work prior to leaving.   Please schedule a follow up appointment in 3 months.  

## 2014-04-24 NOTE — Telephone Encounter (Signed)
Could you please request 4 week download report from DME company for CPAP?

## 2014-04-24 NOTE — Assessment & Plan Note (Signed)
Obtain A1c today. 

## 2014-04-24 NOTE — Progress Notes (Signed)
Subjective:    Patient ID: [name], male    DOB: 04/07/1946, 69 y.o.   MRN: [id number]  HPI  [name] is here a 6 month follow up for OSA.  1. OSA: Has had CPAP for 4-5 weeks. He is using it nightly and getting used to it. He feels much more well-rested in the morning.  2.  Hyperglycemia-  Lab Results  Component Value Date   HGBA1C 5.7* 09/25/2013    Review of Systems   see HPI  Past Medical History  Diagnosis Date  . Hypertension   . Routine general medical examination at a health care facility   . History of ETOH abuse   . Non-ischemic cardiomyopathy     Presumed secondary to Etoh and HTN  (Prev EF 20-25% -- improved to 50%)  . Anomalous right coronary artery   . OSA (obstructive sleep apnea) 10/17/2013    Severe per home sleep study performed on 09/26/13.     History   Social History  . Marital Status: Married    Spouse Name: [name]    Number of Children: 3  . Years of Education: N/A   Occupational History  . Retired Radiographer, therapeutic    Social History Main Topics  . Smoking status: Former Smoker -- 20 years    Quit date: 04/12/2002  . Smokeless tobacco: Never Used  . Alcohol Use: No     Comment: Quit ETOH 18 months  . Drug Use: No     Comment: history of marijuana use  . Sexual Activity: Not on file   Other Topics Concern  . Not on file   Social History Narrative   2 sons and daughter   1 son in [name]. Living with wife 66yrs [name]).  Use to live near [place].    History reviewed. No pertinent past surgical history.  Family History  Problem Relation Age of Onset  . Alcohol abuse Mother   . Heart disease Mother   . Alcohol abuse Father   . Diabetes Neg Hx   . Heart disease Maternal Grandmother   . Heart failure Father   . Stroke Maternal Grandmother     No Known Allergies  Current Outpatient Prescriptions on File Prior to Visit  Medication Sig Dispense Refill  . atorvastatin (LIPITOR) 10 MG tablet Take 1 tablet (10 mg total) by mouth daily.  30 tablet 9  . carvedilol (COREG) 25 MG tablet TAKE ONE TABLET BY MOUTH TWICE DAILY 180 tablet 1  . furosemide (LASIX) 20 MG tablet TAKE ONE TABLET BY MOUTH ONCE DAILY 90 tablet 1  . lisinopril (PRINIVIL,ZESTRIL) 5 MG tablet TAKE ONE TABLET BY MOUTH TWICE DAILY 180 tablet 1  . spironolactone (ALDACTONE) 25 MG tablet Take 0.5 tablets (12.5 mg total) by mouth daily. 15 tablet 9   No current facility-administered medications on file prior to visit.    BP 140/98 mmHg  Pulse 65  Temp(Src) 97.7 F (36.5 C) (Oral)  Resp 18  Ht 5\' 10"  (1.778 m)  Wt 212 lb 9.6 oz (96.435 kg)  BMI 30.51 kg/m2  SpO2 96%    Objective:   Physical Exam  Constitutional: He is oriented to person, place, and time. He appears well-developed and well-nourished.  HENT:  Head: Normocephalic and atraumatic.  Cardiovascular: Normal rate and regular rhythm.   No murmur heard. Pulmonary/Chest: Effort normal and breath sounds normal. No respiratory distress. He has no wheezes. He has no rales. He exhibits no tenderness.  Neurological: He is alert  and oriented to person, place, and time.  Psychiatric: He has a normal mood and affect. His behavior is normal. Judgment and thought content normal.          Assessment & Plan:

## 2014-04-25 ENCOUNTER — Encounter: Payer: Self-pay | Admitting: Family

## 2014-04-25 LAB — HEMOGLOBIN A1C: HEMOGLOBIN A1C: 5.7 % (ref 4.6–6.5)

## 2014-04-26 NOTE — Telephone Encounter (Signed)
Spoke with Selena Batten at [facility] [phone], she states they will fax download. Awaiting report.

## 2014-04-29 ENCOUNTER — Other Ambulatory Visit (INDEPENDENT_AMBULATORY_CARE_PROVIDER_SITE_OTHER): Payer: Medicare Other | Admitting: *Deleted

## 2014-04-29 DIAGNOSIS — I10 Essential (primary) hypertension: Secondary | ICD-10-CM | POA: Diagnosis not present

## 2014-04-29 LAB — BASIC METABOLIC PANEL
BUN: 14 mg/dL (ref 6–23)
CALCIUM: 9.5 mg/dL (ref 8.4–10.5)
CO2: 28 mEq/L (ref 19–32)
Chloride: 105 mEq/L (ref 96–112)
Creatinine, Ser: 1.12 mg/dL (ref 0.40–1.50)
GFR: 84.03 mL/min (ref >60.00)
Glucose, Bld: 91 mg/dL (ref 70–99)
Potassium: 4.2 mEq/L (ref 3.5–5.1)
Sodium: 139 mEq/L (ref 135–145)

## 2014-04-30 NOTE — Telephone Encounter (Signed)
Received form from [facility] that is required for continued coverage criteria for Positive Airway Pressure (PAP) therapy under Medicare. Form completed and forwarded to [facility]. JG//CMA

## 2014-05-01 ENCOUNTER — Telehealth: Payer: Self-pay | Admitting: Internal Medicine

## 2014-05-01 MED ORDER — SPIRONOLACTONE 25 MG PO TABS: 12.5000 mg | ORAL_TABLET | Freq: Every day | ORAL | Status: DC

## 2014-05-01 NOTE — Telephone Encounter (Signed)
Faxed to [facility]. Faxed 05/01/2014 [phone]. Original sent for scan.

## 2014-05-01 NOTE — Telephone Encounter (Signed)
Discussed lab results with patient.    Notes Recorded by Pricilla Riffle, MD on 04/29/2014 at 10:25 PM Potassium is OK Keep on same meds as doing now.  Pt verbalizes understanding and agreement. He had picked up the aldactone and is taking 1/2 tablet daily. Reordered for 90 day supply to his pharmacy.

## 2014-05-01 NOTE — Telephone Encounter (Signed)
Pt informed of blood work results. He will continue aldactone 12.5 mg daily. New order sent for 90 day supply sent to patient's pharmacy.

## 2014-05-01 NOTE — Telephone Encounter (Signed)
New Message  Pt called requests a call back to discuss lab results and if he should continue the medication. Please call back to discuss

## 2014-06-24 ENCOUNTER — Other Ambulatory Visit (INDEPENDENT_AMBULATORY_CARE_PROVIDER_SITE_OTHER): Payer: Medicare Other | Admitting: *Deleted

## 2014-06-24 DIAGNOSIS — E785 Hyperlipidemia, unspecified: Secondary | ICD-10-CM | POA: Diagnosis not present

## 2014-06-24 LAB — LIPID PANEL
CHOLESTEROL: 186 mg/dL (ref 0–200)
HDL: 57.8 mg/dL (ref >39.00)
LDL Cholesterol: 114 mg/dL — ABNORMAL HIGH (ref 0–99)
NONHDL: 128.2
Total CHOL/HDL Ratio: 3
Triglycerides: 72 mg/dL (ref 0.0–149.0)
VLDL: 14.4 mg/dL (ref 0.0–40.0)

## 2014-06-24 LAB — AST: AST: 15 U/L (ref 0–37)

## 2014-06-27 ENCOUNTER — Other Ambulatory Visit: Payer: Self-pay

## 2014-06-27 DIAGNOSIS — R739 Hyperglycemia, unspecified: Secondary | ICD-10-CM

## 2014-06-27 DIAGNOSIS — Z13220 Encounter for screening for lipoid disorders: Secondary | ICD-10-CM

## 2014-06-27 MED ORDER — ATORVASTATIN CALCIUM 40 MG PO TABS: 40.0000 mg | ORAL_TABLET | Freq: Every day | ORAL | Status: DC

## 2014-07-01 DIAGNOSIS — N529 Male erectile dysfunction, unspecified: Secondary | ICD-10-CM | POA: Diagnosis not present

## 2014-07-08 ENCOUNTER — Telehealth: Payer: Self-pay | Admitting: Internal Medicine

## 2014-07-08 NOTE — Telephone Encounter (Signed)
Pt c/o medication issue:  1. Name of Medication: Atorvastatin calcium 40mg   2. How are you currently taking this medication (dosage and times per day)? 1 tablets daily  3. Are you having a reaction (difficulty breathing--STAT)? no  4. What is your medication issue? Pt was urinating more frequency and it was causing his kidney to be sore. So pt stop taking this medication and started back taking the medication she took him off of which is Spironolactone 25mg  taking 1/2 tablet a day. Please advise pt

## 2014-07-08 NOTE — Telephone Encounter (Signed)
LMTCB

## 2014-07-08 NOTE — Telephone Encounter (Signed)
ERROR

## 2014-07-08 NOTE — Telephone Encounter (Signed)
Patient called in to report that since he started Atorvastatin, he had been "urinating with more frequency and it was causing his kidney to be sore". Patient STOPPED taking his Atorvastatin and started back taking Spironolactone 25mg  - taking 1/2 tablet a day. He said his urinary frequency and "kidney pain" have resolved now. Patient wants Dr. Tenny Craw to know what he did and to please advise further.  He can be reached at:  [phone] (cell phone).  I'm not sure why he thought Spironolactone was discontinued, as the Medication Activity List shows that it was still ordered for him.   Atorvastatin Calcium (Tab) LIPITOR 40 MG Take 1 tablet (40 mg total) by mouth daily. Increase in dose      Carvedilol (Tab) COREG 25 MG TAKE ONE TABLET BY MOUTH TWICE DAILY      Furosemide (Tab) LASIX 20 MG TAKE ONE TABLET BY MOUTH ONCE DAILY      Lisinopril (Tab) PRINIVIL,ZESTRIL 5 MG TAKE ONE TABLET BY MOUTH TWICE DAILY      Spironolactone (Tab) ALDACTONE 25 MG Take 0.5 tablets (12.5 mg total) by mouth daily.

## 2014-07-09 MED ORDER — ATORVASTATIN CALCIUM 10 MG PO TABS: 10.0000 mg | ORAL_TABLET | Freq: Every day | ORAL | Status: DC

## 2014-07-09 NOTE — Telephone Encounter (Signed)
Lengthy conversation with patient. He had NEVER started the Lipitor when it was first prescribed in Jan.  Never picked it up from pharmacy.  When we called him to increase the statin on 3/17, he picked up the 40 mg pills and started them and stopped spironolactone because he thought the Lipitor was to be in place of spironolactone. After 2 days he started to have low uop, and pain to bilat flank area and concentrated urine so he stopped the Lipitor because it was the only new thing he took.  He also increased his fluid intake to 64 oz a day and started back on the spironolactone.  His symptoms resolved.  Pt will pick up Lipitor 10 mg and will have labs checked in 8 weeks (08/26/14) and he will continue to stay on spironolactone as well. Educated on these medications.  Pt verbalizes understanding, agreement and appreciation for the help.

## 2014-07-10 ENCOUNTER — Other Ambulatory Visit: Payer: Self-pay | Admitting: Family

## 2014-07-10 NOTE — Telephone Encounter (Signed)
Rx request to pharmacy/SLS  

## 2014-08-12 ENCOUNTER — Other Ambulatory Visit: Payer: Federal, State, Local not specified - PPO

## 2014-08-26 ENCOUNTER — Other Ambulatory Visit (INDEPENDENT_AMBULATORY_CARE_PROVIDER_SITE_OTHER): Payer: Medicare Other | Admitting: *Deleted

## 2014-08-26 ENCOUNTER — Other Ambulatory Visit: Payer: Self-pay | Admitting: *Deleted

## 2014-08-26 DIAGNOSIS — Z13220 Encounter for screening for lipoid disorders: Secondary | ICD-10-CM

## 2014-08-26 DIAGNOSIS — R739 Hyperglycemia, unspecified: Secondary | ICD-10-CM

## 2014-08-26 DIAGNOSIS — I10 Essential (primary) hypertension: Secondary | ICD-10-CM

## 2014-08-26 LAB — LIPID PANEL
Cholesterol: 136 mg/dL (ref 0–200)
HDL: 59 mg/dL (ref >39.00)
LDL Cholesterol: 60 mg/dL (ref 0–99)
NONHDL: 77
Total CHOL/HDL Ratio: 2
Triglycerides: 85 mg/dL (ref 0.0–149.0)
VLDL: 17 mg/dL (ref 0.0–40.0)

## 2014-08-26 NOTE — Addendum Note (Signed)
Addended by: Tonita Phoenix on: 08/26/2014 10:15 AM   Modules accepted: Orders

## 2014-08-26 NOTE — Addendum Note (Signed)
Addended by: Tonita Phoenix on: 08/26/2014 10:14 AM   Modules accepted: Orders

## 2014-10-03 ENCOUNTER — Telehealth: Payer: Self-pay | Admitting: Family

## 2014-10-03 MED ORDER — CARVEDILOL 25 MG PO TABS: 25.0000 mg | ORAL_TABLET | Freq: Two times a day (BID) | ORAL | Status: DC

## 2014-10-03 NOTE — Telephone Encounter (Signed)
30 day supply of carvedilol sent to [facility]. Pt was due for 3 month follow up in April and is past due. Please call pt to arrange appointment before further refills are needed.

## 2014-10-03 NOTE — Telephone Encounter (Signed)
Pt lvm advising pt to call back and scheduled follow up appointment

## 2014-10-03 NOTE — Telephone Encounter (Signed)
Relation to pt: self Call back number: [phone] Pharmacy:  [facility] [address] [place] - [address] [phone] (Phone) [phone] (Fax)        Reason for call:  Pt requeseting a refill carvedilol (COREG) 25 MG tablet

## 2014-10-20 ENCOUNTER — Other Ambulatory Visit: Payer: Self-pay | Admitting: Family

## 2014-10-22 ENCOUNTER — Telehealth: Payer: Self-pay | Admitting: *Deleted

## 2014-10-22 MED ORDER — FUROSEMIDE 20 MG PO TABS: 20.0000 mg | ORAL_TABLET | Freq: Every day | ORAL | Status: DC

## 2014-10-22 NOTE — Telephone Encounter (Signed)
Received fax from [facility] for refill of furosemide 20mg .  Pt has f/u 10/28/14.  Refill sent.

## 2014-10-28 ENCOUNTER — Ambulatory Visit: Payer: Medicare Other | Admitting: Family

## 2014-11-06 ENCOUNTER — Ambulatory Visit (INDEPENDENT_AMBULATORY_CARE_PROVIDER_SITE_OTHER): Payer: Medicare Other | Admitting: Family

## 2014-11-06 ENCOUNTER — Telehealth: Payer: Self-pay | Admitting: Family

## 2014-11-06 ENCOUNTER — Encounter: Payer: Self-pay | Admitting: Family

## 2014-11-06 VITALS — BP 120/80 | HR 71 | Temp 98.1°F | Resp 16 | Ht 70.0 in | Wt 217.4 lb

## 2014-11-06 DIAGNOSIS — G4733 Obstructive sleep apnea (adult) (pediatric): Secondary | ICD-10-CM | POA: Diagnosis not present

## 2014-11-06 DIAGNOSIS — R739 Hyperglycemia, unspecified: Secondary | ICD-10-CM

## 2014-11-06 DIAGNOSIS — I10 Essential (primary) hypertension: Secondary | ICD-10-CM | POA: Diagnosis not present

## 2014-11-06 DIAGNOSIS — E876 Hypokalemia: Secondary | ICD-10-CM

## 2014-11-06 LAB — BASIC METABOLIC PANEL
BUN: 15 mg/dL (ref 6–23)
CALCIUM: 9.3 mg/dL (ref 8.4–10.5)
CO2: 30 meq/L (ref 19–32)
CREATININE: 1.12 mg/dL (ref 0.40–1.50)
Chloride: 105 mEq/L (ref 96–112)
GFR: 83.9 mL/min (ref >60.00)
Glucose, Bld: 82 mg/dL (ref 70–99)
POTASSIUM: 3.4 meq/L — AB (ref 3.5–5.1)
Sodium: 142 mEq/L (ref 135–145)

## 2014-11-06 LAB — HEMOGLOBIN A1C: Hgb A1c MFr Bld: 5.6 % (ref 4.6–6.5)

## 2014-11-06 MED ORDER — POTASSIUM CHLORIDE ER 10 MEQ PO TBCR: 10.0000 meq | EXTENDED_RELEASE_TABLET | Freq: Every day | ORAL | Status: DC

## 2014-11-06 MED ORDER — CARVEDILOL 25 MG PO TABS: 25.0000 mg | ORAL_TABLET | Freq: Two times a day (BID) | ORAL | Status: DC

## 2014-11-06 NOTE — Assessment & Plan Note (Signed)
Clinically stable.  Obtain follow up A1C.  

## 2014-11-06 NOTE — Telephone Encounter (Signed)
Potassium is mildly low. Advised that he start low dose potassium supplement. Repeat bmet in 2 weeks, dx hypokalemia. 30

## 2014-11-06 NOTE — Assessment & Plan Note (Signed)
Stable on CPAP, continue same. 

## 2014-11-06 NOTE — Progress Notes (Signed)
Pre visit review using our clinic review tool, if applicable. No additional management support is needed unless otherwise documented below in the visit note. 

## 2014-11-06 NOTE — Progress Notes (Signed)
Subjective:    Patient ID: [name], male    DOB: 1945-11-07, 69 y.o.   MRN: [id number]  HPI  [name] is a 69 yr old male who presents today for follow up.  HTN- maintained on lisinopril, lasix, coreg and aldactone.   BP Readings from Last 3 Encounters:  11/06/14 120/80  04/24/14 140/98  04/22/14 121/90   Hyperglycemia-   Lab Results  Component Value Date   HGBA1C 5.7 04/24/2014   OSA- maintained on CPAP.  Good compliance.   Hyperlipidemia- on atorvastatin .   Lab Results  Component Value Date   CHOL 136 08/26/2014   HDL 59.00 08/26/2014   LDLCALC 60 08/26/2014   TRIG 85.0 08/26/2014   CHOLHDL 2 08/26/2014      Review of Systems    see HPI  Past Medical History  Diagnosis Date  . Hypertension   . Routine general medical examination at a health care facility   . History of ETOH abuse   . Non-ischemic cardiomyopathy     Presumed secondary to Etoh and HTN  (Prev EF 20-25% -- improved to 50%)  . Anomalous right coronary artery   . OSA (obstructive sleep apnea) 10/17/2013    Severe per home sleep study performed on 09/26/13.     History   Social History  . Marital Status: Married    Spouse Name: [name]  . Number of Children: 3  . Years of Education: N/A   Occupational History  . Retired Radiographer, therapeutic    Social History Main Topics  . Smoking status: Former Smoker -- 20 years    Quit date: 04/12/2002  . Smokeless tobacco: Never Used  . Alcohol Use: No     Comment: Quit ETOH 18 months  . Drug Use: No     Comment: history of marijuana use  . Sexual Activity: Not on file   Other Topics Concern  . Not on file   Social History Narrative   2 sons and daughter   1 son in [name]. Living with wife 27yrs [name]).  Use to live near [place].    No past surgical history on file.  Family History  Problem Relation Age of Onset  . Alcohol abuse Mother   . Heart disease Mother   . Alcohol abuse Father   . Diabetes Neg Hx   . Heart disease Maternal  Grandmother   . Heart failure Father   . Stroke Maternal Grandmother     No Known Allergies  Current Outpatient Prescriptions on File Prior to Visit  Medication Sig Dispense Refill  . atorvastatin (LIPITOR) 10 MG tablet Take 1 tablet (10 mg total) by mouth daily at 6 PM. 90 tablet 3  . furosemide (LASIX) 20 MG tablet Take 1 tablet (20 mg total) by mouth daily. 90 tablet 1  . lisinopril (PRINIVIL,ZESTRIL) 5 MG tablet TAKE ONE TABLET BY MOUTH TWICE DAILY 180 tablet 1  . spironolactone (ALDACTONE) 25 MG tablet Take 0.5 tablets (12.5 mg total) by mouth daily. 45 tablet 3   No current facility-administered medications on file prior to visit.    BP 120/80 mmHg  Pulse 71  Temp(Src) 98.1 F (36.7 C) (Oral)  Resp 16  Ht  (1.778 m)  Wt 217 lb 6.4 oz (98.612 kg)  BMI 31.19 kg/m2  SpO2 97%    Objective:   Physical Exam  Constitutional: He is oriented to person, place, and time. He appears well-developed and well-nourished. No distress.  HENT:  Head: Normocephalic  and atraumatic.  Cardiovascular: Normal rate and regular rhythm.   No murmur heard. Pulmonary/Chest: Effort normal and breath sounds normal. No respiratory distress. He has no wheezes. He has no rales.  Musculoskeletal: He exhibits no edema.  Lymphadenopathy:    He has no cervical adenopathy.  Neurological: He is alert and oriented to person, place, and time.  Skin: Skin is warm and dry.  Psychiatric: He has a normal mood and affect. His behavior is normal. Thought content normal.          Assessment & Plan:

## 2014-11-06 NOTE — Assessment & Plan Note (Signed)
Stable on current meds. Continue same obtain bmet. 

## 2014-11-06 NOTE — Patient Instructions (Signed)
Follow up in December for medicare wellness visit. Please complete lab work prior to leaving.

## 2014-11-07 NOTE — Telephone Encounter (Signed)
Pt notified and made aware.  He agrees with plan.  Lab appointment scheduled on November 20, 2014 @ 9 am.  Future lab ordered.

## 2014-11-08 ENCOUNTER — Ambulatory Visit: Payer: Medicare Other | Admitting: Internal Medicine

## 2014-11-13 ENCOUNTER — Ambulatory Visit: Payer: Medicare Other | Admitting: Family

## 2014-11-20 ENCOUNTER — Other Ambulatory Visit (INDEPENDENT_AMBULATORY_CARE_PROVIDER_SITE_OTHER): Payer: Medicare Other

## 2014-11-20 DIAGNOSIS — E876 Hypokalemia: Secondary | ICD-10-CM

## 2014-11-20 LAB — BASIC METABOLIC PANEL
BUN: 13 mg/dL (ref 6–23)
CO2: 29 mEq/L (ref 19–32)
Calcium: 9.2 mg/dL (ref 8.4–10.5)
Chloride: 107 mEq/L (ref 96–112)
Creatinine, Ser: 1.36 mg/dL (ref 0.40–1.50)
GFR: 67.05 mL/min (ref >60.00)
Glucose, Bld: 107 mg/dL — ABNORMAL HIGH (ref 70–99)
Potassium: 4.2 mEq/L (ref 3.5–5.1)
Sodium: 141 mEq/L (ref 135–145)

## 2014-11-21 ENCOUNTER — Encounter: Payer: Self-pay | Admitting: Family

## 2015-01-03 ENCOUNTER — Ambulatory Visit (INDEPENDENT_AMBULATORY_CARE_PROVIDER_SITE_OTHER): Payer: Medicare Other | Admitting: Internal Medicine

## 2015-01-03 ENCOUNTER — Encounter: Payer: Self-pay | Admitting: Internal Medicine

## 2015-01-03 VITALS — BP 128/76 | HR 72 | Ht 69.5 in | Wt 213.8 lb

## 2015-01-03 DIAGNOSIS — I5022 Chronic systolic (congestive) heart failure: Secondary | ICD-10-CM

## 2015-01-03 NOTE — Progress Notes (Signed)
Cardiology Office Note   Date:  01/03/2015   ID:  Rubert, Frediani 1945/08/02, MRN [id number]  PCP:  Lemont Fillers., NP  Cardiologist:   Dietrich Pates, MD   No chief complaint on file.     History of Present Illness: [name] is a 69 y.o. male with a history of Patinet is a 69 yo who is referred for continued medical Rx of congestive heart failure The patinet was seen back in 2008 by Daiva Nakayama He has not been seen by cardiolgy since Work up in past consistent with NICM CT scan did show some mild CAD  Last echo in November LVEF 30 to 35%    I saw the pt in Jan 2016   Since then he has been doing very good  No Cp  No signif SOB  No dizzines   Driving [employer].      Current Outpatient Prescriptions  Medication Sig Dispense Refill  . atorvastatin (LIPITOR) 10 MG tablet Take 1 tablet (10 mg total) by mouth daily at 6 PM. 90 tablet 3  . carvedilol (COREG) 25 MG tablet Take 1 tablet (25 mg total) by mouth 2 (two) times daily. 180 tablet 1  . furosemide (LASIX) 20 MG tablet Take 1 tablet (20 mg total) by mouth daily. 90 tablet 1  . lisinopril (PRINIVIL,ZESTRIL) 5 MG tablet TAKE ONE TABLET BY MOUTH TWICE DAILY 180 tablet 1  . potassium chloride (K-DUR) 10 MEQ tablet Take 1 tablet (10 mEq total) by mouth daily. 30 tablet 3  . spironolactone (ALDACTONE) 25 MG tablet Take 0.5 tablets (12.5 mg total) by mouth daily. 45 tablet 3   No current facility-administered medications for this visit.    Allergies:   Review of patient's allergies indicates no known allergies.   Past Medical History  Diagnosis Date  . Hypertension   . Routine general medical examination at a health care facility   . History of ETOH abuse   . Non-ischemic cardiomyopathy     Presumed secondary to Etoh and HTN  (Prev EF 20-25% -- improved to 50%)  . Anomalous right coronary artery   . OSA (obstructive sleep apnea) 10/17/2013    Severe per home sleep study performed on 09/26/13.     No past  surgical history on file.   Social History:  The patient  reports that he quit smoking about 12 years ago. He has never used smokeless tobacco. He reports that he does not drink alcohol or use illicit drugs.   Family History:  The patient's family history includes Alcohol abuse in his father and mother; Heart disease in his maternal grandmother and mother; Heart failure in his father; Stroke in his maternal grandmother. There is no history of Diabetes.    ROS:  Please see the history of present illness. All other systems are reviewed and  Negative to the above problem except as noted.    PHYSICAL EXAM: VS:  BP 128/76 mmHg  Pulse 72  Ht 5' 9.5" (1.765 m)  Wt 213 lb 12.8 oz (96.979 kg)  BMI 31.13 kg/m2  SpO2 96%  GEN: Well nourished, well developed, in no acute distress HEENT: normal Neck: no JVD, carotid bruits, or masses Cardiac: RRR; no murmurs, rubs, or gallops,no edema  Respiratory:  clear to auscultation bilaterally, normal work of breathing GI: soft, nontender, nondistended, + BS  No hepatomegaly  MS: no deformity Moving all extremities   Skin: warm and dry, no rash Neuro:  Strength and sensation  are intact Psych: euthymic mood, full affect   EKG:  EKG is  Not ordered today.   Lipid Panel    Component Value Date/Time   CHOL 136 08/26/2014 1015   TRIG 85.0 08/26/2014 1015   HDL 59.00 08/26/2014 1015   CHOLHDL 2 08/26/2014 1015   VLDL 17.0 08/26/2014 1015   LDLCALC 60 08/26/2014 1015      Wt Readings from Last 3 Encounters:  01/03/15 213 lb 12.8 oz (96.979 kg)  11/06/14 217 lb 6.4 oz (98.612 kg)  04/24/14 212 lb 9.6 oz (96.435 kg)      ASSESSMENT AND PLAN: 1.  Chronic systolic CHF  Volume status is good  Feeling good  About to start exercise regimen on treadmill I would recomm one echo to reeval pumping function   Keep on same regimen    2.  Lipids  On lipitor  Lipid above    3.  HTN  Adequate control  I am reluctant to push lower given job.    4.   Sleep apnea    F/U in 9 months   Signed, Dietrich Pates, MD  01/03/2015 8:10 AM    [facility] [address] [place], [place], [place]  [address] Phone: [phone]; Fax: [phone]

## 2015-01-03 NOTE — Patient Instructions (Signed)
Medication Instruction  Your physician recommends that you continue on your current medications as directed. Please refer to the Current Medication list given to you today.  Labwork:  NONE ORDER TODAY  Testing/Procedures:  Your physician has requested that you have an echocardiogram. Echocardiography is a painless test that uses sound waves to create images of your heart. It provides your doctor with information about the size and shape of your heart and how well your heart's chambers and valves are working. This procedure takes approximately one hour. There are no restrictions for this procedure.  Follow-Up:  Your physician wants you to follow-up in:  IN 9  MONTHS WITH DR Tenny Craw You will receive a reminder letter in the mail two months in advance. If you don't receive a letter, please call our office to schedule the follow-up appointment.      Any Other Special Instructions Will Be Listed Below (If Applicable).

## 2015-02-05 ENCOUNTER — Ambulatory Visit (INDEPENDENT_AMBULATORY_CARE_PROVIDER_SITE_OTHER): Payer: Medicare Other

## 2015-02-05 ENCOUNTER — Encounter: Payer: Self-pay | Admitting: Family

## 2015-02-05 DIAGNOSIS — Z23 Encounter for immunization: Secondary | ICD-10-CM

## 2015-02-05 NOTE — Addendum Note (Signed)
Addended by: Lurline Hare on: 02/05/2015 09:52 AM   Modules accepted: Orders

## 2015-03-31 DIAGNOSIS — M5022 Other cervical disc displacement, mid-cervical region, unspecified level: Secondary | ICD-10-CM | POA: Diagnosis not present

## 2015-03-31 DIAGNOSIS — M5124 Other intervertebral disc displacement, thoracic region: Secondary | ICD-10-CM | POA: Diagnosis not present

## 2015-03-31 DIAGNOSIS — M9902 Segmental and somatic dysfunction of thoracic region: Secondary | ICD-10-CM | POA: Diagnosis not present

## 2015-03-31 DIAGNOSIS — M9901 Segmental and somatic dysfunction of cervical region: Secondary | ICD-10-CM | POA: Diagnosis not present

## 2015-04-01 DIAGNOSIS — M5124 Other intervertebral disc displacement, thoracic region: Secondary | ICD-10-CM | POA: Diagnosis not present

## 2015-04-01 DIAGNOSIS — M9902 Segmental and somatic dysfunction of thoracic region: Secondary | ICD-10-CM | POA: Diagnosis not present

## 2015-04-01 DIAGNOSIS — M9901 Segmental and somatic dysfunction of cervical region: Secondary | ICD-10-CM | POA: Diagnosis not present

## 2015-04-01 DIAGNOSIS — M5022 Other cervical disc displacement, mid-cervical region, unspecified level: Secondary | ICD-10-CM | POA: Diagnosis not present

## 2015-04-02 DIAGNOSIS — M9901 Segmental and somatic dysfunction of cervical region: Secondary | ICD-10-CM | POA: Diagnosis not present

## 2015-04-02 DIAGNOSIS — M5124 Other intervertebral disc displacement, thoracic region: Secondary | ICD-10-CM | POA: Diagnosis not present

## 2015-04-02 DIAGNOSIS — M9902 Segmental and somatic dysfunction of thoracic region: Secondary | ICD-10-CM | POA: Diagnosis not present

## 2015-04-02 DIAGNOSIS — M5022 Other cervical disc displacement, mid-cervical region, unspecified level: Secondary | ICD-10-CM | POA: Diagnosis not present

## 2015-04-03 DIAGNOSIS — M9901 Segmental and somatic dysfunction of cervical region: Secondary | ICD-10-CM | POA: Diagnosis not present

## 2015-04-03 DIAGNOSIS — M9902 Segmental and somatic dysfunction of thoracic region: Secondary | ICD-10-CM | POA: Diagnosis not present

## 2015-04-03 DIAGNOSIS — M5022 Other cervical disc displacement, mid-cervical region, unspecified level: Secondary | ICD-10-CM | POA: Diagnosis not present

## 2015-04-03 DIAGNOSIS — M5124 Other intervertebral disc displacement, thoracic region: Secondary | ICD-10-CM | POA: Diagnosis not present

## 2015-04-08 DIAGNOSIS — M5124 Other intervertebral disc displacement, thoracic region: Secondary | ICD-10-CM | POA: Diagnosis not present

## 2015-04-08 DIAGNOSIS — M9901 Segmental and somatic dysfunction of cervical region: Secondary | ICD-10-CM | POA: Diagnosis not present

## 2015-04-08 DIAGNOSIS — M9902 Segmental and somatic dysfunction of thoracic region: Secondary | ICD-10-CM | POA: Diagnosis not present

## 2015-04-08 DIAGNOSIS — M5022 Other cervical disc displacement, mid-cervical region, unspecified level: Secondary | ICD-10-CM | POA: Diagnosis not present

## 2015-04-09 DIAGNOSIS — M5124 Other intervertebral disc displacement, thoracic region: Secondary | ICD-10-CM | POA: Diagnosis not present

## 2015-04-09 DIAGNOSIS — M9902 Segmental and somatic dysfunction of thoracic region: Secondary | ICD-10-CM | POA: Diagnosis not present

## 2015-04-09 DIAGNOSIS — M5022 Other cervical disc displacement, mid-cervical region, unspecified level: Secondary | ICD-10-CM | POA: Diagnosis not present

## 2015-04-09 DIAGNOSIS — M9901 Segmental and somatic dysfunction of cervical region: Secondary | ICD-10-CM | POA: Diagnosis not present

## 2015-04-10 DIAGNOSIS — M5124 Other intervertebral disc displacement, thoracic region: Secondary | ICD-10-CM | POA: Diagnosis not present

## 2015-04-10 DIAGNOSIS — M5022 Other cervical disc displacement, mid-cervical region, unspecified level: Secondary | ICD-10-CM | POA: Diagnosis not present

## 2015-04-10 DIAGNOSIS — M9902 Segmental and somatic dysfunction of thoracic region: Secondary | ICD-10-CM | POA: Diagnosis not present

## 2015-04-10 DIAGNOSIS — M9901 Segmental and somatic dysfunction of cervical region: Secondary | ICD-10-CM | POA: Diagnosis not present

## 2015-04-15 DIAGNOSIS — M5022 Other cervical disc displacement, mid-cervical region, unspecified level: Secondary | ICD-10-CM | POA: Diagnosis not present

## 2015-04-15 DIAGNOSIS — M5124 Other intervertebral disc displacement, thoracic region: Secondary | ICD-10-CM | POA: Diagnosis not present

## 2015-04-15 DIAGNOSIS — M9902 Segmental and somatic dysfunction of thoracic region: Secondary | ICD-10-CM | POA: Diagnosis not present

## 2015-04-15 DIAGNOSIS — M9901 Segmental and somatic dysfunction of cervical region: Secondary | ICD-10-CM | POA: Diagnosis not present

## 2015-04-16 DIAGNOSIS — M5022 Other cervical disc displacement, mid-cervical region, unspecified level: Secondary | ICD-10-CM | POA: Diagnosis not present

## 2015-04-16 DIAGNOSIS — M5124 Other intervertebral disc displacement, thoracic region: Secondary | ICD-10-CM | POA: Diagnosis not present

## 2015-04-16 DIAGNOSIS — M9901 Segmental and somatic dysfunction of cervical region: Secondary | ICD-10-CM | POA: Diagnosis not present

## 2015-04-16 DIAGNOSIS — M9902 Segmental and somatic dysfunction of thoracic region: Secondary | ICD-10-CM | POA: Diagnosis not present

## 2015-04-17 DIAGNOSIS — M5022 Other cervical disc displacement, mid-cervical region, unspecified level: Secondary | ICD-10-CM | POA: Diagnosis not present

## 2015-04-17 DIAGNOSIS — M9901 Segmental and somatic dysfunction of cervical region: Secondary | ICD-10-CM | POA: Diagnosis not present

## 2015-04-17 DIAGNOSIS — M5124 Other intervertebral disc displacement, thoracic region: Secondary | ICD-10-CM | POA: Diagnosis not present

## 2015-04-17 DIAGNOSIS — M9902 Segmental and somatic dysfunction of thoracic region: Secondary | ICD-10-CM | POA: Diagnosis not present

## 2015-04-22 DIAGNOSIS — M5022 Other cervical disc displacement, mid-cervical region, unspecified level: Secondary | ICD-10-CM | POA: Diagnosis not present

## 2015-04-22 DIAGNOSIS — M5124 Other intervertebral disc displacement, thoracic region: Secondary | ICD-10-CM | POA: Diagnosis not present

## 2015-04-22 DIAGNOSIS — M9901 Segmental and somatic dysfunction of cervical region: Secondary | ICD-10-CM | POA: Diagnosis not present

## 2015-04-22 DIAGNOSIS — M9902 Segmental and somatic dysfunction of thoracic region: Secondary | ICD-10-CM | POA: Diagnosis not present

## 2015-04-23 DIAGNOSIS — M9901 Segmental and somatic dysfunction of cervical region: Secondary | ICD-10-CM | POA: Diagnosis not present

## 2015-04-23 DIAGNOSIS — M5022 Other cervical disc displacement, mid-cervical region, unspecified level: Secondary | ICD-10-CM | POA: Diagnosis not present

## 2015-04-23 DIAGNOSIS — M5124 Other intervertebral disc displacement, thoracic region: Secondary | ICD-10-CM | POA: Diagnosis not present

## 2015-04-23 DIAGNOSIS — M9902 Segmental and somatic dysfunction of thoracic region: Secondary | ICD-10-CM | POA: Diagnosis not present

## 2015-04-24 DIAGNOSIS — M5124 Other intervertebral disc displacement, thoracic region: Secondary | ICD-10-CM | POA: Diagnosis not present

## 2015-04-24 DIAGNOSIS — M9901 Segmental and somatic dysfunction of cervical region: Secondary | ICD-10-CM | POA: Diagnosis not present

## 2015-04-24 DIAGNOSIS — M5022 Other cervical disc displacement, mid-cervical region, unspecified level: Secondary | ICD-10-CM | POA: Diagnosis not present

## 2015-04-24 DIAGNOSIS — M9902 Segmental and somatic dysfunction of thoracic region: Secondary | ICD-10-CM | POA: Diagnosis not present

## 2015-04-25 ENCOUNTER — Other Ambulatory Visit: Payer: Self-pay | Admitting: Family

## 2015-04-29 DIAGNOSIS — M9902 Segmental and somatic dysfunction of thoracic region: Secondary | ICD-10-CM | POA: Diagnosis not present

## 2015-04-29 DIAGNOSIS — M9901 Segmental and somatic dysfunction of cervical region: Secondary | ICD-10-CM | POA: Diagnosis not present

## 2015-04-29 DIAGNOSIS — M5022 Other cervical disc displacement, mid-cervical region, unspecified level: Secondary | ICD-10-CM | POA: Diagnosis not present

## 2015-04-29 DIAGNOSIS — M5124 Other intervertebral disc displacement, thoracic region: Secondary | ICD-10-CM | POA: Diagnosis not present

## 2015-05-05 DIAGNOSIS — M9902 Segmental and somatic dysfunction of thoracic region: Secondary | ICD-10-CM | POA: Diagnosis not present

## 2015-05-05 DIAGNOSIS — M5124 Other intervertebral disc displacement, thoracic region: Secondary | ICD-10-CM | POA: Diagnosis not present

## 2015-05-05 DIAGNOSIS — M9901 Segmental and somatic dysfunction of cervical region: Secondary | ICD-10-CM | POA: Diagnosis not present

## 2015-05-05 DIAGNOSIS — M5022 Other cervical disc displacement, mid-cervical region, unspecified level: Secondary | ICD-10-CM | POA: Diagnosis not present

## 2015-05-06 DIAGNOSIS — M9901 Segmental and somatic dysfunction of cervical region: Secondary | ICD-10-CM | POA: Diagnosis not present

## 2015-05-06 DIAGNOSIS — M9902 Segmental and somatic dysfunction of thoracic region: Secondary | ICD-10-CM | POA: Diagnosis not present

## 2015-05-06 DIAGNOSIS — M5124 Other intervertebral disc displacement, thoracic region: Secondary | ICD-10-CM | POA: Diagnosis not present

## 2015-05-06 DIAGNOSIS — M5022 Other cervical disc displacement, mid-cervical region, unspecified level: Secondary | ICD-10-CM | POA: Diagnosis not present

## 2015-05-09 ENCOUNTER — Other Ambulatory Visit: Payer: Self-pay

## 2015-05-09 ENCOUNTER — Ambulatory Visit (HOSPITAL_COMMUNITY): Payer: Medicare Other | Attending: Internal Medicine

## 2015-05-09 DIAGNOSIS — I517 Cardiomegaly: Secondary | ICD-10-CM | POA: Insufficient documentation

## 2015-05-09 DIAGNOSIS — I5022 Chronic systolic (congestive) heart failure: Secondary | ICD-10-CM | POA: Diagnosis not present

## 2015-05-09 DIAGNOSIS — I10 Essential (primary) hypertension: Secondary | ICD-10-CM | POA: Diagnosis not present

## 2015-05-09 DIAGNOSIS — I351 Nonrheumatic aortic (valve) insufficiency: Secondary | ICD-10-CM | POA: Diagnosis not present

## 2015-05-09 DIAGNOSIS — E785 Hyperlipidemia, unspecified: Secondary | ICD-10-CM | POA: Insufficient documentation

## 2015-05-12 DIAGNOSIS — M9902 Segmental and somatic dysfunction of thoracic region: Secondary | ICD-10-CM | POA: Diagnosis not present

## 2015-05-12 DIAGNOSIS — M5124 Other intervertebral disc displacement, thoracic region: Secondary | ICD-10-CM | POA: Diagnosis not present

## 2015-05-12 DIAGNOSIS — M5022 Other cervical disc displacement, mid-cervical region, unspecified level: Secondary | ICD-10-CM | POA: Diagnosis not present

## 2015-05-12 DIAGNOSIS — M9901 Segmental and somatic dysfunction of cervical region: Secondary | ICD-10-CM | POA: Diagnosis not present

## 2015-05-13 DIAGNOSIS — M9901 Segmental and somatic dysfunction of cervical region: Secondary | ICD-10-CM | POA: Diagnosis not present

## 2015-05-13 DIAGNOSIS — M5022 Other cervical disc displacement, mid-cervical region, unspecified level: Secondary | ICD-10-CM | POA: Diagnosis not present

## 2015-05-13 DIAGNOSIS — M9902 Segmental and somatic dysfunction of thoracic region: Secondary | ICD-10-CM | POA: Diagnosis not present

## 2015-05-13 DIAGNOSIS — M5124 Other intervertebral disc displacement, thoracic region: Secondary | ICD-10-CM | POA: Diagnosis not present

## 2015-05-19 DIAGNOSIS — M9902 Segmental and somatic dysfunction of thoracic region: Secondary | ICD-10-CM | POA: Diagnosis not present

## 2015-05-19 DIAGNOSIS — M5022 Other cervical disc displacement, mid-cervical region, unspecified level: Secondary | ICD-10-CM | POA: Diagnosis not present

## 2015-05-19 DIAGNOSIS — M9901 Segmental and somatic dysfunction of cervical region: Secondary | ICD-10-CM | POA: Diagnosis not present

## 2015-05-19 DIAGNOSIS — M5124 Other intervertebral disc displacement, thoracic region: Secondary | ICD-10-CM | POA: Diagnosis not present

## 2015-05-20 DIAGNOSIS — M5022 Other cervical disc displacement, mid-cervical region, unspecified level: Secondary | ICD-10-CM | POA: Diagnosis not present

## 2015-05-20 DIAGNOSIS — M5124 Other intervertebral disc displacement, thoracic region: Secondary | ICD-10-CM | POA: Diagnosis not present

## 2015-05-20 DIAGNOSIS — M9901 Segmental and somatic dysfunction of cervical region: Secondary | ICD-10-CM | POA: Diagnosis not present

## 2015-05-20 DIAGNOSIS — M9902 Segmental and somatic dysfunction of thoracic region: Secondary | ICD-10-CM | POA: Diagnosis not present

## 2015-05-27 ENCOUNTER — Telehealth: Payer: Self-pay | Admitting: Family

## 2015-05-27 NOTE — Telephone Encounter (Signed)
Relation to EO:FHQR Call back [phone]   Reason for call:  Patient scheduled medicare wellness for 06/06/2015 at 8:45pm

## 2015-05-27 NOTE — Telephone Encounter (Signed)
Noted! Thank you

## 2015-05-30 ENCOUNTER — Other Ambulatory Visit: Payer: Self-pay | Admitting: Internal Medicine

## 2015-05-30 ENCOUNTER — Other Ambulatory Visit: Payer: Self-pay | Admitting: Family

## 2015-05-30 NOTE — Telephone Encounter (Signed)
30 day supply of potassium sent to pharmacy.  Pt was due for fasting medicare wellness exam in December and is past due. He will need this before further appts are due. Please call pt to arrange appt.

## 2015-05-30 NOTE — Telephone Encounter (Signed)
Called pt. Wellness visit is scheduled for 06/20/15 @ 8:45

## 2015-06-06 ENCOUNTER — Ambulatory Visit: Payer: Medicare Other

## 2015-06-20 ENCOUNTER — Ambulatory Visit (INDEPENDENT_AMBULATORY_CARE_PROVIDER_SITE_OTHER): Payer: Medicare Other

## 2015-06-20 VITALS — BP 108/60 | HR 79 | Ht 70.0 in | Wt 214.8 lb

## 2015-06-20 DIAGNOSIS — Z1159 Encounter for screening for other viral diseases: Secondary | ICD-10-CM

## 2015-06-20 DIAGNOSIS — Z0000 Encounter for general adult medical examination without abnormal findings: Secondary | ICD-10-CM | POA: Diagnosis not present

## 2015-06-20 NOTE — Progress Notes (Signed)
Pre visit review using our clinic review tool, if applicable. No additional management support is needed unless otherwise documented below in the visit note. 

## 2015-06-20 NOTE — Progress Notes (Signed)
Subjective:   [name] is a 70 y.o. male who presents for Medicare Annual/Subsequent preventive examination.  Review of Systems: No ROS Cardiac Risk Factors include: male gender;obesity (BMI >30kg/m2);family history of premature cardiovascular disease;advanced age (>39men, >36 women);hypertension Sleep patterns:   Wears CPAP.  Sleeps at least 8 hours per night.   Home Safety/Smoke Alarms:  Feels safe at home.  Lives with wife in 3 level home.  Smoke alarms present.   Firearm Safety:  Kept in safe place.   Seat Belt Safety/Bike Helmet:  Always wears seat belt.    Counseling:   Dental- Goes every 3-4 months.  Upcoming dental work in the next 6 months.  Male:  CCS-05/22/07-normal; repeat in 10 years.    PSA-10/23/2012--1.04       Objective:    Vitals: BP 108/60 mmHg  Pulse 79  Ht  (1.778 m)  Wt 214 lb 12.8 oz (97.433 kg)  BMI 30.82 kg/m2  SpO2 96%  Tobacco History  Smoking status  . Former Smoker -- 20 years  . Quit date: 04/12/2002  Smokeless tobacco  . Never Used     Counseling given: No   Past Medical History  Diagnosis Date  . Hypertension   . Routine general medical examination at a health care facility   . History of ETOH abuse   . Non-ischemic cardiomyopathy (HCC)     Presumed secondary to Etoh and HTN  (Prev EF 20-25% -- improved to 50%)  . Anomalous right coronary artery   . OSA (obstructive sleep apnea) 10/17/2013    Severe per home sleep study performed on 09/26/13.    History reviewed. No pertinent past surgical history. Family History  Problem Relation Age of Onset  . Alcohol abuse Mother   . Heart disease Mother   . Alcohol abuse Father   . Diabetes Neg Hx   . Heart disease Maternal Grandmother   . Heart failure Father   . Stroke Maternal Grandmother    History  Sexual Activity  . Sexual Activity: Not on file    Outpatient Encounter Prescriptions as of 06/20/2015  Medication Sig  . atorvastatin (LIPITOR) 10 MG tablet Take 1 tablet (10  mg total) by mouth daily at 6 PM.  . carvedilol (COREG) 25 MG tablet TAKE ONE TABLET BY MOUTH TWICE DAILY  . furosemide (LASIX) 20 MG tablet TAKE ONE TABLET BY MOUTH ONCE DAILY  . KLOR-CON M10 10 MEQ tablet TAKE ONE TABLET BY MOUTH ONCE DAILY  . lisinopril (PRINIVIL,ZESTRIL) 5 MG tablet TAKE ONE TABLET BY MOUTH TWICE DAILY  . spironolactone (ALDACTONE) 25 MG tablet TAKE ONE-HALF TABLET BY MOUTH ONCE DAILY   No facility-administered encounter medications on file as of 06/20/2015.    Activities of Daily Living In your present state of health, do you have any difficulty performing the following activities: 06/20/2015  Hearing? N  Vision? N  Difficulty concentrating or making decisions? N  Walking or climbing stairs? N  Dressing or bathing? N  Doing errands, shopping? N  Preparing Food and eating ? N  Using the Toilet? N  In the past six months, have you accidently leaked urine? N  Do you have problems with loss of bowel control? N  Managing your Medications? N  Managing your Finances? N  Housekeeping or managing your Housekeeping? N    Patient Care Team: Alan Craze, NP as PCP - General (Internal Medicine) Alan Riffle, MD as Consulting Physician (Cardiology) Alan Alvine, MD as Consulting Physician (Urology)  Assessment:   Exercise Activities and Dietary recommendations Current Exercise Habits: Home exercise routine, Type of exercise: walking;strength training/weights, Time (Minutes): 60, Frequency (Times/Week): 7, Weekly Exercise (Minutes/Week): 420, Intensity: Mild   Diet:  Regular diet.  Lots of salad.  Drink (4) 16 oz of water. Coffee in the morning.    Goals    . Lose 20lbs by next year.        Fall Risk Fall Risk  06/20/2015 02/25/2014 02/25/2014 10/23/2012  Falls in the past year? No No No No   Depression Screen PHQ 2/9 Scores 06/20/2015 02/25/2014 02/25/2014 10/23/2012  PHQ - 2 Score 0 0 0 0    Cognitive Testing MMSE - Mini Mental State Exam 06/20/2015    Orientation to time 5  Orientation to Place 5  Registration 3  Attention/ Calculation 5  Recall 3  Language- name 2 objects 2  Language- repeat 1  Language- follow 3 step command 3  Language- read & follow direction 1  Write a sentence 1  Copy design 1  Total score 30    Immunization History  Administered Date(s) Administered  . Influenza Split 02/23/2011, 01/13/2012  . Influenza Whole 01/19/2008, 01/20/2009, 12/18/2009  . Influenza,inj,Quad PF,36+ Mos 01/03/2013, 01/21/2014, 02/05/2015  . Pneumococcal Conjugate-13 01/21/2014, 02/05/2015  . Pneumococcal Polysaccharide-23 01/19/2008  . Td 01/29/2008  . Zoster 08/24/2011   Screening Tests Health Maintenance  Topic Date Due  . Hepatitis C Screening  1946-01-18  . INFLUENZA VACCINE  11/11/2015  . PNA vac Low Risk Adult (2 of 2 - PPSV23) 02/05/2016  . COLONOSCOPY  05/21/2017  . TETANUS/TDAP  01/28/2018  . ZOSTAVAX  Completed      Plan:  Go to lab for Hep C Screening.  We will contact you with results.    Continue doing brain stimulating activities (puzzles, reading, adult coloring books, staying active) to keep memory sharp.   Continue to eat heart healthy diet (full of fruits, vegetables, whole grains, lean protein, water--limit salt, fat, and sugar intake) and increase physical activity as tolerated.  Follow up with Alan Sabina, NP as scheduled.     During the course of the visit the patient was educated and counseled about the following appropriate screening and preventive services:   Vaccines to include Pneumoccal, Influenza, Hepatitis B, Td, Zostavax, HCV  Electrocardiogram  Cardiovascular Disease  Colorectal cancer screening  Diabetes screening  Prostate Cancer Screening  Glaucoma screening  Nutrition counseling   Smoking cessation counseling  Patient Instructions (the written plan) was given to the patient.    Alan Fantasia, RN  06/20/2015

## 2015-06-20 NOTE — Progress Notes (Signed)
Review note

## 2015-06-20 NOTE — Patient Instructions (Addendum)
Go to lab for Hep C Screening.  We will contact you with results.    Continue doing brain stimulating activities (puzzles, reading, adult coloring books, staying active) to keep memory sharp.   Continue to eat heart healthy diet (full of fruits, vegetables, whole grains, lean protein, water--limit salt, fat, and sugar intake) and increase physical activity as tolerated.  Follow up with [name], NP as scheduled.    Fat and Cholesterol Restricted Diet High levels of fat and cholesterol in your blood may lead to various health problems, such as diseases of the heart, blood vessels, gallbladder, liver, and pancreas. Fats are concentrated sources of energy that come in various forms. Certain types of fat, including saturated fat, may be harmful in excess. Cholesterol is a substance needed by your body in small amounts. Your body makes all the cholesterol it needs. Excess cholesterol comes from the food you eat. When you have high levels of cholesterol and saturated fat in your blood, health problems can develop because the excess fat and cholesterol will gather along the walls of your blood vessels, causing them to narrow. Choosing the right foods will help you control your intake of fat and cholesterol. This will help keep the levels of these substances in your blood within normal limits and reduce your risk of disease. WHAT IS MY PLAN? Your health care provider recommends that you:  Get no more than __________ % of the total calories in your daily diet from fat.  Limit your intake of saturated fat to less than ______% of your total calories each day.  Limit the amount of cholesterol in your diet to less than _________mg per day. WHAT TYPES OF FAT SHOULD I CHOOSE?  Choose healthy fats more often. Choose monounsaturated and polyunsaturated fats, such as olive and canola oil, flaxseeds, walnuts, almonds, and seeds.  Eat more omega-3 fats. Good choices include salmon, mackerel,  sardines, tuna, flaxseed oil, and ground flaxseeds. Aim to eat fish at least two times a week.  Limit saturated fats. Saturated fats are primarily found in animal products, such as meats, butter, and cream. Plant sources of saturated fats include palm oil, palm kernel oil, and coconut oil.  Avoid foods with partially hydrogenated oils in them. These contain trans fats. Examples of foods that contain trans fats are stick margarine, some tub margarines, cookies, crackers, and other baked goods. WHAT GENERAL GUIDELINES DO I NEED TO FOLLOW? These guidelines for healthy eating will help you control your intake of fat and cholesterol:  Check food labels carefully to identify foods with trans fats or high amounts of saturated fat.  Fill one half of your plate with vegetables and green salads.  Fill one fourth of your plate with whole grains. Look for the word "whole" as the first word in the ingredient list.  Fill one fourth of your plate with lean protein foods.  Limit fruit to two servings a day. Choose fruit instead of juice.  Eat more foods that contain soluble fiber. Examples of foods that contain this type of fiber are apples, broccoli, carrots, beans, peas, and barley. Aim to get 20-30 g of fiber per day.  Eat more home-cooked food and less restaurant, buffet, and fast food.  Limit or avoid alcohol.  Limit foods high in starch and sugar.  Limit fried foods.  Cook foods using methods other than frying. Baking, boiling, grilling, and broiling are all great options.  Lose weight if you are overweight. Losing just 5-10% of your initial body  weight can help your overall health and prevent diseases such as diabetes and heart disease. WHAT FOODS CAN I EAT? Grains Whole grains, such as whole wheat or whole grain breads, crackers, cereals, and pasta. Unsweetened oatmeal, bulgur, barley, quinoa, or brown rice. Corn or whole wheat flour tortillas. Vegetables Fresh or frozen vegetables (raw,  steamed, roasted, or grilled). Green salads. Fruits All fresh, canned (in natural juice), or frozen fruits. Meat and Other Protein Products Ground beef (85% or leaner), grass-fed beef, or beef trimmed of fat. Skinless chicken or [place]. Ground chicken or [place]. Pork trimmed of fat. All fish and seafood. Eggs. Dried beans, peas, or lentils. Unsalted nuts or seeds. Unsalted canned or dry beans. Dairy Low-fat dairy products, such as skim or 1% milk, 2% or reduced-fat cheeses, low-fat ricotta or cottage cheese, or plain low-fat yogurt. Fats and Oils Tub margarines without trans fats. Light or reduced-fat mayonnaise and salad dressings. Avocado. Olive, canola, sesame, or safflower oils. Natural peanut or almond butter (choose ones without added sugar and oil). The items listed above may not be a complete list of recommended foods or beverages. Contact your dietitian for more options. WHAT FOODS ARE NOT RECOMMENDED? Grains White bread. White pasta. White rice. Cornbread. Bagels, pastries, and croissants. Crackers that contain trans fat. Vegetables White potatoes. Corn. Creamed or fried vegetables. Vegetables in a cheese sauce. Fruits Dried fruits. Canned fruit in light or heavy syrup. Fruit juice. Meat and Other Protein Products Fatty cuts of meat. Ribs, chicken wings, bacon, sausage, bologna, salami, chitterlings, fatback, hot dogs, bratwurst, and packaged luncheon meats. Liver and organ meats. Dairy Whole or 2% milk, cream, half-and-half, and cream cheese. Whole milk cheeses. Whole-fat or sweetened yogurt. Full-fat cheeses. Nondairy creamers and whipped toppings. Processed cheese, cheese spreads, or cheese curds. Sweets and Desserts Corn syrup, sugars, honey, and molasses. Candy. Jam and jelly. Syrup. Sweetened cereals. Cookies, pies, cakes, donuts, muffins, and ice cream. Fats and Oils Butter, stick margarine, lard, shortening, ghee, or bacon fat. Coconut, palm kernel, or palm  oils. Beverages Alcohol. Sweetened drinks (such as sodas, lemonade, and fruit drinks or punches). The items listed above may not be a complete list of foods and beverages to avoid. Contact your dietitian for more information.   This information is not intended to replace advice given to you by your health care provider. Make sure you discuss any questions you have with your health care provider.   Document Released: 03/29/2005 Document Revised: 04/19/2014 Document Reviewed: 06/27/2013 Elsevier Interactive Patient Education 2016 [employer].  Health Maintenance, Male A healthy lifestyle and preventative care can promote health and wellness.  Maintain regular health, dental, and eye exams.  Eat a healthy diet. Foods like vegetables, fruits, whole grains, low-fat dairy products, and lean protein foods contain the nutrients you need and are low in calories. Decrease your intake of foods high in solid fats, added sugars, and salt. Get information about a proper diet from your health care provider, if necessary.  Regular physical exercise is one of the most important things you can do for your health. Most adults should get at least 150 minutes of moderate-intensity exercise (any activity that increases your heart rate and causes you to sweat) each week. In addition, most adults need muscle-strengthening exercises on 2 or more days a week.   Maintain a healthy weight. The body mass index (BMI) is a screening tool to identify possible weight problems. It provides an estimate of body fat based on height and weight. Your health care  provider can find your BMI and can help you achieve or maintain a healthy weight. For males 20 years and older:  A BMI below 18.5 is considered underweight.  A BMI of 18.5 to 24.9 is normal.  A BMI of 25 to 29.9 is considered overweight.  A BMI of 30 and above is considered obese.  Maintain normal blood lipids and cholesterol by exercising and minimizing your  intake of saturated fat. Eat a balanced diet with plenty of fruits and vegetables. Blood tests for lipids and cholesterol should begin at age 50 and be repeated every 5 years. If your lipid or cholesterol levels are high, you are over age 81, or you are at high risk for heart disease, you may need your cholesterol levels checked more frequently.Ongoing high lipid and cholesterol levels should be treated with medicines if diet and exercise are not working.  If you smoke, find out from your health care provider how to quit. If you do not use tobacco, do not start.  Lung cancer screening is recommended for adults aged 55-80 years who are at high risk for developing lung cancer because of a history of smoking. A yearly low-dose CT scan of the lungs is recommended for people who have at least a 30-pack-year history of smoking and are current smokers or have quit within the past 15 years. A pack year of smoking is smoking an average of 1 pack of cigarettes a day for 1 year (for example, a 30-pack-year history of smoking could mean smoking 1 pack a day for 30 years or 2 packs a day for 15 years). Yearly screening should continue until the smoker has stopped smoking for at least 15 years. Yearly screening should be stopped for people who develop a health problem that would prevent them from having lung cancer treatment.  If you choose to drink alcohol, do not have more than 2 drinks per day. One drink is considered to be 12 oz (360 mL) of beer, 5 oz (150 mL) of wine, or 1.5 oz (45 mL) of liquor.  Avoid the use of street drugs. Do not share needles with anyone. Ask for help if you need support or instructions about stopping the use of drugs.  High blood pressure causes heart disease and increases the risk of stroke. High blood pressure is more likely to develop in:  People who have blood pressure in the end of the normal range (100-139/85-89 mm Hg).  People who are overweight or obese.  People who are  African American.  If you are 66-56 years of age, have your blood pressure checked every 3-5 years. If you are 43 years of age or older, have your blood pressure checked every year. You should have your blood pressure measured twice--once when you are at a hospital or clinic, and once when you are not at a hospital or clinic. Record the average of the two measurements. To check your blood pressure when you are not at a hospital or clinic, you can use:  An automated blood pressure machine at a pharmacy.  A home blood pressure monitor.  If you are 17-56 years old, ask your health care provider if you should take aspirin to prevent heart disease.  Diabetes screening involves taking a blood sample to check your fasting blood sugar level. This should be done once every 3 years after age 37 if you are at a normal weight and without risk factors for diabetes. Testing should be considered at a younger age or  be carried out more frequently if you are overweight and have at least 1 risk factor for diabetes.  Colorectal cancer can be detected and often prevented. Most routine colorectal cancer screening begins at the age of 87 and continues through age 36. However, your health care provider may recommend screening at an earlier age if you have risk factors for colon cancer. On a yearly basis, your health care provider may provide home test kits to check for hidden blood in the stool. A small camera at the end of a tube may be used to directly examine the colon (sigmoidoscopy or colonoscopy) to detect the earliest forms of colorectal cancer. Talk to your health care provider about this at age 53 when routine screening begins. A direct exam of the colon should be repeated every 5-10 years through age 89, unless early forms of precancerous polyps or small growths are found.  People who are at an increased risk for hepatitis B should be screened for this virus. You are considered at high risk for hepatitis B  if:  You were born in a country where hepatitis B occurs often. Talk with your health care provider about which countries are considered high risk.  Your parents were born in a high-risk country and you have not received a shot to protect against hepatitis B (hepatitis B vaccine).  You have HIV or AIDS.  You use needles to inject street drugs.  You live with, or have sex with, someone who has hepatitis B.  You are a man who has sex with other men (MSM).  You get hemodialysis treatment.  You take certain medicines for conditions like cancer, organ transplantation, and autoimmune conditions.  Hepatitis C blood testing is recommended for all people born from [birth year] through 1964 and any individual with known risk factors for hepatitis C.  Healthy men should no longer receive prostate-specific antigen (PSA) blood tests as part of routine cancer screening. Talk to your health care provider about prostate cancer screening.  Testicular cancer screening is not recommended for adolescents or adult males who have no symptoms. Screening includes self-exam, a health care provider exam, and other screening tests. Consult with your health care provider about any symptoms you have or any concerns you have about testicular cancer.  Practice safe sex. Use condoms and avoid high-risk sexual practices to reduce the spread of sexually transmitted infections (STIs).  You should be screened for STIs, including gonorrhea and chlamydia if:  You are sexually active and are younger than 24 years.  You are older than 24 years, and your health care provider tells you that you are at risk for this type of infection.  Your sexual activity has changed since you were last screened, and you are at an increased risk for chlamydia or gonorrhea. Ask your health care provider if you are at risk.  If you are at risk of being infected with HIV, it is recommended that you take a prescription medicine daily to prevent HIV  infection. This is called pre-exposure prophylaxis (PrEP). You are considered at risk if:  You are a man who has sex with other men (MSM).  You are a heterosexual man who is sexually active with multiple partners.  You take drugs by injection.  You are sexually active with a partner who has HIV.  Talk with your health care provider about whether you are at high risk of being infected with HIV. If you choose to begin PrEP, you should first be tested for HIV. You  should then be tested every 3 months for as long as you are taking PrEP.  Use sunscreen. Apply sunscreen liberally and repeatedly throughout the day. You should seek shade when your shadow is shorter than you. Protect yourself by wearing long sleeves, pants, a wide-brimmed hat, and sunglasses year round whenever you are outdoors.  Tell your health care provider of new moles or changes in moles, especially if there is a change in shape or color. Also, tell your health care provider if a mole is larger than the size of a pencil eraser.  A one-time screening for abdominal aortic aneurysm (AAA) and surgical repair of large AAAs by ultrasound is recommended for men aged 65-75 years who are current or former smokers.  Stay current with your vaccines (immunizations).   This information is not intended to replace advice given to you by your health care provider. Make sure you discuss any questions you have with your health care provider.   Document Released: 09/25/2007 Document Revised: 04/19/2014 Document Reviewed: 08/24/2010 Elsevier Interactive Patient Education [employer]

## 2015-06-21 LAB — HEPATITIS C ANTIBODY: HCV Ab: NEGATIVE

## 2015-07-18 ENCOUNTER — Ambulatory Visit: Payer: Medicare Other | Admitting: Family

## 2015-07-22 ENCOUNTER — Other Ambulatory Visit: Payer: Self-pay | Admitting: Family

## 2015-07-30 ENCOUNTER — Ambulatory Visit (INDEPENDENT_AMBULATORY_CARE_PROVIDER_SITE_OTHER): Payer: Medicare Other | Admitting: Family

## 2015-07-30 ENCOUNTER — Encounter: Payer: Self-pay | Admitting: Family

## 2015-07-30 VITALS — BP 134/50 | HR 66 | Temp 98.2°F | Resp 16 | Ht 70.0 in | Wt 219.8 lb

## 2015-07-30 DIAGNOSIS — I10 Essential (primary) hypertension: Secondary | ICD-10-CM | POA: Diagnosis not present

## 2015-07-30 DIAGNOSIS — E785 Hyperlipidemia, unspecified: Secondary | ICD-10-CM

## 2015-07-30 DIAGNOSIS — R739 Hyperglycemia, unspecified: Secondary | ICD-10-CM | POA: Diagnosis not present

## 2015-07-30 LAB — LIPID PANEL
CHOL/HDL RATIO: 2
Cholesterol: 142 mg/dL (ref 0–200)
HDL: 57.7 mg/dL (ref >39.00)
LDL Cholesterol: 61 mg/dL (ref 0–99)
NonHDL: 84.31
TRIGLYCERIDES: 115 mg/dL (ref 0.0–149.0)
VLDL: 23 mg/dL (ref 0.0–40.0)

## 2015-07-30 LAB — BASIC METABOLIC PANEL
BUN: 15 mg/dL (ref 6–23)
CALCIUM: 9.7 mg/dL (ref 8.4–10.5)
CHLORIDE: 104 meq/L (ref 96–112)
CO2: 30 meq/L (ref 19–32)
Creatinine, Ser: 1.06 mg/dL (ref 0.40–1.50)
GFR: 89.21 mL/min (ref >60.00)
Glucose, Bld: 100 mg/dL — ABNORMAL HIGH (ref 70–99)
POTASSIUM: 4.3 meq/L (ref 3.5–5.1)
SODIUM: 139 meq/L (ref 135–145)

## 2015-07-30 LAB — HEMOGLOBIN A1C: Hgb A1c MFr Bld: 5.8 % (ref 4.6–6.5)

## 2015-07-30 MED ORDER — LISINOPRIL 5 MG PO TABS: 5.0000 mg | ORAL_TABLET | Freq: Two times a day (BID) | ORAL | Status: DC

## 2015-07-30 MED ORDER — POTASSIUM CHLORIDE CRYS ER 10 MEQ PO TBCR: 10.0000 meq | EXTENDED_RELEASE_TABLET | Freq: Every day | ORAL | Status: DC

## 2015-07-30 MED ORDER — FUROSEMIDE 20 MG PO TABS: 20.0000 mg | ORAL_TABLET | Freq: Every day | ORAL | Status: DC

## 2015-07-30 MED ORDER — CARVEDILOL 25 MG PO TABS: 25.0000 mg | ORAL_TABLET | Freq: Two times a day (BID) | ORAL | Status: DC

## 2015-07-30 NOTE — Progress Notes (Signed)
Pre visit review using our clinic review tool, if applicable. No additional management support is needed unless otherwise documented below in the visit note. 

## 2015-07-30 NOTE — Patient Instructions (Signed)
Please complete lab work prior to leaving.   

## 2015-07-30 NOTE — Assessment & Plan Note (Signed)
Obtain lipid panel, continue statin and low cholesterol diet.

## 2015-07-30 NOTE — Assessment & Plan Note (Signed)
He has gained some weight because of working more and walking less.  Obtain follow up A1C.

## 2015-07-30 NOTE — Assessment & Plan Note (Signed)
BP is stable on current meds. Obtain follow up bmet.  

## 2015-07-30 NOTE — Progress Notes (Signed)
Subjective:    Patient ID: [name], male    DOB: January 31, 1946, 70 y.o.   MRN: [id number]  HPI  [name] is a 70 yr old male who presents today for follow up.  1) HTN- currently maintained on carvedilol, lasix, prinivil, aldactone, K+.    BP Readings from Last 3 Encounters:  07/30/15 134/50  06/20/15 108/60  01/03/15 128/76   2) Hyperglycemia-  Lab Results  Component Value Date   HGBA1C 5.6 11/06/2014   3) OSA-Maintained on CPAP. Reports good compliance.  Feels great.    4) hyperlipidemia- maintained on lipitor.   Lab Results  Component Value Date   CHOL 136 08/26/2014   HDL 59.00 08/26/2014   LDLCALC 60 08/26/2014   TRIG 85.0 08/26/2014   CHOLHDL 2 08/26/2014    Review of Systems  Respiratory: Negative for shortness of breath.   Cardiovascular: Negative for chest pain and leg swelling.       Past Medical History  Diagnosis Date  . Hypertension   . Routine general medical examination at a health care facility   . History of ETOH abuse   . Non-ischemic cardiomyopathy (HCC)     Presumed secondary to Etoh and HTN  (Prev EF 20-25% -- improved to 50%)  . Anomalous right coronary artery   . OSA (obstructive sleep apnea) 10/17/2013    Severe per home sleep study performed on 09/26/13.      Social History   Social History  . Marital Status: Married    Spouse Name: [name]  . Number of Children: 3  . Years of Education: N/A   Occupational History  . Retired Radiographer, therapeutic    Social History Main Topics  . Smoking status: Former Smoker -- 20 years    Quit date: 04/12/2002  . Smokeless tobacco: Never Used  . Alcohol Use: 0.0 oz/week    0 Standard drinks or equivalent per week     Comment: 1 glass of wine each night.    . Drug Use: No     Comment: history of marijuana use  . Sexual Activity: Not on file   Other Topics Concern  . Not on file   Social History Narrative   2 sons and daughter   1 son in [name]. Living with wife 20yrs [name]).  Use to live near  [place].    No past surgical history on file.  Family History  Problem Relation Age of Onset  . Alcohol abuse Mother   . Heart disease Mother   . Alcohol abuse Father   . Diabetes Neg Hx   . Heart disease Maternal Grandmother   . Heart failure Father   . Stroke Maternal Grandmother     No Known Allergies  Current Outpatient Prescriptions on File Prior to Visit  Medication Sig Dispense Refill  . atorvastatin (LIPITOR) 10 MG tablet Take 1 tablet (10 mg total) by mouth daily at 6 PM. 90 tablet 3  . spironolactone (ALDACTONE) 25 MG tablet TAKE ONE-HALF TABLET BY MOUTH ONCE DAILY 45 tablet 1   No current facility-administered medications on file prior to visit.    BP 134/50 mmHg  Pulse 66  Temp(Src) 98.2 F (36.8 C) (Oral)  Resp 16  Ht 5\' 10"  (1.778 m)  Wt 219 lb 12.8 oz (99.701 kg)  BMI 31.54 kg/m2  SpO2 99%    Objective:   Physical Exam  Constitutional: He is oriented to person, place, and time. He appears well-developed and well-nourished. No distress.  HENT:  Head: Normocephalic and atraumatic.  Cardiovascular: Normal rate and regular rhythm.   No murmur heard. Pulmonary/Chest: Effort normal and breath sounds normal. No respiratory distress. He has no wheezes. He has no rales.  Musculoskeletal: He exhibits no edema.  Neurological: He is alert and oriented to person, place, and time.  Skin: Skin is warm and dry.  Psychiatric: He has a normal mood and affect. His behavior is normal. Thought content normal.          Assessment & Plan:

## 2015-08-20 ENCOUNTER — Other Ambulatory Visit: Payer: Self-pay | Admitting: Internal Medicine

## 2015-11-05 ENCOUNTER — Other Ambulatory Visit: Payer: Self-pay | Admitting: Internal Medicine

## 2016-02-02 ENCOUNTER — Encounter: Payer: Self-pay | Admitting: Family

## 2016-02-02 ENCOUNTER — Ambulatory Visit (INDEPENDENT_AMBULATORY_CARE_PROVIDER_SITE_OTHER): Payer: Medicare Other | Admitting: Family

## 2016-02-02 DIAGNOSIS — G4700 Insomnia, unspecified: Secondary | ICD-10-CM | POA: Diagnosis not present

## 2016-02-02 DIAGNOSIS — Z23 Encounter for immunization: Secondary | ICD-10-CM | POA: Diagnosis not present

## 2016-02-02 DIAGNOSIS — G4733 Obstructive sleep apnea (adult) (pediatric): Secondary | ICD-10-CM

## 2016-02-02 NOTE — Progress Notes (Signed)
Pre visit review using our clinic review tool, if applicable. No additional management support is needed unless otherwise documented below in the visit note. 

## 2016-02-02 NOTE — Assessment & Plan Note (Signed)
Reinforced importance of CPAP use.

## 2016-02-02 NOTE — Patient Instructions (Addendum)
Work on avoiding naps during the day. No caffeine after breakfast.   Try to exercise every day.

## 2016-02-02 NOTE — Progress Notes (Signed)
Subjective:    Patient ID: [name], male    DOB: 04/24/1945, 70 y.o.   MRN: [id number]  HPI  [name] is a 70 yr old male who presents today to discuss nasal congestion.  Reports that had some congestion last week which has resolved. He thought about cancelling this appointment, but he wanted a to get his flu shot so he kept his appointment.   Was using benadryl, but he thought it contributed to his nasal congestion so he stopped. Now he is not sleeping as well. Reports that he walks regularly.  He reports that he sleeps well when he walks.  He does not nap during the day.  Though wife notes that he doses off during the day.  Reports intermittent compliance with cpap.   Review of Systems    see HPI  Past Medical History:  Diagnosis Date  . Anomalous right coronary artery   . History of ETOH abuse   . Hypertension   . Non-ischemic cardiomyopathy (HCC)    Presumed secondary to Etoh and HTN  (Prev EF 20-25% -- improved to 50%)  . OSA (obstructive sleep apnea) 10/17/2013   Severe per home sleep study performed on 09/26/13.   Marland Kitchen. Routine general medical examination at a health care facility      Social History   Social History  . Marital status: Married    Spouse name: Nelva Bushorma  . Number of children: 3  . Years of education: N/A   Occupational History  . Retired Radiographer, [employer]    Social History Main Topics  . Smoking status: Former Smoker    Years: 20.00    Quit date: 04/12/2002  . Smokeless tobacco: Never Used  . Alcohol use 0.0 oz/week     Comment: 1 glass of wine each night.    . Drug use: No     Comment: history of marijuana use  . Sexual activity: Not on file   Other Topics Concern  . Not on file   Social History Narrative   2 sons and daughter   1 son in [name]. Living with wife 892yrs [name]([place]).  Use to live near [place].    No past surgical history on file.  Family History  Problem Relation Age of Onset  . Alcohol abuse Mother   . Heart disease Mother   . Alcohol  abuse Father   . Heart failure Father   . Heart disease Maternal Grandmother   . Stroke Maternal Grandmother   . Diabetes Neg Hx     No Known Allergies  Current Outpatient Prescriptions on File Prior to Visit  Medication Sig Dispense Refill  . atorvastatin (LIPITOR) 10 MG tablet TAKE ONE TABLET BY MOUTH ONCE DAILY AT  6  PM 90 tablet 0  . carvedilol (COREG) 25 MG tablet Take 1 tablet (25 mg total) by mouth 2 (two) times daily. 180 tablet 1  . furosemide (LASIX) 20 MG tablet Take 1 tablet (20 mg total) by mouth daily. 90 tablet 1  . lisinopril (PRINIVIL,ZESTRIL) 5 MG tablet Take 1 tablet (5 mg total) by mouth 2 (two) times daily. 180 tablet 1  . potassium chloride (KLOR-CON M10) 10 MEQ tablet Take 1 tablet (10 mEq total) by mouth daily. 90 tablet 1  . spironolactone (ALDACTONE) 25 MG tablet TAKE ONE-HALF TABLET BY MOUTH ONCE DAILY 45 tablet 0   No current facility-administered medications on file prior to visit.     BP 128/82 (BP Location: Right Arm, Patient Position: Sitting,  Cuff Size: Normal)   Pulse 68   Temp 98.2 F (36.8 C) (Oral)   Resp 16   Ht 5\' 10"  (1.778 m)   Wt 221 lb 3.2 oz (100.3 kg)   SpO2 98% Comment: room air  BMI 31.74 kg/m    Objective:   Physical Exam  Constitutional: He is oriented to person, place, and time. He appears well-developed and well-nourished. No distress.  HENT:  Head: Normocephalic and atraumatic.  Cardiovascular: Normal rate and regular rhythm.   No murmur heard. Pulmonary/Chest: Effort normal and breath sounds normal. No respiratory distress. He has no wheezes. He has no rales.  Musculoskeletal: He exhibits no edema.  Neurological: He is alert and oriented to person, place, and time.  Skin: Skin is warm and dry.  Psychiatric: He has a normal mood and affect. His behavior is normal. Thought content normal.          Assessment & Plan:

## 2016-02-02 NOTE — Assessment & Plan Note (Signed)
He wishes to avoid medications. We discussed good sleep hygiene:  Work on avoiding naps during the day. No caffeine after breakfast.  Try to exercise every day.

## 2016-02-06 ENCOUNTER — Other Ambulatory Visit: Payer: Self-pay | Admitting: Family

## 2016-02-11 ENCOUNTER — Other Ambulatory Visit: Payer: Self-pay | Admitting: *Deleted

## 2016-02-11 MED ORDER — SPIRONOLACTONE 25 MG PO TABS: 12.5000 mg | ORAL_TABLET | Freq: Every day | ORAL | 0 refills | Status: DC

## 2016-02-11 MED ORDER — ATORVASTATIN CALCIUM 10 MG PO TABS: ORAL_TABLET | ORAL | 0 refills | Status: DC

## 2016-02-11 NOTE — Telephone Encounter (Signed)
atorvastatin (LIPITOR) 10 MG tablet  Medication  Date: 11/06/2015 Department: [facility] [address] Office Ordering/Authorizing: Pricilla Riffle, MD  Order Providers   Prescribing Provider Encounter Provider  Pricilla Riffle, MD Pricilla Riffle, MD  Medication Detail    Disp Refills Start End   atorvastatin (LIPITOR) 10 MG tablet 90 tablet 0 11/06/2015    Sig: TAKE ONE TABLET BY MOUTH ONCE DAILY AT 6 PM   Notes to Pharmacy: Please call our office to schedule an yearly appointment for future refills. [phone]. Thank you 1st attempt   E-Prescribing Status: Receipt confirmed by pharmacy (11/06/2015 8:40 AM EDT)   Pharmacy   [id number] - [place], [place] - [address]

## 2016-04-03 ENCOUNTER — Other Ambulatory Visit: Payer: Self-pay | Admitting: Internal Medicine

## 2016-04-07 ENCOUNTER — Other Ambulatory Visit: Payer: Self-pay | Admitting: *Deleted

## 2016-04-07 MED ORDER — ATORVASTATIN CALCIUM 10 MG PO TABS: ORAL_TABLET | ORAL | 0 refills | Status: DC

## 2016-04-07 MED ORDER — SPIRONOLACTONE 25 MG PO TABS: 12.5000 mg | ORAL_TABLET | Freq: Every day | ORAL | 0 refills | Status: DC

## 2016-04-25 ENCOUNTER — Emergency Department (HOSPITAL_BASED_OUTPATIENT_CLINIC_OR_DEPARTMENT_OTHER)
Admission: EM | Admit: 2016-04-25 | Discharge: 2016-04-25 | Disposition: A | Discharge status: Home / Self Care | Payer: Medicare Other | Attending: Emergency Medicine | Admitting: Emergency Medicine

## 2016-04-25 ENCOUNTER — Telehealth: Payer: Self-pay | Admitting: Family

## 2016-04-25 ENCOUNTER — Encounter (HOSPITAL_BASED_OUTPATIENT_CLINIC_OR_DEPARTMENT_OTHER): Payer: Self-pay | Admitting: *Deleted

## 2016-04-25 DIAGNOSIS — I10 Essential (primary) hypertension: Secondary | ICD-10-CM | POA: Insufficient documentation

## 2016-04-25 DIAGNOSIS — R220 Localized swelling, mass and lump, head: Secondary | ICD-10-CM | POA: Diagnosis present

## 2016-04-25 DIAGNOSIS — T783XXA Angioneurotic edema, initial encounter: Secondary | ICD-10-CM | POA: Insufficient documentation

## 2016-04-25 DIAGNOSIS — Z87891 Personal history of nicotine dependence: Secondary | ICD-10-CM | POA: Diagnosis not present

## 2016-04-25 DIAGNOSIS — Z79899 Other long term (current) drug therapy: Secondary | ICD-10-CM | POA: Diagnosis not present

## 2016-04-25 MED ORDER — PREDNISONE 50 MG PO TABS: ORAL_TABLET | ORAL | 0 refills | Status: DC

## 2016-04-25 MED ORDER — DIPHENHYDRAMINE HCL 50 MG/ML IJ SOLN: 25.0000 mg | Freq: Once | INTRAMUSCULAR | Status: DC

## 2016-04-25 MED ORDER — DIPHENHYDRAMINE HCL 25 MG PO CAPS
50.0000 mg | ORAL_CAPSULE | Freq: Once | ORAL | Status: AC
  Administered 2016-04-25: 50 mg via ORAL
  Filled 2016-04-25: qty 2

## 2016-04-25 MED ORDER — PREDNISONE 50 MG PO TABS
60.0000 mg | ORAL_TABLET | Freq: Once | ORAL | Status: AC
  Administered 2016-04-25: 60 mg via ORAL
  Filled 2016-04-25: qty 1

## 2016-04-25 MED ORDER — METHYLPREDNISOLONE SODIUM SUCC 125 MG IJ SOLR: 125.0000 mg | Freq: Once | INTRAMUSCULAR | Status: DC

## 2016-04-25 MED ORDER — FAMOTIDINE 20 MG PO TABS
20.0000 mg | ORAL_TABLET | Freq: Once | ORAL | Status: AC
  Administered 2016-04-25: 20 mg via ORAL
  Filled 2016-04-25: qty 1

## 2016-04-25 MED ORDER — FAMOTIDINE IN NACL 20-0.9 MG/50ML-% IV SOLN: 20.0000 mg | Freq: Once | INTRAVENOUS | Status: DC

## 2016-04-25 NOTE — Discharge Instructions (Signed)
Take prednisone 50 mg daily x 5 days.   Take benadryl 50 mg every 8 hrs for 2 days then as needed.   Stop taking lisinopril.   See your doctor next week and get repeat blood pressure check and you may need to start another medicine if your blood pressure if elevated   Return to ER if you have worse swelling, trouble breathing, unable to swallow, drooling.

## 2016-04-25 NOTE — ED Notes (Signed)
ED Provider at bedside. 

## 2016-04-25 NOTE — ED Triage Notes (Signed)
Pt c/o lower lip swelling x 1 day started last pm. No resp distress noted. Pt is on lisinopril

## 2016-04-25 NOTE — Telephone Encounter (Signed)
Please contact patient to arrange a 1 week ED follow up with me.

## 2016-04-25 NOTE — ED Provider Notes (Signed)
[facility]   CSN: [id number] Arrival date & time: 04/25/16  4098     History   Chief Complaint Chief Complaint  Patient presents with  . Oral Swelling    HPI [name] is a 71 y.o. male hx of HTN, cardiomyopathy, here with angioedema. Patient states that he started having lower lip swelling around 7 PM yesterday. He denies any trouble swallowing or trouble speaking. He felt that the swelling is gone ago down but woke up this morning and the swelling is still present. He states that the swelling is not getting worse and he does not have any trouble speaking or swallowing still. Denies any shortness of breath. He has been on lisinopril for years with no reactions. Denies any hives or itchiness.    The history is provided by the patient.    Past Medical History:  Diagnosis Date  . Anomalous right coronary artery   . History of ETOH abuse   . Hypertension   . Non-ischemic cardiomyopathy (HCC)    Presumed secondary to Etoh and HTN  (Prev EF 20-25% -- improved to 50%)  . OSA (obstructive sleep apnea) 10/17/2013   Severe per home sleep study performed on 09/26/13.   Marland Kitchen Routine general medical examination at a health care facility     Patient Active Problem List   Diagnosis Date Noted  . Insomnia 02/02/2016  . Hyperglycemia 04/24/2014  . HTN (hypertension) 04/24/2014  . OSA (obstructive sleep apnea) 10/17/2013  . Hyperlipidemia 08/27/2013  . Need for assessment for sleep apnea 05/01/2013  . Elevated liver function tests 02/24/2011  . General medical examination 02/23/2011  . Erectile dysfunction 09/01/2010  . ABNORMAL EKG 03/21/2008  . Essential hypertension 08/02/2007  . History of cardiovascular disorder 08/02/2007    History reviewed. No pertinent surgical history.     Home Medications    Prior to Admission medications   Medication Sig Start Date End Date Taking? Authorizing Provider  diphenhydrAMINE (BENADRYL) 25 MG tablet Take 50 mg  by mouth every 6 (six) hours as needed.   Yes Historical Provider, MD  lisinopril (PRINIVIL,ZESTRIL) 5 MG tablet TAKE ONE TABLET BY MOUTH TWICE DAILY 02/07/16  Yes Sandford Craze, NP  atorvastatin (LIPITOR) 10 MG tablet TAKE ONE TABLET BY MOUTH ONCE DAILY AT  6  PM 04/07/16   Pricilla Riffle, MD  carvedilol (COREG) 25 MG tablet TAKE ONE TABLET BY MOUTH TWICE DAILY 02/07/16   Sandford Craze, NP  furosemide (LASIX) 20 MG tablet TAKE ONE TABLET BY MOUTH ONCE DAILY 02/07/16   Sandford Craze, NP  KLOR-CON M10 10 MEQ tablet TAKE ONE TABLET BY MOUTH ONCE DAILY 02/07/16   Sandford Craze, NP  spironolactone (ALDACTONE) 25 MG tablet Take 0.5 tablets (12.5 mg total) by mouth daily. 04/07/16   Pricilla Riffle, MD    Family History Family History  Problem Relation Age of Onset  . Alcohol abuse Mother   . Heart disease Mother   . Alcohol abuse Father   . Heart failure Father   . Heart disease Maternal Grandmother   . Stroke Maternal Grandmother   . Diabetes Neg Hx     Social History Social History  Substance Use Topics  . Smoking status: Former Smoker    Years: 20.00    Quit date: 04/12/2002  . Smokeless tobacco: Never Used  . Alcohol use 0.0 oz/week     Comment: 1 glass of wine each night.       Allergies  Patient has no known allergies.   Review of Systems Review of Systems  HENT:       Lower lip swelling   All other systems reviewed and are negative.    Physical Exam Updated Vital Signs BP 143/92   Pulse 70   Temp 97.9 F (36.6 C) (Oral)   Resp 18   Ht 5\' 9"  (1.753 m)   Wt 210 lb (95.3 kg)   SpO2 98%   BMI 31.01 kg/m   Physical Exam  Constitutional: He is oriented to person, place, and time. He appears well-developed and well-nourished.  HENT:  Head: Normocephalic.  Mouth/Throat: Oropharynx is clear and moist.  Posterior pharynx not swollen. Lower lip swelling, floor of mouth is soft and nontender. No stridor   Eyes: EOM are normal. Pupils are equal,  round, and reactive to light.  Neck:  No stridor   Cardiovascular: Normal rate, regular rhythm and normal heart sounds.   Pulmonary/Chest: Effort normal and breath sounds normal. No respiratory distress. He has no wheezes.  Abdominal: Soft. Bowel sounds are normal. He exhibits no distension. There is no tenderness.  Musculoskeletal: Normal range of motion.  Neurological: He is alert and oriented to person, place, and time.  Skin: Skin is warm.  Psychiatric: He has a normal mood and affect.  Nursing note and vitals reviewed.    ED Treatments / Results  Labs (all labs ordered are listed, but only abnormal results are displayed) Labs Reviewed - No data to display  EKG  EKG Interpretation None       Radiology No results found.  Procedures Procedures (including critical care time)  Medications Ordered in ED Medications  predniSONE (DELTASONE) tablet 60 mg (not administered)  diphenhydrAMINE (BENADRYL) capsule 50 mg (not administered)  famotidine (PEPCID) tablet 20 mg (not administered)     Initial Impression / Assessment and Plan / ED Course  I have reviewed the triage vital signs and the nursing notes.  Pertinent labs & imaging results that were available during my care of the patient were reviewed by me and considered in my medical decision making (see chart for details).  Clinical Course    [name] is a 71 y.o. male here with lower lip angioedema likely from lisinopril. Symptoms for 12 hrs and stable for the last several hours. Has no trouble speaking, still protecting airway. Given that its been stable for several hours, I think it won't likely become worse. Will give prednisone, benadryl. Will give a course of prednisone. BP 143/92 in the ED, will dc lisinopril and have him follow up with PCP in a week to recheck BP and possibly start another BP med at that time. He is on coreg and lasix and will continue those    Final Clinical Impressions(s) / ED Diagnoses    Final diagnoses:  None    New Prescriptions New Prescriptions   No medications on file     Charlynne Pander, MD 04/25/16 [phone]

## 2016-04-26 NOTE — Telephone Encounter (Signed)
Pt has been scheduled.  °

## 2016-04-28 ENCOUNTER — Ambulatory Visit: Payer: Medicare Other | Admitting: Family

## 2016-05-05 ENCOUNTER — Encounter: Payer: Self-pay | Admitting: Family

## 2016-05-05 ENCOUNTER — Ambulatory Visit (INDEPENDENT_AMBULATORY_CARE_PROVIDER_SITE_OTHER): Payer: Medicare Other | Admitting: Family

## 2016-05-05 VITALS — BP 113/67 | HR 71 | Temp 98.1°F | Resp 16 | Ht 70.0 in | Wt 216.2 lb

## 2016-05-05 DIAGNOSIS — T783XXA Angioneurotic edema, initial encounter: Secondary | ICD-10-CM

## 2016-05-05 DIAGNOSIS — I10 Essential (primary) hypertension: Secondary | ICD-10-CM

## 2016-05-05 DIAGNOSIS — E785 Hyperlipidemia, unspecified: Secondary | ICD-10-CM

## 2016-05-05 LAB — LIPID PANEL
Cholesterol: 113 mg/dL (ref 0–200)
HDL: 48.6 mg/dL (ref >39.00)
LDL Cholesterol: 52 mg/dL (ref 0–99)
NONHDL: 63.94
Total CHOL/HDL Ratio: 2
Triglycerides: 60 mg/dL (ref 0.0–149.0)
VLDL: 12 mg/dL (ref 0.0–40.0)

## 2016-05-05 LAB — BASIC METABOLIC PANEL
BUN: 15 mg/dL (ref 6–23)
CALCIUM: 8.9 mg/dL (ref 8.4–10.5)
CO2: 30 mEq/L (ref 19–32)
Chloride: 105 mEq/L (ref 96–112)
Creatinine, Ser: 1.2 mg/dL (ref 0.40–1.50)
GFR: 77.14 mL/min (ref >60.00)
GLUCOSE: 121 mg/dL — AB (ref 70–99)
Potassium: 4.2 mEq/L (ref 3.5–5.1)
SODIUM: 140 meq/L (ref 135–145)

## 2016-05-05 NOTE — Assessment & Plan Note (Signed)
Tolerating statin, continue same. Obtain follow up lipid panel.  ?

## 2016-05-05 NOTE — Progress Notes (Signed)
Subjective:    Patient ID: [name], male    DOB: 16-Aug-1945, 71 y.o.   MRN: [id number]  HPI   [name] is a 71 yr old male who presents today for ED follow up. He presented to the ED on 04/26/15 angioedema- he was seen in the ED on 04/26/15.  ED record is reviewed.  He was found to have angioedema of the lower lip felt secondary to lisinopril.  His lisinopril was discontinued.  He is maintained on lasix, coreg and aldactone.  BP Readings from Last 3 Encounters:  05/05/16 113/67  04/25/16 143/92  02/02/16 128/82   He reports that his swelling went right down after he was started on prednisone. Denies SOB.   Hyperlipidemia- continues statin, denies myalgia.    Review of Systems See HPI  Past Medical History:  Diagnosis Date  . Anomalous right coronary artery   . History of ETOH abuse   . Hypertension   . Non-ischemic cardiomyopathy (HCC)    Presumed secondary to Etoh and HTN  (Prev EF 20-25% -- improved to 50%)  . OSA (obstructive sleep apnea) 10/17/2013   Severe per home sleep study performed on 09/26/13.   Marland Kitchen Routine general medical examination at a health care facility      Social History   Social History  . Marital status: Married    Spouse name: [name]  . Number of children: 3  . Years of education: N/A   Occupational History  . Retired Radiographer, therapeutic    Social History Main Topics  . Smoking status: Former Smoker    Years: 20.00    Quit date: 04/12/2002  . Smokeless tobacco: Never Used  . Alcohol use 0.0 oz/week     Comment: 1 glass of wine each night.    . Drug use: No     Comment: history of marijuana use  . Sexual activity: Not on file   Other Topics Concern  . Not on file   Social History Narrative   2 sons and daughter   1 son in [name]. Living with wife 72yrs [name]).  Use to live near [place].    No past surgical history on file.  Family History  Problem Relation Age of Onset  . Alcohol abuse Mother   . Heart disease Mother   . Alcohol abuse  Father   . Heart failure Father   . Heart disease Maternal Grandmother   . Stroke Maternal Grandmother   . Diabetes Neg Hx     Allergies  Allergen Reactions  . Lisinopril     ANGIOEDEMA    Current Outpatient Prescriptions on File Prior to Visit  Medication Sig Dispense Refill  . atorvastatin (LIPITOR) 10 MG tablet TAKE ONE TABLET BY MOUTH ONCE DAILY AT  6  PM 60 tablet 0  . carvedilol (COREG) 25 MG tablet TAKE ONE TABLET BY MOUTH TWICE DAILY 180 tablet 1  . diphenhydrAMINE (BENADRYL) 25 MG tablet Take 50 mg by mouth every 6 (six) hours as needed.    . furosemide (LASIX) 20 MG tablet TAKE ONE TABLET BY MOUTH ONCE DAILY 90 tablet 1  . KLOR-CON M10 10 MEQ tablet TAKE ONE TABLET BY MOUTH ONCE DAILY 90 tablet 1  . spironolactone (ALDACTONE) 25 MG tablet Take 0.5 tablets (12.5 mg total) by mouth daily. 30 tablet 0   No current facility-administered medications on file prior to visit.     BP 113/67 (BP Location: Right Arm, Cuff Size: Large)   Pulse 71  Temp 98.1 F (36.7 C) (Oral)   Resp 16   Ht 5\' 10"  (1.778 m)   Wt 216 lb 3.2 oz (98.1 kg)   BMI 31.02 kg/m       Objective:   Physical Exam  Constitutional: He is oriented to person, place, and time. He appears well-developed and well-nourished. No distress.  HENT:  Head: Normocephalic and atraumatic.  No tongue or lip swelling noted.   Cardiovascular: Normal rate and regular rhythm.   No murmur heard. Pulmonary/Chest: Effort normal and breath sounds normal. No respiratory distress. He has no wheezes. He has no rales.  Musculoskeletal: He exhibits no edema.  Neurological: He is alert and oriented to person, place, and time.  Skin: Skin is warm and dry.  Psychiatric: He has a normal mood and affect. His behavior is normal. Thought content normal.          Assessment & Plan:  Angioedema- resolved. Advise pt to remain off of lisinopril.

## 2016-05-05 NOTE — Patient Instructions (Signed)
Complete lab work prior to leaving.  

## 2016-05-05 NOTE — Assessment & Plan Note (Signed)
BP is stable off of lisinopril  Continue off of lisinopril.

## 2016-05-05 NOTE — Progress Notes (Signed)
Pre visit review using our clinic review tool, if applicable. No additional management support is needed unless otherwise documented below in the visit note. 

## 2016-05-07 ENCOUNTER — Ambulatory Visit: Payer: Medicare Other | Admitting: Family

## 2016-05-16 ENCOUNTER — Other Ambulatory Visit: Payer: Self-pay | Admitting: Internal Medicine

## 2016-05-18 NOTE — Progress Notes (Signed)
Cardiology Office Note   Date:  05/20/2016   ID:  [name], [name] 1945-08-24, MRN [id number]  PCP:  Lemont Fillers., NP  Cardiologist:   Dietrich Pates, MD   F/U of [facility]     History of Present Illness: [name] is a 71 y.o. male with a history of CHF  I saw him in 2016  CT scan showed mild CAD   Echo in Jan 2017  LVEF 30 to 35%  MIld AS   Seen in ER for angioedema of lip  Flet to be due to lisinopril  D/C's   Since seen he has felt good otherwise  No CP  Breathing is OK  No edema   Current Meds  Medication Sig  . atorvastatin (LIPITOR) 10 MG tablet TAKE ONE TABLET BY MOUTH ONCE DAILY AT  6  PM  . carvedilol (COREG) 25 MG tablet TAKE ONE TABLET BY MOUTH TWICE DAILY  . diphenhydrAMINE (BENADRYL) 25 MG tablet Take 50 mg by mouth every 6 (six) hours as needed.  . furosemide (LASIX) 20 MG tablet TAKE ONE TABLET BY MOUTH ONCE DAILY  . KLOR-CON M10 10 MEQ tablet TAKE ONE TABLET BY MOUTH ONCE DAILY  . spironolactone (ALDACTONE) 25 MG tablet Take 0.5 tablets (12.5 mg total) by mouth daily.     Allergies:   Lisinopril   Past Medical History:  Diagnosis Date  . Anomalous right coronary artery   . History of ETOH abuse   . Hypertension   . Non-ischemic cardiomyopathy (HCC)    Presumed secondary to Etoh and HTN  (Prev EF 20-25% -- improved to 50%)  . OSA (obstructive sleep apnea) 10/17/2013   Severe per home sleep study performed on 09/26/13.   Marland Kitchen Routine general medical examination at a health care facility     History reviewed. No pertinent surgical history.   Social History:  The patient  reports that he quit smoking about 14 years ago. He quit after 20.00 years of use. He has never used smokeless tobacco. He reports that he drinks alcohol. He reports that he does not use drugs.   Family History:  The patient's family history includes Alcohol abuse in his father and mother; Heart disease in his maternal grandmother and mother; Heart failure in his father; Stroke in his  maternal grandmother.    ROS:  Please see the history of present illness. All other systems are reviewed and  Negative to the above problem except as noted.    PHYSICAL EXAM: VS:  BP 132/76   Pulse 76   Ht 5\' 10"  (1.778 m)   Wt 221 lb 12.8 oz (100.6 kg)   BMI 31.82 kg/m   GEN: Well nourished, well developed, in no acute distress  HEENT: normal  Neck: no JVD, carotid bruits, or masses Cardiac: RRR; no murmurs, rubs, or gallops,no edema  Respiratory:  clear to auscultation bilaterally, normal work of breathing GI: soft, nontender, nondistended, + BS  No hepatomegaly  MS: no deformity Moving all extremities   Skin: warm and dry, no rash Neuro:  Strength and sensation are intact Psych: euthymic mood, full affect   EKG:  EKG is ordered today.  SR 76 bpm  IWMI  T wave inversion V5, V^, I, AVL (old)   Lipid Panel    Component Value Date/Time   CHOL 113 05/05/2016 0821   TRIG 60.0 05/05/2016 0821   HDL 48.60 05/05/2016 0821   CHOLHDL 2 05/05/2016 0821   VLDL 12.0 05/05/2016 4098  LDLCALC 52 05/05/2016 0821      Wt Readings from Last 3 Encounters:  05/20/16 221 lb 12.8 oz (100.6 kg)  05/05/16 216 lb 3.2 oz (98.1 kg)  04/25/16 210 lb (95.3 kg)      ASSESSMENT AND PLAN:  1  Chronic systolic CHF  Volume status is good  Cannot take ace I or ARB  Recomm Hydralazine 10 tid and Imdur  30  F/U in 6 wks    2  Lipids   LDL in APril 2017 was 61  Continue   3  HTN  Follow with change in meds    4  Sleep apnea    F/U in 6 wks    Current medicines are reviewed at length with the patient today.  The patient does not have concerns regarding medicines.  Signed, Dietrich Pates, MD  05/20/2016 11:50 AM    [facility] [address] [place], [place], [place]  [address] Phone: [phone]; Fax: [phone]

## 2016-05-18 NOTE — Telephone Encounter (Signed)
Approved    Disp Refills Start End  atorvastatin (LIPITOR) 10 MG tablet 60 tablet 0 04/07/2016   Sig:  TAKE ONE TABLET BY MOUTH ONCE DAILY AT 6 PM  Class:  Normal  DAW:  No  Authorizing Provider:  Pricilla Riffle, MD  Ordering User:  Awilda Bill, CMA  spironolactone (ALDACTONE) 25 MG tablet 30 tablet 0 04/07/2016   Sig - Route:  Take 0.5 tablets (12.5 mg total) by mouth daily. - Oral  Class:  Normal  DAW:  No  Authorizing Provider:  Pricilla Riffle, MD  Ordering User:  Awilda Bill, CMA  Visit Pharmacy   [facility] - [place], [place] - [address]

## 2016-05-20 ENCOUNTER — Encounter: Payer: Self-pay | Admitting: Internal Medicine

## 2016-05-20 ENCOUNTER — Ambulatory Visit (INDEPENDENT_AMBULATORY_CARE_PROVIDER_SITE_OTHER): Payer: Medicare Other | Admitting: Internal Medicine

## 2016-05-20 VITALS — BP 132/76 | HR 76 | Ht 70.0 in | Wt 221.8 lb

## 2016-05-20 DIAGNOSIS — I10 Essential (primary) hypertension: Secondary | ICD-10-CM | POA: Diagnosis not present

## 2016-05-20 MED ORDER — HYDRALAZINE HCL 10 MG PO TABS: 10.0000 mg | ORAL_TABLET | Freq: Three times a day (TID) | ORAL | 11 refills | Status: DC

## 2016-05-20 MED ORDER — ISOSORBIDE MONONITRATE ER 30 MG PO TB24: 30.0000 mg | ORAL_TABLET | Freq: Every day | ORAL | 11 refills | Status: DC

## 2016-05-20 NOTE — Patient Instructions (Signed)
Your physician has recommended you make the following change in your medication:  1.) start hydralazine 10 mg --one tablet 3 times a day                  (example: 8am, 2pm, 10pm)  2.) start isosorbide (IMDUR) 30 mg --one tablet once a day   Your physician recommends that you schedule a follow-up appointment in: 6 weeks with Dr. Tenny Craw.

## 2016-05-27 ENCOUNTER — Telehealth: Payer: Self-pay | Admitting: Internal Medicine

## 2016-05-27 NOTE — Telephone Encounter (Signed)
New message     Pt c/o medication issue:  1. Name of Medication: 1. isosorbide mononitrate 30 mg, 2. hydralazine 10mg   2. How are you currently taking this medication (dosage and times per day)? 1. One tab daily, 2. one tab three times daily  3. Are you having a reaction (difficulty breathing--STAT)? no  4. What is your medication issue? Pt is concerned with the side effects and it affecting his daily job.

## 2016-05-28 NOTE — Telephone Encounter (Signed)
Left message for patient to call back.  He is at work.

## 2016-05-31 NOTE — Telephone Encounter (Signed)
PT  ONLY TRIED  ISOSORBIDE  AND  THOUGHT IT MADE  HIM  DIZZY. INSTRUCTED PT  TO TRY AND TAKE LATER IN THE  DAY  AND  SEE IF   THIS IS  BETTER. PT  AGREEABLE ALSO  DID  NOT  WANT  TO TRY OTHER  MED  BECAUSE  OF  SIDE EFFECTS.  INSTRUCTED PT   THAT  HE MAY TOLERATE AND TO TRY. ALL  MEDS  HAVE  POTENTIAL OF  SIDE EFFECTS , BUT  WILL NOT  KNOW   UNTIL  TAKES  MED  AND  SEES .WILL FORWARD  TO DR  ROSS  FOR REVIEW  .Zack Seal

## 2016-05-31 NOTE — Telephone Encounter (Signed)
Follow Up Call:  Mr. Brackens is calling because he has a question about his medication and the side effects . Please call .Marland Kitchen Thanks

## 2016-06-02 ENCOUNTER — Other Ambulatory Visit: Payer: Self-pay | Admitting: Internal Medicine

## 2016-06-02 NOTE — Telephone Encounter (Signed)
Agree with recommendations It would be good if he could give medications another try.

## 2016-06-18 ENCOUNTER — Telehealth: Payer: Self-pay | Admitting: Family

## 2016-06-18 NOTE — Telephone Encounter (Signed)
Called patient to schedule awv. Left message for patient to call office to schedule appt. °

## 2016-07-22 ENCOUNTER — Encounter: Payer: Self-pay | Admitting: Internal Medicine

## 2016-07-23 ENCOUNTER — Encounter: Payer: Self-pay | Admitting: Internal Medicine

## 2016-07-28 ENCOUNTER — Other Ambulatory Visit: Payer: Self-pay | Admitting: Family

## 2016-07-28 NOTE — Telephone Encounter (Signed)
eScribe request from [facility] for refill on Klor-Con Last filled - 02/07/16, #90x1 Last AEX - 05/05/16 Next AEX - 3-Mths. Refill sent per [facility] refill protocol/SLS

## 2016-08-03 ENCOUNTER — Encounter: Payer: Self-pay | Admitting: Internal Medicine

## 2016-08-04 ENCOUNTER — Ambulatory Visit: Payer: Medicare Other | Admitting: Family

## 2016-08-04 ENCOUNTER — Telehealth: Payer: Self-pay | Admitting: Family

## 2016-08-04 NOTE — Telephone Encounter (Signed)
OK, no charge plese.

## 2016-08-04 NOTE — Telephone Encounter (Signed)
Spouse called stating patient forget about he's 7:45am appointment today patient [facility] to 08/11/16 with PCP, charge or no charge

## 2016-08-11 ENCOUNTER — Ambulatory Visit (INDEPENDENT_AMBULATORY_CARE_PROVIDER_SITE_OTHER): Payer: Medicare Other | Admitting: Family

## 2016-08-11 ENCOUNTER — Other Ambulatory Visit: Payer: Self-pay | Admitting: Family

## 2016-08-11 ENCOUNTER — Encounter: Payer: Self-pay | Admitting: Family

## 2016-08-11 VITALS — BP 114/66 | HR 74 | Temp 98.1°F | Resp 16 | Ht 70.0 in | Wt 219.0 lb

## 2016-08-11 DIAGNOSIS — E785 Hyperlipidemia, unspecified: Secondary | ICD-10-CM

## 2016-08-11 DIAGNOSIS — R739 Hyperglycemia, unspecified: Secondary | ICD-10-CM

## 2016-08-11 DIAGNOSIS — G4733 Obstructive sleep apnea (adult) (pediatric): Secondary | ICD-10-CM

## 2016-08-11 DIAGNOSIS — Z0000 Encounter for general adult medical examination without abnormal findings: Secondary | ICD-10-CM | POA: Diagnosis not present

## 2016-08-11 DIAGNOSIS — I10 Essential (primary) hypertension: Secondary | ICD-10-CM

## 2016-08-11 LAB — BASIC METABOLIC PANEL
BUN: 14 mg/dL (ref 6–23)
CHLORIDE: 105 meq/L (ref 96–112)
CO2: 26 meq/L (ref 19–32)
Calcium: 9.5 mg/dL (ref 8.4–10.5)
Creatinine, Ser: 1.16 mg/dL (ref 0.40–1.50)
GFR: 80.15 mL/min (ref >60.00)
GLUCOSE: 174 mg/dL — AB (ref 70–99)
POTASSIUM: 4 meq/L (ref 3.5–5.1)
SODIUM: 139 meq/L (ref 135–145)

## 2016-08-11 LAB — HEMOGLOBIN A1C: Hgb A1c MFr Bld: 6 % (ref 4.6–6.5)

## 2016-08-11 NOTE — Assessment & Plan Note (Signed)
Stable on lipitor. Continue same.  

## 2016-08-11 NOTE — Progress Notes (Signed)
Subjective:    Patient ID: [name], male    DOB: [birth year], 71 y.o.   MRN: [id number]  HPI   [name] is a 71 yr old male who presents today for follow up.  1) HTN- currently maintained on hydralazine, lasix. imdur, aldactone, coreg.Denies CP/SOB/swelling.  BP Readings from Last 3 Encounters:  08/11/16 114/66  05/20/16 132/76  05/05/16 113/67   Wt Readings from Last 3 Encounters:  08/11/16 219 lb (99.3 kg)  05/20/16 221 lb 12.8 oz (100.6 kg)  05/05/16 216 lb 3.2 oz (98.1 kg)    2) Hyperlipidemia- maintained on lipitor. Denies myalgia. Lab Results  Component Value Date   CHOL 113 05/05/2016   HDL 48.60 05/05/2016   LDLCALC 52 05/05/2016   TRIG 60.0 05/05/2016   CHOLHDL 2 05/05/2016   3) OSA-  Reports good compliance with CPAP.    Review of Systems    see HPI  Past Medical History:  Diagnosis Date  . Anomalous right coronary artery   . History of ETOH abuse   . Hypertension   . Non-ischemic cardiomyopathy (HCC)    Presumed secondary to Etoh and HTN  (Prev EF 20-25% -- improved to 50%)  . OSA (obstructive sleep apnea) 10/17/2013   Severe per home sleep study performed on 09/26/13.   Marland Kitchen Routine general medical examination at a health care facility      Social History   Social History  . Marital status: Married    Spouse name: [name]  . Number of children: 3  . Years of education: N/A   Occupational History  . Retired Radiographer, therapeutic    Social History Main Topics  . Smoking status: Former Smoker    Years: 20.00    Quit date: 04/12/2002  . Smokeless tobacco: Never Used  . Alcohol use 0.0 oz/week     Comment: 1 glass of wine each night.    . Drug use: No     Comment: history of marijuana use  . Sexual activity: Not on file   Other Topics Concern  . Not on file   Social History Narrative   2 sons and daughter   1 son in [name]. Living with wife 71yrs [name]).  Use to live near [place].    No past surgical history on file.  Family History  Problem  Relation Age of Onset  . Alcohol abuse Mother   . Heart disease Mother   . Alcohol abuse Father   . Heart failure Father   . Heart disease Maternal Grandmother   . Stroke Maternal Grandmother   . Diabetes Neg Hx     Allergies  Allergen Reactions  . Lisinopril     ANGIOEDEMA    Current Outpatient Prescriptions on File Prior to Visit  Medication Sig Dispense Refill  . atorvastatin (LIPITOR) 10 MG tablet TAKE ONE TABLET BY MOUTH ONCE DAILY AT 6PM 180 tablet 3  . carvedilol (COREG) 25 MG tablet TAKE ONE TABLET BY MOUTH TWICE DAILY 180 tablet 1  . diphenhydrAMINE (BENADRYL) 25 MG tablet Take 50 mg by mouth every 6 (six) hours as needed.    . furosemide (LASIX) 20 MG tablet TAKE ONE TABLET BY MOUTH ONCE DAILY 90 tablet 1  . hydrALAZINE (APRESOLINE) 10 MG tablet Take 1 tablet (10 mg total) by mouth 3 (three) times daily. 90 tablet 11  . isosorbide mononitrate (IMDUR) 30 MG 24 hr tablet Take 1 tablet (30 mg total) by mouth daily. 30 tablet 11  . KLOR-CON M10  10 MEQ tablet TAKE ONE TABLET BY MOUTH ONCE DAILY 90 tablet 0  . spironolactone (ALDACTONE) 25 MG tablet TAKE ONE-HALF TABLET BY MOUTH ONCE DAILY 90 tablet 3   No current facility-administered medications on file prior to visit.     BP 114/66 (BP Location: Right Arm, Cuff Size: Large)   Pulse 74   Temp 98.1 F (36.7 C) (Oral)   Resp 16   Ht 5\' 10"  (1.778 m)   Wt 219 lb (99.3 kg)   SpO2 98% Comment: room air  BMI 31.42 kg/m    Objective:   Physical Exam  Constitutional: He is oriented to person, place, and time. He appears well-developed and well-nourished. No distress.  HENT:  Head: Normocephalic and atraumatic.  Cardiovascular: Normal rate and regular rhythm.   No murmur heard. Pulmonary/Chest: Effort normal and breath sounds normal. No respiratory distress. He has no wheezes. He has no rales.  Musculoskeletal: He exhibits no edema.  Neurological: He is alert and oriented to person, place, and time.  Skin: Skin is  warm and dry.  Psychiatric: He has a normal mood and affect. His behavior is normal. Thought content normal.          Assessment & Plan:

## 2016-08-11 NOTE — Telephone Encounter (Signed)
Refill sent per [facility] refill protocol/SLS  

## 2016-08-11 NOTE — Assessment & Plan Note (Signed)
Stable, reports good compliance.

## 2016-08-11 NOTE — Progress Notes (Signed)
Noted and agree. 

## 2016-08-11 NOTE — Assessment & Plan Note (Signed)
Obtain follow up A1C.   

## 2016-08-11 NOTE — Patient Instructions (Signed)
Please complete lab work prior to leaving.   

## 2016-08-11 NOTE — Progress Notes (Signed)
Pre visit review using our clinic review tool, if applicable. No additional management support is needed unless otherwise documented below in the visit note. 

## 2016-08-11 NOTE — Assessment & Plan Note (Signed)
BP is stable on current meds. Continue same. Obtain follow up bmet.  

## 2016-08-11 NOTE — Progress Notes (Signed)
Subjective:   [name] is a 71 y.o. male who presents for Medicare Annual/Subsequent preventive examination.  Review of Systems:  No ROS.  Medicare Wellness Visit.  Cardiac Risk Factors include: advanced age (>30men, >59 women);dyslipidemia;male gender;hypertension;obesity (BMI >30kg/m2)  Sleep patterns: no sleep issues. Wears CPAP nightly and sleeps 7-8 hours. Does not get up to void.  Home Safety/Smoke Alarms: Feels safe in home. Smoke alarms in place.  Living environment; residence and Firearm Safety: Lives w/ wife in 3 story house. No issues navigating stairs. firearms stored safely.  Seat Belt Safety/Bike Helmet: Wears seat belt.   Counseling:   Eye Exam- Wearing glasses. Follows w/ eye doctor yearly, cannot remember name of provider.  Dental- Does not follow w/ dentist regularly. Is considering having some dental work done in the near future.  Male:   CCS- last 10/21/06. Normal, 10 year recall.      PSA-  Lab Results  Component Value Date   PSA 1.04 10/23/2012   PSA 0.92 06/26/2009   PSA 0.78 01/19/2008      Objective:    Vitals: BP 114/66 (BP Location: Right Arm, Cuff Size: Large)   Pulse 74   Temp 98.1 F (36.7 C) (Oral)   Resp 16   Ht 5\' 10"  (1.778 m)   Wt 219 lb (99.3 kg)   SpO2 98% Comment: room air  BMI 31.42 kg/m   Body mass index is 31.42 kg/m.  Wt Readings from Last 3 Encounters:  08/11/16 219 lb (99.3 kg)  05/20/16 221 lb 12.8 oz (100.6 kg)  05/05/16 216 lb 3.2 oz (98.1 kg)   Tobacco History  Smoking Status  . Former Smoker  . Years: 20.00  . Quit date: 04/12/2002  Smokeless Tobacco  . Never Used     Counseling given: Not Answered   Past Medical History:  Diagnosis Date  . Anomalous right coronary artery   . History of ETOH abuse   . Hypertension   . Non-ischemic cardiomyopathy (HCC)    Presumed secondary to Etoh and HTN  (Prev EF 20-25% -- improved to 50%)  . OSA (obstructive sleep apnea) 10/17/2013   Severe per home sleep study  performed on 09/26/13.   Marland Kitchen Routine general medical examination at a health care facility    History reviewed. No pertinent surgical history. Family History  Problem Relation Age of Onset  . Alcohol abuse Mother   . Heart disease Mother   . Alcohol abuse Father   . Heart failure Father   . Heart disease Maternal Grandmother   . Stroke Maternal Grandmother   . Diabetes Neg Hx    History  Sexual Activity  . Sexual activity: Yes    Outpatient Encounter Prescriptions as of 08/11/2016  Medication Sig  . atorvastatin (LIPITOR) 10 MG tablet TAKE ONE TABLET BY MOUTH ONCE DAILY AT 6PM  . carvedilol (COREG) 25 MG tablet TAKE ONE TABLET BY MOUTH TWICE DAILY  . diphenhydrAMINE (BENADRYL) 25 MG tablet Take 50 mg by mouth every 6 (six) hours as needed.  . furosemide (LASIX) 20 MG tablet TAKE ONE TABLET BY MOUTH ONCE DAILY  . hydrALAZINE (APRESOLINE) 10 MG tablet Take 1 tablet (10 mg total) by mouth 3 (three) times daily.  . isosorbide mononitrate (IMDUR) 30 MG 24 hr tablet Take 1 tablet (30 mg total) by mouth daily.  Marland Kitchen KLOR-CON M10 10 MEQ tablet TAKE ONE TABLET BY MOUTH ONCE DAILY  . spironolactone (ALDACTONE) 25 MG tablet TAKE ONE-HALF TABLET BY MOUTH ONCE  DAILY   No facility-administered encounter medications on file as of 08/11/2016.     Activities of Daily Living In your present state of health, do you have any difficulty performing the following activities: 08/11/2016  Hearing? N  Vision? N  Difficulty concentrating or making decisions? N  Walking or climbing stairs? N  Dressing or bathing? N  Doing errands, shopping? N  Preparing Food and eating ? N  Using the Toilet? N  In the past six months, have you accidently leaked urine? N  Do you have problems with loss of bowel control? N  Some recent data might be hidden    Patient Care Team: Sandford Craze, NP as PCP - General (Internal Medicine) Pricilla Riffle, MD as Consulting Physician (Cardiology) Barron Alvine, MD as Consulting  Physician (Urology)   Assessment:    Physical assessment deferred to PCP.  Exercise Activities and Dietary recommendations Current Exercise Habits: Home exercise routine, Type of exercise: walking, Frequency (Times/Week): 4. 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  Diet (meal preparation, eat out, water intake, caffeinated beverages, dairy products, fruits and vegetables): in general, a "healthy" diet  , well balanced, on average, 3 meals per day. Likes salad and eat one with every meal. Has a garden and eats lots of fresh vegetables. Breakfast: high fiber-oatmeal/bread and fresh fruit Lunch: apple and 32 oz of water and yogurt w/ fruit and a bag of chips Dinner: varies, always has a salad with dinner  Goals    . Eat more fruits and vegetables    . Weight (lb) < 210 lb (95.3 kg)      Fall Risk Fall Risk  08/11/2016 05/05/2016 06/20/2015 02/25/2014 02/25/2014  Falls in the past year? No No No No No   Depression Screen PHQ 2/9 Scores 08/11/2016 05/05/2016 02/02/2016 06/20/2015  PHQ - 2 Score 0 0 0 0    Cognitive Function MMSE - Mini Mental State Exam 06/20/2015  Orientation to time 5  Orientation to Place 5  Registration 3  Attention/ Calculation 5  Recall 3  Language- name 2 objects 2  Language- repeat 1  Language- follow 3 step command 3  Language- read & follow direction 1  Write a sentence 1  Copy design 1  Total score 30        Ad8 score reviewed for issues:  Issues making decisions: No  Less interest in hobbies / activities: No  Repeats questions, stories (family complaining): No  Trouble using ordinary gadgets (microwave, computer, phone): No  Forgets the month or year: No  Mismanaging finances: No  Remembering appts: No  Daily problems with thinking and/or memory: No Ad8 score is= 0     Immunization History  Administered Date(s) Administered  . Influenza Split 02/23/2011, 01/13/2012  . Influenza Whole  01/19/2008, 01/20/2009, 12/18/2009  . Influenza, High Dose Seasonal PF 02/02/2016  . Influenza,inj,Quad PF,36+ Mos 01/03/2013, 01/21/2014, 02/05/2015  . Pneumococcal Conjugate-13 01/21/2014, 02/05/2015  . Pneumococcal Polysaccharide-23 01/19/2008, 02/02/2016  . Td 01/29/2008  . Zoster 08/24/2011   Screening Tests Health Maintenance  Topic Date Due  . INFLUENZA VACCINE  11/10/2016  . COLONOSCOPY  05/21/2017  . TETANUS/TDAP  01/28/2018  . Hepatitis C Screening  Completed  . PNA vac Low Risk Adult  Completed      Plan:    Follow-up w/ PCP as directed.  I have personally reviewed and noted the following in the patient's chart:   . Medical and social  history . Use of alcohol, tobacco or illicit drugs  . Current medications and supplements . Functional ability and status . Nutritional status . Physical activity . Advanced directives . List of other physicians . Vitals . Screenings to include cognitive, depression, and falls . Referrals and appointments  In addition, I have reviewed and discussed with patient certain preventive protocols, quality metrics, and best practice recommendations.     Starla Link, RN  08/11/2016

## 2016-08-12 ENCOUNTER — Ambulatory Visit: Payer: Medicare Other | Admitting: Internal Medicine

## 2016-08-18 ENCOUNTER — Ambulatory Visit (INDEPENDENT_AMBULATORY_CARE_PROVIDER_SITE_OTHER): Payer: Medicare Other | Admitting: Physician Assistant

## 2016-08-18 ENCOUNTER — Encounter: Payer: Self-pay | Admitting: Physician Assistant

## 2016-08-18 VITALS — BP 116/68 | HR 72 | Ht 69.5 in | Wt 223.1 lb

## 2016-08-18 DIAGNOSIS — I5022 Chronic systolic (congestive) heart failure: Secondary | ICD-10-CM | POA: Insufficient documentation

## 2016-08-18 DIAGNOSIS — I2510 Atherosclerotic heart disease of native coronary artery without angina pectoris: Secondary | ICD-10-CM | POA: Diagnosis not present

## 2016-08-18 DIAGNOSIS — I10 Essential (primary) hypertension: Secondary | ICD-10-CM | POA: Diagnosis not present

## 2016-08-18 DIAGNOSIS — I2584 Coronary atherosclerosis due to calcified coronary lesion: Secondary | ICD-10-CM | POA: Diagnosis not present

## 2016-08-18 DIAGNOSIS — I428 Other cardiomyopathies: Secondary | ICD-10-CM | POA: Diagnosis not present

## 2016-08-18 DIAGNOSIS — E785 Hyperlipidemia, unspecified: Secondary | ICD-10-CM

## 2016-08-18 HISTORY — DX: Coronary atherosclerosis due to calcified coronary lesion: I25.84

## 2016-08-18 HISTORY — DX: Atherosclerotic heart disease of native coronary artery without angina pectoris: I25.10

## 2016-08-18 HISTORY — DX: Chronic systolic (congestive) heart failure: I50.22

## 2016-08-18 MED ORDER — ASPIRIN EC 81 MG PO TBEC: 81.0000 mg | DELAYED_RELEASE_TABLET | Freq: Every day | ORAL | 3 refills | Status: AC

## 2016-08-18 NOTE — Progress Notes (Signed)
Cardiology Office Note:    Date:  08/18/2016   ID:  [name], DOB 10/26/1945, MRN [id number]  PCP:  Sandford Craze, NP  Cardiologist:  Dr. Dietrich Pates    Referring MD: Sandford Craze, NP   Chief Complaint  Patient presents with  . Congestive Heart Failure    Follow up    History of Present Illness:    [name] is a 71 y.o. male with a hx of systolic heart failure in the setting of nonischemic cardiomyopathy, mild aortic stenosis, ACE inhibitor allergy, prior alcohol abuse, HTN, sleep apnea. Calcium score by coronary CTA in 1/08 was 248. However, cardiac catheterization demonstrated minimal coronary plaque. Last seen by Dr. Tenny Craw 2/18.  He was taken off of his ACE inhibitor due to an allergy.  He is now on Hydralazine and Nitrates. He is doing well without side effects. Since last seen, he denies chest pain, shortness of breath, syncope, orthopnea, PND or significant pedal edema. 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 He plans to start swimming soon at the [facility].    Prior CV studies:   The following studies were reviewed today:  Echo 1/17 Diffuse HK especially in inferior wall, mild LVH, EF 30-35, mild aortic stenosis (mean 10, peak 18)  Echo 11/15 Mild LVH, EF 30-35, moderate HK, grade 1 diastolic dysfunction, mild AI, mild RAE  AAA [place] 7/14 Normal size abdominal aorta  LHC 1/08 LAD ostial 25 EF 25  Coronary CTA 1/08 FINAL IMPRESSION: 1.   Anomalous right coronary artery coming off the left main without critical stenosis.  However, the artery does course between the pulmonary and aortic valve. 2.   Less than 50% calcific plaque in the proximal LAD with no critical stenoses in the right coronary artery and circumflex. 3.   Global hypokinesis.  Quantitative ejection fraction 30%. 4.   Calcium score 248 primarily consisting of a plaque in the proximal LAD and mid circumflex. 5.   Bihilar adenopathy and 2 left lower lobe lung  nodules.  The former finding can be seen with congestive failure.  Correlate with any history of malignacy.  Consider follow-up with dedicated chest CT when patient is clinically improved/ as outpatient.  2-3 month interval follow-up would be ideal.    Past Medical History:  Diagnosis Date  . Anomalous right coronary artery   . Chronic systolic CHF (congestive heart failure) (HCC) 08/18/2016   Echo 1/17:  Diff HK especially in inf wall, mild LVH, EF 30-35, mild AS (mean 10, peak 18) // Echo 11/15: Mild LVH, EF 30-35, moderate HK, Gr 1 DD, mild AI, mild RAE  . Coronary artery calcification 08/18/2016   Cor CTA 1/08: Ca score 248 // LHC 1/08: oLAD 25%  . History of ETOH abuse   . Hyperlipidemia 08/27/2013  . Hypertension   . Non-ischemic cardiomyopathy (HCC)    Presumed secondary to Etoh and HTN  (Prev EF 20-25% -- improved to 50%) // LHC 1/08: oLAD 25, EF 25%   . OSA (obstructive sleep apnea) 10/17/2013   Severe per home sleep study performed on 09/26/13.     History reviewed. No pertinent surgical history.  Current Medications: Current Meds  Medication Sig  . atorvastatin (LIPITOR) 10 MG tablet TAKE ONE TABLET BY MOUTH ONCE DAILY AT 6PM  . carvedilol (COREG) 25 MG tablet TAKE ONE TABLET BY MOUTH TWICE DAILY  . diphenhydrAMINE (BENADRYL) 25 MG tablet Take 50 mg by mouth every 6 (six) hours  as needed.  . furosemide (LASIX) 20 MG tablet TAKE ONE TABLET BY MOUTH ONCE DAILY  . hydrALAZINE (APRESOLINE) 10 MG tablet Take 1 tablet (10 mg total) by mouth 3 (three) times daily.  . isosorbide mononitrate (IMDUR) 30 MG 24 hr tablet Take 1 tablet (30 mg total) by mouth daily.  Marland Kitchen KLOR-CON M10 10 MEQ tablet TAKE ONE TABLET BY MOUTH ONCE DAILY  . spironolactone (ALDACTONE) 25 MG tablet TAKE ONE-HALF TABLET BY MOUTH ONCE DAILY     Allergies:   Lisinopril   Social History   Social History  . Marital status: Married    Spouse name: [name]  . Number of children: 3  . Years of education: N/A    Occupational History  . Retired Radiographer, therapeutic    Social History Main Topics  . Smoking status: Former Smoker    Years: 20.00    Quit date: 04/12/2002  . Smokeless tobacco: Never Used  . Alcohol use 4.2 oz/week    7 Glasses of wine per week     Comment: 1 glass of wine each night.    . Drug use: No     Comment: history of marijuana use  . Sexual activity: Yes   Other Topics Concern  . None   Social History Narrative   2 sons and daughter   1 son in [name]. Living with wife 42yrs [name]).  Use to live near [place].     Family Hx: The patient's family history includes Alcohol abuse in his father and mother; Heart disease in his maternal grandmother and mother; Heart failure in his father; Stroke in his maternal grandmother. There is no history of Diabetes.  ROS:   Please see the history of present illness.    ROS All other systems reviewed and are negative.   EKGs/Labs/Other Test Reviewed:    EKG:  EKG is  ordered today.  The ekg ordered today demonstrates NSR, HR 72, LAD, inf Q waves, QTc 444 ms, 1st degree AVB (PR 218 ms), no sig changes  Recent Labs: 08/11/2016: BUN 14; Creatinine, Ser 1.16; Potassium 4.0; Sodium 139   Recent Lipid Panel    Component Value Date/Time   CHOL 113 05/05/2016 0821   TRIG 60.0 05/05/2016 0821   HDL 48.60 05/05/2016 0821   CHOLHDL 2 05/05/2016 0821   VLDL 12.0 05/05/2016 0821   LDLCALC 52 05/05/2016 0821     Physical Exam:    VS:  BP 116/68   Pulse 72   Ht 5' 9.5" (1.765 m)   Wt 223 lb 1.9 oz (101.2 kg)   BMI 32.48 kg/m     Wt Readings from Last 3 Encounters:  08/18/16 223 lb 1.9 oz (101.2 kg)  08/11/16 219 lb (99.3 kg)  05/20/16 221 lb 12.8 oz (100.6 kg)     Physical Exam  Constitutional: He is oriented to person, place, and time. He appears well-developed and well-nourished. No distress.  HENT:  Head: Normocephalic and atraumatic.  Eyes: No scleral icterus.  Neck: Normal range of motion. No JVD present.  Cardiovascular:  Normal rate, regular rhythm, S1 normal and S2 normal.   No murmur heard. Pulmonary/Chest: Effort normal and breath sounds normal. He has no wheezes. He has no rhonchi. He has no rales.  Abdominal: Soft. There is no tenderness.  Musculoskeletal: He exhibits no edema.  Neurological: He is alert and oriented to person, place, and time.  Skin: Skin is warm and dry.  Psychiatric: He has a normal mood and affect.  ASSESSMENT:    1. Chronic systolic CHF (congestive heart failure) (HCC)   2. NICM (nonischemic cardiomyopathy) (HCC)   3. Coronary artery calcification   4. Essential hypertension   5. Hyperlipidemia, unspecified hyperlipidemia type    PLAN:    In order of problems listed above:  1. Chronic systolic CHF (congestive heart failure) (HCC) -  NYHA 1 on current Rx.  Continue beta-blocker, hydralazine, nitrates, spironolactone.  He is allergic to ACE inhibitor.    2. NICM (nonischemic cardiomyopathy) (HCC) -  He has not had an echo since 1/17.  EF was 30-35 at that time.  Plan repeat Echo in 3 mos with follow up.  If EF remains < 35, will need to consider referral to EP for +/- ICD.  3. Coronary artery calcification - Continue statin.  Start ASA 81 QD.  4. Essential hypertension - BP is optimal.  Continue current Rx.   5. Hyperlipidemia, unspecified hyperlipidemia type - Continue statin.  Managed by PCP.  Dispo:  Return in about 3 months (around 11/18/2016) for Routine Follow Up, w/ Dr. Tenny Craw.   Medication Adjustments/Labs and Tests Ordered: Current medicines are reviewed at length with the patient today.  Concerns regarding medicines are outlined above.  Orders/Tests:  Orders Placed This Encounter  Procedures  . EKG 12-Lead  . ECHOCARDIOGRAM COMPLETE   Medication changes: Meds ordered this encounter  Medications  . aspirin EC 81 MG tablet    Sig: Take 1 tablet (81 mg total) by mouth daily.    Dispense:  90 tablet    Refill:  3   Signed, Tereso Newcomer, PA-C   08/18/2016 10:19 AM    [facility] [address] [place], [place], [place]  [address] Phone: [phone]; Fax: [phone]

## 2016-08-18 NOTE — Patient Instructions (Addendum)
Medication Instructions:  Your physician has recommended you make the following change in your medication:  1-START Aspirin 81 mg by mouth daily  Labwork: NONE TODAY  Testing/Procedures: Your physician has requested that you have an echocardiogram in August. Echocardiography is a painless test that uses sound waves to create images of your heart. It provides your doctor with information about the size and shape of your heart and how well your heart's chambers and valves are working. This procedure takes approximately one hour. There are no restrictions for this procedure.  Follow-Up: Your physician recommends that you schedule a follow-up appointment in: August with Dr. Tenny Craw after echocardiogram.    Any Other Special Instructions Will Be Listed Below (If Applicable).     If you need a refill on your cardiac medications before your next appointment, please call your pharmacy.

## 2016-09-03 ENCOUNTER — Encounter: Payer: Self-pay | Admitting: Gastroenterology

## 2016-10-20 ENCOUNTER — Other Ambulatory Visit: Payer: Self-pay | Admitting: Family

## 2016-11-11 ENCOUNTER — Ambulatory Visit (HOSPITAL_COMMUNITY): Payer: Medicare Other | Attending: Cardiovascular Disease

## 2016-11-11 ENCOUNTER — Other Ambulatory Visit: Payer: Self-pay

## 2016-11-11 DIAGNOSIS — I428 Other cardiomyopathies: Secondary | ICD-10-CM

## 2016-11-11 DIAGNOSIS — I352 Nonrheumatic aortic (valve) stenosis with insufficiency: Secondary | ICD-10-CM | POA: Diagnosis not present

## 2016-11-11 DIAGNOSIS — I5022 Chronic systolic (congestive) heart failure: Secondary | ICD-10-CM | POA: Insufficient documentation

## 2016-11-11 MED ORDER — PERFLUTREN LIPID MICROSPHERE
1.0000 mL | INTRAVENOUS | Status: AC | PRN
  Administered 2016-11-11: 2 mL via INTRAVENOUS

## 2016-11-15 ENCOUNTER — Encounter: Payer: Self-pay | Admitting: Physician Assistant

## 2016-11-15 ENCOUNTER — Telehealth: Payer: Self-pay | Admitting: *Deleted

## 2016-11-15 NOTE — Telephone Encounter (Signed)
DPR on file ok to s/w pt's wife, though she states she will have pt call back Wed on his day off to get his test results. I thanked her for her help.

## 2016-11-15 NOTE — Telephone Encounter (Signed)
-----   Message from Scott T Weaver, PA-C sent at 11/15/2016  5:19 PM EDT ----- Please call the patient. Heart function is unchanged. Ejection fraction is 30-35%. I reviewed with Dr. Ross.  Continue current therapy and discuss findings at follow-up later this month. Please fax a copy of this study result to his PCP:  O'Sullivan, Melissa, NP  Thanks! Scott Weaver, PA-C    11/15/2016 5:17 PM  

## 2016-11-17 ENCOUNTER — Telehealth: Payer: Self-pay | Admitting: *Deleted

## 2016-11-17 NOTE — Telephone Encounter (Signed)
Follow up  ° ° ° °Patient returning call back to nurse from Monday.   °

## 2016-11-17 NOTE — Telephone Encounter (Signed)
Ptcb and has been notified of echo results/findings by phone with verbal understanding. Pt agreeable to plan of care to f/u with Dr. Tenny Craw 12/02/16 @ 8 am to discuss results further. Pt aware to continue on current Tx plan at this time. Pt thanked me for my call today.

## 2016-11-17 NOTE — Telephone Encounter (Signed)
-----   Message from Scott T Weaver, PA-C sent at 11/15/2016  5:19 PM EDT ----- Please call the patient. Heart function is unchanged. Ejection fraction is 30-35%. I reviewed with Dr. Ross.  Continue current therapy and discuss findings at follow-up later this month. Please fax a copy of this study result to his PCP:  O'Sullivan, Melissa, NP  Thanks! Scott Weaver, PA-C    11/15/2016 5:17 PM  

## 2016-11-17 NOTE — Telephone Encounter (Signed)
I returned pt's call back and lmtcb to go over echo results and recommendations.

## 2016-11-17 NOTE — Telephone Encounter (Signed)
-----   Message from Beatrice Lecher, [place] sent at 11/15/2016  5:19 PM EDT ----- Please call the patient. Heart function is unchanged. Ejection fraction is 30-35%. I reviewed with Dr. Tenny Craw.  Continue current therapy and discuss findings at follow-up later this month. Please fax a copy of this study result to his PCP:  Sandford Craze, NP  Thanks! Tereso Newcomer, PA-C    11/15/2016 5:17 PM

## 2016-12-01 NOTE — Progress Notes (Signed)
Cardiology Office Note   Date:  12/02/2016   ID:  [name], [name] [birth year], MRN [id number]  PCP:  Sandford Craze, NP  Cardiologist:   Dietrich Pates, MD   F/U of NICM     History of Present Illness: [name] is a 71 y.o. male with a history of CHF  I saw him in 2016  CT scan showed mild CAD   Echo in Jan 2017  LVEF 30 to 35%  MIld AS   Seen in ER for angioedema of lip  Flet to be due to lisinopril  D/C's   I saw the pt in clinic in Feb 2018  At that visst I added hydralazine 10 tid  Imdur 30   He was seen in f/u by Wende Mott  Plan for f/u echo  This was done on 8/3  LVEF was 30 to 35%  The pt says his breathing is good  He works  Can wald up a few flights of stairs without a problem   No PND  No edema  No dizziness  No palpitations  No CP    Current Meds  Medication Sig  . aspirin EC 81 MG tablet Take 1 tablet (81 mg total) by mouth daily.  Marland Kitchen atorvastatin (LIPITOR) 10 MG tablet TAKE ONE TABLET BY MOUTH ONCE DAILY AT 6PM  . carvedilol (COREG) 25 MG tablet TAKE ONE TABLET BY MOUTH TWICE DAILY  . diphenhydrAMINE (BENADRYL) 25 MG tablet Take 50 mg by mouth every 6 (six) hours as needed.  . furosemide (LASIX) 20 MG tablet TAKE ONE TABLET BY MOUTH ONCE DAILY  . hydrALAZINE (APRESOLINE) 10 MG tablet Take 1 tablet (10 mg total) by mouth 3 (three) times daily.  . isosorbide mononitrate (IMDUR) 30 MG 24 hr tablet Take 1 tablet (30 mg total) by mouth daily.  Marland Kitchen KLOR-CON M10 10 MEQ tablet TAKE 1 TABLET BY MOUTH ONCE DAILY  . spironolactone (ALDACTONE) 25 MG tablet TAKE ONE-HALF TABLET BY MOUTH ONCE DAILY     Allergies:   Lisinopril   Past Medical History:  Diagnosis Date  . Anomalous right coronary artery   . Chronic systolic CHF (congestive heart failure) (HCC) 08/18/2016   Echo 1/17:  Diff HK especially in inf wall, mild LVH, EF 30-35, mild AS (mean 10, peak 18) // Echo 11/15: Mild LVH, EF 30-35, moderate HK, Gr 1 DD, mild AI, mild RAE // Echo 8/18: Mild LVH, EF 30-35,  diffuse HK, mild aortic stenosis (mean 7), mild AI, mild BAE  . Coronary artery calcification 08/18/2016   Cor CTA 1/08: Ca score 248 // LHC 1/08: oLAD 25%  . History of ETOH abuse   . Hyperlipidemia 08/27/2013  . Hypertension   . Non-ischemic cardiomyopathy (HCC)    Presumed secondary to Etoh and HTN  (Prev EF 20-25% -- improved to 50%) // LHC 1/08: oLAD 25, EF 25%   . OSA (obstructive sleep apnea) 10/17/2013   Severe per home sleep study performed on 09/26/13.     History reviewed. No pertinent surgical history.   Social History:  The patient  reports that he quit smoking about 14 years ago. He quit after 20.00 years of use. He has never used smokeless tobacco. He reports that he drinks about 4.2 oz of alcohol per week . He reports that he does not use drugs.   Family History:  The patient's family history includes Alcohol abuse in his father and mother; Heart disease in his maternal grandmother and mother;  Heart failure in his father; Stroke in his maternal grandmother.    ROS:  Please see the history of present illness. All other systems are reviewed and  Negative to the above problem except as noted.    PHYSICAL EXAM: VS:  BP 134/84   Pulse 73   Ht 5' 9.5" (1.765 m)   Wt 218 lb 3.2 oz (99 kg)   SpO2 95%   BMI 31.76 kg/m   GEN: Well nourished, well developed, in no acute distress  HEENT: normal  Neck: no JVD, carotid bruits, or masses Cardiac: RRR; no murmurs, rubs, or gallops,no edema  Respiratory:  clear to auscultation bilaterally, normal work of breathing GI: soft, nontender, nondistended, + BS  No hepatomegaly  MS: no deformity Moving all extremities   Skin: warm and dry, no rash Neuro:  Strength and sensation are intact Psych: euthymic mood, full affect   EKG:  EKG is not ordered today.     Lipid Panel    Component Value Date/Time   CHOL 113 05/05/2016 0821   TRIG 60.0 05/05/2016 0821   HDL 48.60 05/05/2016 0821   CHOLHDL 2 05/05/2016 0821   VLDL 12.0  05/05/2016 0821   LDLCALC 52 05/05/2016 0821      Wt Readings from Last 3 Encounters:  12/02/16 218 lb 3.2 oz (99 kg)  08/18/16 223 lb 1.9 oz (101.2 kg)  08/11/16 219 lb (99.3 kg)      ASSESSMENT AND PLAN:  1  Chronic systolic CHF  Volume status is good  Cannot take ace I or ARB  Tolerating current medicines  BP is better at home 110s   Will review with EP   2  Lipids   LDL in Jan is 52 3  HTN  BP is better at home    F/U in Feb    Current medicines are reviewed at length with the patient today.  The patient does not have concerns regarding medicines.  Signed, Dietrich Pates, MD  12/02/2016 8:16 AM    [facility] [address] [place], [place], [place]  [address] Phone: [phone]; Fax: [phone]

## 2016-12-02 ENCOUNTER — Encounter: Payer: Self-pay | Admitting: Internal Medicine

## 2016-12-02 ENCOUNTER — Ambulatory Visit (INDEPENDENT_AMBULATORY_CARE_PROVIDER_SITE_OTHER): Payer: Medicare Other | Admitting: Internal Medicine

## 2016-12-02 VITALS — BP 134/84 | HR 73 | Ht 69.5 in | Wt 218.2 lb

## 2016-12-02 DIAGNOSIS — I10 Essential (primary) hypertension: Secondary | ICD-10-CM | POA: Diagnosis not present

## 2016-12-02 DIAGNOSIS — I428 Other cardiomyopathies: Secondary | ICD-10-CM | POA: Diagnosis not present

## 2016-12-02 DIAGNOSIS — I2584 Coronary atherosclerosis due to calcified coronary lesion: Secondary | ICD-10-CM | POA: Diagnosis not present

## 2016-12-02 DIAGNOSIS — I2510 Atherosclerotic heart disease of native coronary artery without angina pectoris: Secondary | ICD-10-CM

## 2016-12-02 DIAGNOSIS — E785 Hyperlipidemia, unspecified: Secondary | ICD-10-CM

## 2016-12-02 NOTE — Patient Instructions (Signed)
Your physician recommends that you continue on your current medications as directed. Please refer to the Current Medication list given to you today. Your physician wants you to follow-up in: 6 months with Dr. Ross.  You will receive a reminder letter in the mail two months in advance. If you don't receive a letter, please call our office to schedule the follow-up appointment.  

## 2017-02-02 ENCOUNTER — Ambulatory Visit (INDEPENDENT_AMBULATORY_CARE_PROVIDER_SITE_OTHER): Payer: Medicare Other

## 2017-02-02 DIAGNOSIS — Z23 Encounter for immunization: Secondary | ICD-10-CM | POA: Diagnosis not present

## 2017-02-03 ENCOUNTER — Other Ambulatory Visit: Payer: Self-pay | Admitting: Family

## 2017-02-16 ENCOUNTER — Encounter: Payer: Self-pay | Admitting: Family

## 2017-02-16 ENCOUNTER — Telehealth: Payer: Self-pay | Admitting: Family

## 2017-02-16 ENCOUNTER — Ambulatory Visit (INDEPENDENT_AMBULATORY_CARE_PROVIDER_SITE_OTHER): Payer: Medicare Other | Admitting: Family

## 2017-02-16 VITALS — BP 138/78 | HR 65 | Temp 98.2°F | Resp 16 | Ht 70.0 in | Wt 222.4 lb

## 2017-02-16 DIAGNOSIS — I2584 Coronary atherosclerosis due to calcified coronary lesion: Secondary | ICD-10-CM | POA: Diagnosis not present

## 2017-02-16 DIAGNOSIS — R739 Hyperglycemia, unspecified: Secondary | ICD-10-CM

## 2017-02-16 DIAGNOSIS — G4733 Obstructive sleep apnea (adult) (pediatric): Secondary | ICD-10-CM

## 2017-02-16 DIAGNOSIS — E785 Hyperlipidemia, unspecified: Secondary | ICD-10-CM

## 2017-02-16 DIAGNOSIS — I10 Essential (primary) hypertension: Secondary | ICD-10-CM

## 2017-02-16 DIAGNOSIS — I2510 Atherosclerotic heart disease of native coronary artery without angina pectoris: Secondary | ICD-10-CM | POA: Diagnosis not present

## 2017-02-16 DIAGNOSIS — E1165 Type 2 diabetes mellitus with hyperglycemia: Secondary | ICD-10-CM

## 2017-02-16 LAB — LIPID PANEL
CHOL/HDL RATIO: 3
Cholesterol: 124 mg/dL (ref 0–200)
HDL: 49.2 mg/dL (ref >39.00)
LDL CALC: 59 mg/dL (ref 0–99)
NonHDL: 74.54
TRIGLYCERIDES: 79 mg/dL (ref 0.0–149.0)
VLDL: 15.8 mg/dL (ref 0.0–40.0)

## 2017-02-16 LAB — BASIC METABOLIC PANEL
BUN: 16 mg/dL (ref 6–23)
CHLORIDE: 105 meq/L (ref 96–112)
CO2: 31 meq/L (ref 19–32)
CREATININE: 1.17 mg/dL (ref 0.40–1.50)
Calcium: 9.3 mg/dL (ref 8.4–10.5)
GFR: 79.24 mL/min (ref >60.00)
Glucose, Bld: 112 mg/dL — ABNORMAL HIGH (ref 70–99)
POTASSIUM: 4.3 meq/L (ref 3.5–5.1)
Sodium: 139 mEq/L (ref 135–145)

## 2017-02-16 LAB — HEMOGLOBIN A1C: HEMOGLOBIN A1C: 6 % (ref 4.6–6.5)

## 2017-02-16 MED ORDER — POTASSIUM CHLORIDE CRYS ER 10 MEQ PO TBCR: 10.0000 meq | EXTENDED_RELEASE_TABLET | Freq: Every day | ORAL | 1 refills | Status: DC

## 2017-02-16 MED ORDER — CARVEDILOL 25 MG PO TABS: 25.0000 mg | ORAL_TABLET | Freq: Two times a day (BID) | ORAL | 1 refills | Status: DC

## 2017-02-16 MED ORDER — FUROSEMIDE 20 MG PO TABS: 20.0000 mg | ORAL_TABLET | Freq: Every day | ORAL | 1 refills | Status: DC

## 2017-02-16 NOTE — Telephone Encounter (Signed)
Spoke with Edgardo Roys at [facility], [phone] and requested download of the last 30 days be faxed to nurses station. Awaiting report.

## 2017-02-16 NOTE — Patient Instructions (Signed)
Please complete lab work prior to leaving.   

## 2017-02-16 NOTE — Progress Notes (Signed)
Subjective:    Patient ID: [name], male    DOB: 09/16/1945, 71 y.o.   MRN: [id number]  HPI  [name] is a 71 yr old male who presents today for follow up.  1) Hyperlipidemia- maintained on lipitor 10mg .  Denies myalgia. Lab Results  Component Value Date   CHOL 113 05/05/2016   HDL 48.60 05/05/2016   LDLCALC 52 05/05/2016   TRIG 60.0 05/05/2016   CHOLHDL 2 05/05/2016   2) HTN- maintianed on imdur, aldactone,  lasix, coreg and hydralazine. Denies CP/SOB/Swelling BP Readings from Last 3 Encounters:  02/16/17 138/78  12/02/16 134/84  08/18/16 116/68   3) OSA- continues cpap, reports that he   Review of Systems    see HPI  Past Medical History:  Diagnosis Date  . Anomalous right coronary artery   . Chronic systolic CHF (congestive heart failure) (HCC) 08/18/2016   Echo 1/17:  Diff HK especially in inf wall, mild LVH, EF 30-35, mild AS (mean 10, peak 18) // Echo 11/15: Mild LVH, EF 30-35, moderate HK, Gr 1 DD, mild AI, mild RAE // Echo 8/18: Mild LVH, EF 30-35, diffuse HK, mild aortic stenosis (mean 7), mild AI, mild BAE  . Coronary artery calcification 08/18/2016   Cor CTA 1/08: Ca score 248 // LHC 1/08: oLAD 25%  . History of ETOH abuse   . Hyperlipidemia 08/27/2013  . Hypertension   . Non-ischemic cardiomyopathy (HCC)    Presumed secondary to Etoh and HTN  (Prev EF 20-25% -- improved to 50%) // LHC 1/08: oLAD 25, EF 25%   . OSA (obstructive sleep apnea) 10/17/2013   Severe per home sleep study performed on 09/26/13.      Social History   Socioeconomic History  . Marital status: Married    Spouse name: [name]  . Number of children: 3  . Years of education: Not on file  . Highest education level: Not on file  Social Needs  . Financial resource strain: Not on file  . Food insecurity - worry: Not on file  . Food insecurity - inability: Not on file  . Transportation needs - medical: Not on file  . Transportation needs - non-medical: Not on file  Occupational  History  . Occupation: Retired Radiographer, [employer]  Tobacco Use  . Smoking status: Former Smoker    Years: 20.00    Last attempt to quit: 04/12/2002    Years since quitting: 14.8  . Smokeless tobacco: Never Used  Substance and Sexual Activity  . Alcohol use: Yes    Alcohol/week: 4.2 oz    Types: 7 Glasses of wine per week    Comment: 1 glass of wine each night.    . Drug use: No    Comment: history of marijuana use  . Sexual activity: Yes  Other Topics Concern  . Not on file  Social History Narrative   2 sons and daughter   1 son in [name]. Living with wife 7656yrs Alan Bush([name]).  Use to live [place].    No past surgical history on file.  Family History  Problem Relation Age of Onset  . Alcohol abuse Mother   . Heart disease Mother   . Alcohol abuse Father   . Heart failure Father   . Heart disease Maternal Grandmother   . Stroke Maternal Grandmother   . Diabetes Neg Hx     Allergies  Allergen Reactions  . Lisinopril Swelling    ANGIOEDEMA    Current Outpatient Medications on File  Prior to Visit  Medication Sig Dispense Refill  . aspirin EC 81 MG tablet Take 1 tablet (81 mg total) by mouth daily. 90 tablet 3  . atorvastatin (LIPITOR) 10 MG tablet TAKE ONE TABLET BY MOUTH ONCE DAILY AT 6PM 180 tablet 3  . diphenhydrAMINE (BENADRYL) 25 MG tablet Take 50 mg by mouth every 6 (six) hours as needed.    . hydrALAZINE (APRESOLINE) 10 MG tablet Take 1 tablet (10 mg total) by mouth 3 (three) times daily. 90 tablet 11  . isosorbide mononitrate (IMDUR) 30 MG 24 hr tablet Take 1 tablet (30 mg total) by mouth daily. 30 tablet 11  . spironolactone (ALDACTONE) 25 MG tablet TAKE ONE-HALF TABLET BY MOUTH ONCE DAILY 90 tablet 3   No current facility-administered medications on file prior to visit.     BP 138/78 (BP Location: Right Arm, Cuff Size: Normal)   Pulse 65   Temp 98.2 F (36.8 C) (Oral)   Resp 16   Ht 5\' 10"  (1.778 m)   Wt 222 lb 6.4 oz (100.9 kg)   SpO2 98%   BMI 31.91 kg/m     Objective:   Physical Exam  Constitutional: He is oriented to person, place, and time. He appears well-developed and well-nourished. No distress.  HENT:  Head: Normocephalic and atraumatic.  Cardiovascular: Normal rate and regular rhythm.  No murmur heard. Pulmonary/Chest: Effort normal and breath sounds normal. No respiratory distress. He has no wheezes. He has no rales.  Musculoskeletal: He exhibits no edema.  Neurological: He is alert and oriented to person, place, and time.  Skin: Skin is warm and dry.  Psychiatric: He has a normal mood and affect. His behavior is normal. Thought content normal.          Assessment & Plan:  HTN- BP stable on current meds. Continue same, obtain follow up bmet.  Hyperglycemia- obtain follow up A1C.  Hyperlipidemia- tolerating statin- continue same. Obtain follow up lipid panel.   OSA- stable on cpap, continue same. Will request download report from his home dme company.

## 2017-02-16 NOTE — Telephone Encounter (Signed)
Could we please request a cpap download from his home DME company?

## 2017-02-16 NOTE — Telephone Encounter (Signed)
Download received and forwarded to PCP for review.  Please advise. 

## 2017-02-18 NOTE — Telephone Encounter (Signed)
Noted  

## 2017-04-17 ENCOUNTER — Other Ambulatory Visit: Payer: Self-pay | Admitting: Internal Medicine

## 2017-06-13 ENCOUNTER — Encounter: Payer: Self-pay | Admitting: Internal Medicine

## 2017-06-13 ENCOUNTER — Ambulatory Visit (INDEPENDENT_AMBULATORY_CARE_PROVIDER_SITE_OTHER): Payer: Medicare Other | Admitting: Internal Medicine

## 2017-06-13 VITALS — BP 130/68 | HR 77 | Ht 70.0 in | Wt 226.6 lb

## 2017-06-13 DIAGNOSIS — I10 Essential (primary) hypertension: Secondary | ICD-10-CM | POA: Diagnosis not present

## 2017-06-13 DIAGNOSIS — E785 Hyperlipidemia, unspecified: Secondary | ICD-10-CM | POA: Diagnosis not present

## 2017-06-13 DIAGNOSIS — I5022 Chronic systolic (congestive) heart failure: Secondary | ICD-10-CM | POA: Diagnosis not present

## 2017-06-13 DIAGNOSIS — I2510 Atherosclerotic heart disease of native coronary artery without angina pectoris: Secondary | ICD-10-CM | POA: Diagnosis not present

## 2017-06-13 DIAGNOSIS — I2584 Coronary atherosclerosis due to calcified coronary lesion: Secondary | ICD-10-CM

## 2017-06-13 LAB — CBC
HEMATOCRIT: 43.2 % (ref 37.5–51.0)
Hemoglobin: 15.4 g/dL (ref 13.0–17.7)
MCH: 29.3 pg (ref 26.6–33.0)
MCHC: 35.6 g/dL (ref 31.5–35.7)
MCV: 82 fL (ref 79–97)
Platelets: 141 10*3/uL — ABNORMAL LOW (ref 150–379)
RBC: 5.25 x10E6/uL (ref 4.14–5.80)
RDW: 14.7 % (ref 12.3–15.4)
WBC: 4.1 10*3/uL (ref 3.4–10.8)

## 2017-06-13 NOTE — Patient Instructions (Signed)
Your physician recommends that you continue on your current medications as directed. Please refer to the Current Medication list given to you today.  Your physician recommends that you return for lab work today (CBC).  Your physician wants you to follow-up in: October, 2019 with Dr. Tenny Craw.  You will receive a reminder letter in the mail two months in advance. If you don't receive a letter, please call our office to schedule the follow-up appointment.

## 2017-06-13 NOTE — Progress Notes (Signed)
Cardiology Office Note   Date:  06/13/2017   ID:  [name], DOB [birth year], MRN [id number]  PCP:  Sandford Craze, NP  Cardiologist:   Dietrich Pates, MD   F/U of [facility]     History of Present Illness: [name] is a 72 y.o. 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 aspirin EC 81 MG tablet Take 1 tablet (81 mg total) by mouth daily.  Marland Kitchen atorvastatin (LIPITOR) 10 MG tablet TAKE ONE TABLET BY MOUTH ONCE DAILY AT 6PM  . carvedilol (COREG) 25 MG tablet Take 1 tablet (25 mg total) 2 (two) times daily by mouth.  . diphenhydrAMINE (BENADRYL) 25 MG tablet Take 50 mg by mouth every 6 (six) hours as needed.  . furosemide (LASIX) 20 MG tablet Take 1 tablet (20 mg total) daily by mouth.  . hydrALAZINE (APRESOLINE) 10 MG tablet Take 1 tablet (10 mg total) by mouth 3 (three) times daily.  . isosorbide mononitrate (IMDUR) 30 MG 24 hr tablet TAKE 1 TABLET BY MOUTH ONCE DAILY  . potassium chloride (KLOR-CON M10) 10 MEQ tablet Take 1 tablet (10 mEq total) daily by mouth.  . spironolactone (ALDACTONE) 25 MG tablet TAKE ONE-HALF TABLET BY MOUTH ONCE DAILY     Allergies:   Lisinopril   Past Medical History:  Diagnosis Date  . Anomalous right coronary artery   . Chronic systolic CHF (congestive heart failure) (HCC) 08/18/2016   Echo 1/17:  Diff HK especially in inf wall, mild LVH, EF 30-35, mild AS (mean 10, peak 18) // Echo 11/15: Mild LVH, EF 30-35, moderate HK, Gr 1 DD, mild AI, mild RAE // Echo 8/18: Mild LVH, EF 30-35,  diffuse HK, mild aortic stenosis (mean 7), mild AI, mild BAE  . Coronary artery calcification 08/18/2016   Cor CTA 1/08: Ca score 248 // LHC 1/08: oLAD 25%  . History of ETOH abuse   . Hyperlipidemia 08/27/2013  . Hypertension   . Non-ischemic cardiomyopathy (HCC)    Presumed secondary to Etoh and HTN  (Prev EF 20-25% -- improved to 50%) // LHC 1/08: oLAD 25, EF 25%   . OSA (obstructive sleep apnea) 10/17/2013   Severe per home sleep study performed on 09/26/13.     History reviewed. No pertinent surgical history.   Social History:  The patient  reports that he quit smoking about 15 years ago. He quit after 20.00 years of use. he has never used smokeless tobacco. He reports that he drinks about 4.2 oz of alcohol per week. He reports that he does not use drugs.   Family History:  The patient's family history includes Alcohol abuse in his father and mother; Heart disease in his maternal grandmother and mother; Heart failure in his father; Stroke in his maternal grandmother.  ROS:  Please see the history of present illness. All other systems are reviewed and  Negative to the above problem except as noted.    PHYSICAL EXAM: VS:  BP 130/68   Pulse 77   Ht 5\' 10"  (1.778 m)   Wt 226 lb 9.6 oz (102.8 kg)   SpO2 94%   BMI 32.51 kg/m   GEN: Well nourished, well developed, in no acute distress  HEENT: normal  Neck: JVP normla  No carotid bruits, or masses Cardiac: RRR; no murmurs, rubs, or gallops,no edema  Respiratory:  clear to auscultation bilaterally, normal work of breathing GI: soft, nontender, nondistended, + BS  No hepatomegaly  MS: no deformity Moving all extremities   Skin: warm and dry, no rash Neuro:  Strength and sensation are intact Psych: euthymic mood, full affect   EKG:  EKG is not ordered today.     Lipid Panel    Component Value Date/Time   CHOL 124 02/16/2017 0830   TRIG 79.0 02/16/2017 0830   HDL 49.20 02/16/2017 0830   CHOLHDL 3 02/16/2017 0830   VLDL 15.8  02/16/2017 0830   LDLCALC 59 02/16/2017 0830      Wt Readings from Last 3 Encounters:  06/13/17 226 lb 9.6 oz (102.8 kg)  02/16/17 222 lb 6.4 oz (100.9 kg)  12/02/16 218 lb 3.2 oz (99 kg)      ASSESSMENT AND PLAN:  1  Chronic systolic CHF Volume status is good  He is Class I NYHA heart failure   Keep on same meds  We discussed ICD   He is doing well  He would like to reflect on this   2  Lipids   LDL in Nov was 59  Continue statin 3  HTN  BP at home in 110s   Continue meds    F/U in 8 months    Current medicines are reviewed at length with the patient today.  The patient does not have concerns regarding medicines.  Signed, Dietrich Pates, MD  06/13/2017 8:12 AM    [facility] [address] [place], [place], [place]  [address] Phone: [phone]; Fax: [phone]

## 2017-06-15 ENCOUNTER — Telehealth: Payer: Self-pay | Admitting: *Deleted

## 2017-06-15 NOTE — Telephone Encounter (Signed)
-----   Message from Loa Socks, LPN sent at 08/16/3147  8:36 AM EST -----   ----- Message ----- From: Pricilla Riffle, MD Sent: 06/13/2017   9:40 PM To: Lendon Ka, RN  CBC is normal   No new recommendations

## 2017-06-15 NOTE — Telephone Encounter (Signed)
LMOVM OF NORMAL RESULTS AND CLINIC CONTACT NUMBER FOR QUESTIONS 

## 2017-07-24 ENCOUNTER — Other Ambulatory Visit: Payer: Self-pay | Admitting: Internal Medicine

## 2017-07-26 NOTE — Telephone Encounter (Signed)
Outpatient Medication Detail    Disp Refills Start End   atorvastatin (LIPITOR) 10 MG tablet 180 tablet 3 06/03/2016    Sig: TAKE ONE TABLET BY MOUTH ONCE DAILY AT 6PM   Sent to pharmacy as: atorvastatin (LIPITOR) 10 MG tablet   Notes to Pharmacy: Please consider 90 day supplies to promote better adherence   E-Prescribing Status: Receipt confirmed by pharmacy (06/03/2016 10:11 AM EST)   Pharmacy   [phone] - [place], [place] - [address]

## 2017-08-12 ENCOUNTER — Ambulatory Visit: Payer: Medicare Other | Admitting: *Deleted

## 2017-08-16 ENCOUNTER — Ambulatory Visit: Payer: Medicare Other | Admitting: *Deleted

## 2017-08-17 ENCOUNTER — Ambulatory Visit: Payer: Medicare Other | Admitting: Family

## 2017-08-17 ENCOUNTER — Ambulatory Visit: Payer: Medicare Other | Admitting: *Deleted

## 2017-08-19 NOTE — Progress Notes (Signed)
Subjective:   [name] is a 72 y.o. male who presents for Medicare Annual/Subsequent preventive examination. 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  Review of Systems: No ROS.  Medicare Wellness Visit. Additional risk factors are reflected in the social history. Cardiac Risk Factors include: advanced age (>43men, >15 women);dyslipidemia;hypertension;male gender Sleep patterns: Wears CPAP. Sleeps 7-8 hrs. Home Safety/Smoke Alarms: Feels safe in home. Smoke alarms in place.  Living environment; residence and Firearm Safety: Lives with wife in 3 story home. No issues with stairs.  Male:   CCS-  cologuard ordered  PSA-  Lab Results  Component Value Date   PSA 1.04 10/23/2012   PSA 0.92 06/26/2009   PSA 0.78 01/19/2008       Objective:    Vitals: BP 138/70 (BP Location: Left Arm, Patient Position: Sitting, Cuff Size: Normal)   Pulse 71   Ht 5\' 10"  (1.778 m)   Wt 222 lb 12.8 oz (101.1 kg)   SpO2 97%   BMI 31.97 kg/m   Body mass index is 31.97 kg/m.  Advanced Directives 08/23/2017 08/11/2016 04/25/2016 06/20/2015 12/19/2013  Does Patient Have a Medical Advance Directive? No No No No No  Does patient want to make changes to medical advance directive? - Yes (MAU/Ambulatory/Procedural Areas - Information given) - - -  Would patient like information on creating a medical advance directive? No - Patient declined - - No - patient declined information Yes - Transport planner given    Tobacco Social History   Tobacco Use  Smoking Status Former Smoker  . Years: 20.00  . Last attempt to quit: 04/12/2002  . Years since quitting: 15.3  Smokeless Tobacco Never Used     Counseling given: Not Answered   Clinical Intake: Pain : No/denies pain   Past Medical History:  Diagnosis Date  . Anomalous right coronary artery   . Chronic systolic CHF (congestive heart failure) (HCC) 08/18/2016   Echo 1/17:  Diff HK especially in inf wall, mild LVH, EF 30-35, mild AS (mean 10, peak 18) // Echo  11/15: Mild LVH, EF 30-35, moderate HK, Gr 1 DD, mild AI, mild RAE // Echo 8/18: Mild LVH, EF 30-35, diffuse HK, mild aortic stenosis (mean 7), mild AI, mild BAE  . Coronary artery calcification 08/18/2016   Cor CTA 1/08: Ca score 248 // LHC 1/08: oLAD 25%  . History of ETOH abuse   . Hyperlipidemia 08/27/2013  . Hypertension   . Non-ischemic cardiomyopathy (HCC)    Presumed secondary to Etoh and HTN  (Prev EF 20-25% -- improved to 50%) // LHC 1/08: oLAD 25, EF 25%   . OSA (obstructive sleep apnea) 10/17/2013   Severe per home sleep study performed on 09/26/13.    History reviewed. No pertinent surgical history. Family History  Problem Relation Age of Onset  . Alcohol abuse Mother   . Heart disease Mother   . Alcohol abuse Father   . Heart failure Father   . Heart disease Maternal Grandmother   . Stroke Maternal Grandmother   . Diabetes Neg Hx    Social History   Socioeconomic History  . Marital status: Married    Spouse name: [name]  . Number of children: 3  . Years of education: Not on file  . Highest education level: Not on file  Occupational History  . Occupation: Retired Set designer  . Financial resource strain: Not on file  . Food insecurity:    Worry: Not on file    Inability:  Not on file  . Transportation needs:    Medical: Not on file    Non-medical: Not on file  Tobacco Use  . Smoking status: Former Smoker    Years: 20.00    Last attempt to quit: 04/12/2002    Years since quitting: 15.3  . Smokeless tobacco: Never Used  Substance and Sexual Activity  . Alcohol use: Yes    Alcohol/week: 4.2 oz    Types: 7 Glasses of wine per week    Comment: 1 glass of wine each night.    . Drug use: No    Comment: history of marijuana use  . Sexual activity: Yes  Lifestyle  . Physical activity:    Days per week: Not on file    Minutes per session: Not on file  . Stress: Not on file  Relationships  . Social connections:    Talks on phone: Not on file    Gets  together: Not on file    Attends religious service: Not on file    Active member of club or organization: Not on file    Attends meetings of clubs or organizations: Not on file    Relationship status: Not on file  Other Topics Concern  . Not on file  Social History Narrative   2 sons and daughter   1 son in [name]. Living with wife 27yrs [name]).  Use to live near [place].    Outpatient Encounter Medications as of 08/23/2017  Medication Sig  . aspirin EC 81 MG tablet Take 1 tablet (81 mg total) by mouth daily.  Marland Kitchen atorvastatin (LIPITOR) 10 MG tablet TAKE 1 TABLET BY MOUTH ONCE DAILY AT  6PM  . carvedilol (COREG) 25 MG tablet Take 1 tablet (25 mg total) 2 (two) times daily by mouth.  . diphenhydrAMINE (BENADRYL) 25 MG tablet Take 50 mg by mouth every 6 (six) hours as needed.  . hydrALAZINE (APRESOLINE) 10 MG tablet TAKE 1 TABLET BY MOUTH THREE TIMES DAILY  . isosorbide mononitrate (IMDUR) 30 MG 24 hr tablet TAKE 1 TABLET BY MOUTH ONCE DAILY  . potassium chloride (KLOR-CON M10) 10 MEQ tablet Take 1 tablet (10 mEq total) daily by mouth.  . spironolactone (ALDACTONE) 25 MG tablet TAKE 1/2 (ONE-HALF) TABLET BY MOUTH ONCE DAILY  . [DISCONTINUED] furosemide (LASIX) 20 MG tablet Take 1 tablet (20 mg total) daily by mouth.  . [DISCONTINUED] atorvastatin (LIPITOR) 10 MG tablet TAKE ONE TABLET BY MOUTH ONCE DAILY AT 6PM  . [DISCONTINUED] hydrALAZINE (APRESOLINE) 10 MG tablet Take 1 tablet (10 mg total) by mouth 3 (three) times daily.  . [DISCONTINUED] spironolactone (ALDACTONE) 25 MG tablet TAKE ONE-HALF TABLET BY MOUTH ONCE DAILY   No facility-administered encounter medications on file as of 08/23/2017.     Activities of Daily Living In your present state of health, do you have any difficulty performing the following activities: 08/23/2017  Hearing? N  Vision? N  Comment wearing bifocals. Eye exam yearly.  Difficulty concentrating or making decisions? N  Walking or climbing stairs? N    Dressing or bathing? N  Doing errands, shopping? N  Preparing Food and eating ? N  Using the Toilet? N  In the past six months, have you accidently leaked urine? N  Do you have problems with loss of bowel control? N  Managing your Medications? N  Managing your Finances? N  Housekeeping or managing your Housekeeping? N  Some recent data might be hidden    Patient Care Team: Sandford Craze, NP  as PCP - General (Internal Medicine) Pricilla Riffle, MD as Consulting Physician (Cardiology) Barron Alvine, MD as Consulting Physician (Urology)   Assessment:   This is a routine wellness examination for [facility]. Physical assessment deferred to PCP.  Exercise Activities and Dietary recommendations Current Exercise Habits: Home exercise routine, Type of exercise: walking, Time (Minutes): 30, Frequency (Times/Week): 3, Weekly Exercise (Minutes/Week): 90   Diet (meal preparation, eat out, water intake, caffeinated beverages, dairy products, fruits and vegetables): in general, a "healthy" diet  , well balanced   Goals    . Weight (lb) < 210 lb (95.3 kg)       Fall Risk Fall Risk  08/23/2017 08/11/2016 05/05/2016 06/20/2015 02/25/2014  Falls in the past year? No No No No No    Depression Screen PHQ 2/9 Scores 08/23/2017 08/11/2016 05/05/2016 02/02/2016  PHQ - 2 Score 0 0 0 0    Cognitive Function Ad8 score reviewed for issues:  Issues making decisions:no  Less interest in hobbies / activities:no  Repeats questions, stories (family complaining):no  Trouble using ordinary gadgets (microwave, computer, phone):no  Forgets the month or year: no  Mismanaging finances: no  Remembering appts:no  Daily problems with thinking and/or memory:no Ad8 score is=0  MMSE - Mini Mental State Exam 06/20/2015  Orientation to time 5  Orientation to Place 5  Registration 3  Attention/ Calculation 5  Recall 3  Language- name 2 objects 2  Language- repeat 1  Language- follow 3 step command 3   Language- read & follow direction 1  Write a sentence 1  Copy design 1  Total score 30        Immunization History  Administered Date(s) Administered  . Influenza Split 02/23/2011, 01/13/2012  . Influenza Whole 01/19/2008, 01/20/2009, 12/18/2009  . Influenza, High Dose Seasonal PF 02/02/2016, 02/02/2017  . Influenza,inj,Quad PF,6+ Mos 01/03/2013, 01/21/2014, 02/05/2015  . Pneumococcal Conjugate-13 01/21/2014, 02/05/2015  . Pneumococcal Polysaccharide-23 01/19/2008, 02/02/2016  . Td 01/29/2008  . Zoster 08/24/2011    Screening Tests Health Maintenance  Topic Date Due  . FOOT EXAM  01/16/1956  . OPHTHALMOLOGY EXAM  01/16/1957  . URINE MICROALBUMIN  01/16/1957  . COLONOSCOPY  05/21/2017  . HEMOGLOBIN A1C  08/16/2017  . INFLUENZA VACCINE  11/10/2017  . TETANUS/TDAP  01/28/2018  . Hepatitis C Screening  Completed  . PNA vac Low Risk Adult  Completed      Plan:   Follow up with PCP today as scheduled  Please schedule your next medicare wellness visit with me in 1 yr.  Continue to eat heart healthy diet (full of fruits, vegetables, whole grains, lean protein, water--limit salt, fat, and sugar intake) and increase physical activity as tolerated.  Continue doing brain stimulating activities (puzzles, reading, adult coloring books, staying active) to keep memory sharp.   Bring a copy of your living will and/or healthcare power of attorney to your next office visit.    I have personally reviewed and noted the following in the patient's chart:   . Medical and social history . Use of alcohol, tobacco or illicit drugs  . Current medications and supplements . Functional ability and status . Nutritional status . Physical activity . Advanced directives . List of other physicians . Hospitalizations, surgeries, and ER visits in previous 12 months . Vitals . Screenings to include cognitive, depression, and falls . Referrals and appointments  In addition, I have reviewed  and discussed with patient certain preventive protocols, quality metrics, and best practice recommendations. A written personalized  care plan for preventive services as well as general preventive health recommendations were provided to patient.     [place]on Gully, [place]  08/23/2017

## 2017-08-22 ENCOUNTER — Other Ambulatory Visit: Payer: Self-pay | Admitting: Family

## 2017-08-22 ENCOUNTER — Other Ambulatory Visit: Payer: Self-pay | Admitting: Internal Medicine

## 2017-08-23 ENCOUNTER — Encounter: Payer: Self-pay | Admitting: Family

## 2017-08-23 ENCOUNTER — Encounter: Payer: Self-pay | Admitting: *Deleted

## 2017-08-23 ENCOUNTER — Ambulatory Visit (INDEPENDENT_AMBULATORY_CARE_PROVIDER_SITE_OTHER): Payer: Medicare Other | Admitting: Family

## 2017-08-23 ENCOUNTER — Ambulatory Visit (INDEPENDENT_AMBULATORY_CARE_PROVIDER_SITE_OTHER): Payer: Medicare Other | Admitting: *Deleted

## 2017-08-23 VITALS — BP 138/70 | HR 71 | Ht 70.0 in | Wt 222.8 lb

## 2017-08-23 VITALS — BP 149/88 | HR 70 | Temp 98.0°F | Resp 16 | Ht 70.0 in | Wt 223.6 lb

## 2017-08-23 DIAGNOSIS — I10 Essential (primary) hypertension: Secondary | ICD-10-CM

## 2017-08-23 DIAGNOSIS — Z0000 Encounter for general adult medical examination without abnormal findings: Secondary | ICD-10-CM | POA: Diagnosis not present

## 2017-08-23 DIAGNOSIS — I2584 Coronary atherosclerosis due to calcified coronary lesion: Secondary | ICD-10-CM

## 2017-08-23 DIAGNOSIS — E785 Hyperlipidemia, unspecified: Secondary | ICD-10-CM | POA: Diagnosis not present

## 2017-08-23 DIAGNOSIS — I2510 Atherosclerotic heart disease of native coronary artery without angina pectoris: Secondary | ICD-10-CM | POA: Diagnosis not present

## 2017-08-23 DIAGNOSIS — Z1211 Encounter for screening for malignant neoplasm of colon: Secondary | ICD-10-CM

## 2017-08-23 DIAGNOSIS — R739 Hyperglycemia, unspecified: Secondary | ICD-10-CM

## 2017-08-23 DIAGNOSIS — I428 Other cardiomyopathies: Secondary | ICD-10-CM | POA: Diagnosis not present

## 2017-08-23 LAB — HEMOGLOBIN A1C: HEMOGLOBIN A1C: 6.4 % (ref 4.6–6.5)

## 2017-08-23 LAB — COMPREHENSIVE METABOLIC PANEL
ALBUMIN: 3.8 g/dL (ref 3.5–5.2)
ALK PHOS: 40 U/L (ref 39–117)
ALT: 26 U/L (ref 0–53)
AST: 18 U/L (ref 0–37)
BUN: 11 mg/dL (ref 6–23)
CHLORIDE: 105 meq/L (ref 96–112)
CO2: 30 mEq/L (ref 19–32)
CREATININE: 1.01 mg/dL (ref 0.40–1.50)
Calcium: 9.3 mg/dL (ref 8.4–10.5)
GFR: 93.76 mL/min (ref >60.00)
GLUCOSE: 97 mg/dL (ref 70–99)
Potassium: 4 mEq/L (ref 3.5–5.1)
SODIUM: 142 meq/L (ref 135–145)
TOTAL PROTEIN: 7 g/dL (ref 6.0–8.3)
Total Bilirubin: 0.6 mg/dL (ref 0.2–1.2)

## 2017-08-23 NOTE — Progress Notes (Signed)
Reviewed and agree.

## 2017-08-23 NOTE — Addendum Note (Signed)
Addended by: Mady Haagensen A on: 08/23/2017 11:21 AM   Modules accepted: Level of Service

## 2017-08-23 NOTE — Patient Instructions (Signed)
Please complete lab work prior to leaving.   

## 2017-08-23 NOTE — Progress Notes (Signed)
Subjective:    Patient ID: [name], male    DOB: 1945-06-04, 72 y.o.   MRN: [id number]  HPI   Pt is a 72 yr old male who presents today for follow up.  1) Hyperlipidemia- continues lipitor and low cholesterol diet.  Denies myalgia. Reports that [facility] [employer], he walks 2 miles every day  Lab Results  Component Value Date   CHOL 124 02/16/2017   HDL 49.20 02/16/2017   LDLCALC 59 02/16/2017   TRIG 79.0 02/16/2017   CHOLHDL 3 02/16/2017   2) HTN- continues carvedilol, kdur, aldactone, lasix.  Denies CP/SOB/LE edema.  BP Readings from Last 3 Encounters:  08/23/17 (!) 149/88  08/23/17 138/70  06/13/17 130/68   2) OSA- maintained on cpap.    Wt Readings from Last 3 Encounters:  08/23/17 223 lb 9.6 oz (101.4 kg)  08/23/17 222 lb 12.8 oz (101.1 kg)  06/13/17 226 lb 9.6 oz (102.8 kg)    Lab Results  Component Value Date   HGBA1C 6.0 02/16/2017      Review of Systems See HPI  Past Medical History:  Diagnosis Date  . Anomalous right coronary artery   . Chronic systolic CHF (congestive heart failure) (HCC) 08/18/2016   Echo 1/17:  Diff HK especially in inf wall, mild LVH, EF 30-35, mild AS (mean 10, peak 18) // Echo 11/15: Mild LVH, EF 30-35, moderate HK, Gr 1 DD, mild AI, mild RAE // Echo 8/18: Mild LVH, EF 30-35, diffuse HK, mild aortic stenosis (mean 7), mild AI, mild BAE  . Coronary artery calcification 08/18/2016   Cor CTA 1/08: Ca score 248 // LHC 1/08: oLAD 25%  . History of ETOH abuse   . Hyperlipidemia 08/27/2013  . Hypertension   . Non-ischemic cardiomyopathy (HCC)    Presumed secondary to Etoh and HTN  (Prev EF 20-25% -- improved to 50%) // LHC 1/08: oLAD 25, EF 25%   . OSA (obstructive sleep apnea) 10/17/2013   Severe per home sleep study performed on 09/26/13.      Social History   Socioeconomic History  . Marital status: Married    Spouse name: [name]  . Number of children: 3  . Years of education: Not on file  . Highest education level: Not on  file  Occupational History  . Occupation: Retired Set designer  . Financial resource strain: Not on file  . Food insecurity:    Worry: Not on file    Inability: Not on file  . Transportation needs:    Medical: Not on file    Non-medical: Not on file  Tobacco Use  . Smoking status: Former Smoker    Years: 20.00    Last attempt to quit: 04/12/2002    Years since quitting: 15.3  . Smokeless tobacco: Never Used  Substance and Sexual Activity  . Alcohol use: Yes    Alcohol/week: 4.2 oz    Types: 7 Glasses of wine per week    Comment: 1 glass of wine each night.    . Drug use: No    Comment: history of marijuana use  . Sexual activity: Yes  Lifestyle  . Physical activity:    Days per week: Not on file    Minutes per session: Not on file  . Stress: Not on file  Relationships  . Social connections:    Talks on phone: Not on file    Gets together: Not on file    Attends religious service: Not  on file    Active member of club or organization: Not on file    Attends meetings of clubs or organizations: Not on file    Relationship status: Not on file  . Intimate partner violence:    Fear of current or ex partner: Not on file    Emotionally abused: Not on file    Physically abused: Not on file    Forced sexual activity: Not on file  Other Topics Concern  . Not on file  Social History Narrative   2 sons and daughter   1 son in [name]. Living with wife 76yrs [name]).  Use to live near [place].    No past surgical history on file.  Family History  Problem Relation Age of Onset  . Alcohol abuse Mother   . Heart disease Mother   . Alcohol abuse Father   . Heart failure Father   . Heart disease Maternal Grandmother   . Stroke Maternal Grandmother   . Diabetes Neg Hx     Allergies  Allergen Reactions  . Lisinopril Swelling    ANGIOEDEMA    Current Outpatient Medications on File Prior to Visit  Medication Sig Dispense Refill  . aspirin EC 81 MG tablet Take 1  tablet (81 mg total) by mouth daily. 90 tablet 3  . atorvastatin (LIPITOR) 10 MG tablet TAKE 1 TABLET BY MOUTH ONCE DAILY AT  6PM 90 tablet 3  . carvedilol (COREG) 25 MG tablet Take 1 tablet (25 mg total) 2 (two) times daily by mouth. 180 tablet 1  . diphenhydrAMINE (BENADRYL) 25 MG tablet Take 50 mg by mouth every 6 (six) hours as needed.    . furosemide (LASIX) 20 MG tablet TAKE 1 TABLET BY MOUTH ONCE DAILY 90 tablet 1  . hydrALAZINE (APRESOLINE) 10 MG tablet TAKE 1 TABLET BY MOUTH THREE TIMES DAILY 270 tablet 3  . isosorbide mononitrate (IMDUR) 30 MG 24 hr tablet TAKE 1 TABLET BY MOUTH ONCE DAILY 90 tablet 1  . potassium chloride (KLOR-CON M10) 10 MEQ tablet Take 1 tablet (10 mEq total) daily by mouth. 90 tablet 1  . spironolactone (ALDACTONE) 25 MG tablet TAKE 1/2 (ONE-HALF) TABLET BY MOUTH ONCE DAILY 45 tablet 3   No current facility-administered medications on file prior to visit.     BP (!) 149/88 (BP Location: Right Arm, Patient Position: Sitting, Cuff Size: Large)   Pulse 70   Temp 98 F (36.7 C) (Oral)   Resp 16   Ht 5\' 10"  (1.778 m)   Wt 223 lb 9.6 oz (101.4 kg)   SpO2 99%   BMI 32.08 kg/m       Objective:   Physical Exam  Constitutional: He is oriented to person, place, and time. He appears well-developed and well-nourished. No distress.  HENT:  Head: Normocephalic and atraumatic.  Cardiovascular: Normal rate and regular rhythm.  No murmur heard. Pulmonary/Chest: Effort normal and breath sounds normal. No respiratory distress. He has no wheezes. He has no rales.  Musculoskeletal: He exhibits no edema.  Neurological: He is alert and oriented to person, place, and time.  Skin: Skin is warm and dry.  Psychiatric: He has a normal mood and affect. His behavior is normal. Thought content normal.          Assessment & Plan:  HTN- bp acceptable for age on current meds, continue same.  Hyperglycemia- clinically stable, check A1C.   Hyperlipidemia- tolerating  statin, continue same.   Hx of NICM- appears euvolemic/asymptomatic.

## 2017-08-23 NOTE — Patient Instructions (Signed)
Follow up with PCP today as scheduled  Please schedule your next medicare wellness visit with me in 1 yr.  Continue to eat heart healthy diet (full of fruits, vegetables, whole grains, lean protein, water--limit salt, fat, and sugar intake) and increase physical activity as tolerated.  Continue doing brain stimulating activities (puzzles, reading, adult coloring books, staying active) to keep memory sharp.   Bring a copy of your living will and/or healthcare power of attorney to your next office visit.   [name] , Thank you for taking time to come for your Medicare Wellness Visit. I appreciate your ongoing commitment to your health goals. Please review the following plan we discussed and let me know if I can assist you in the future.   These are the goals we discussed: Goals    . Weight (lb) < 210 lb (95.3 kg)       This is a list of the screening recommended for you and due dates:  Health Maintenance  Topic Date Due  . Complete foot exam   01/16/1957  . Eye exam for diabetics  01/16/1957  . Urine Protein Check  01/16/1957  . Colon Cancer Screening  05/21/2017  . Hemoglobin A1C  08/16/2017  . Flu Shot  11/10/2017  . Tetanus Vaccine  01/28/2018  .  Hepatitis C: One time screening is recommended by Center for Disease Control  (CDC) for  adults born from [birth year] through 1965.   Completed  . Pneumonia vaccines  Completed

## 2017-09-19 ENCOUNTER — Encounter: Payer: Self-pay | Admitting: Family

## 2017-09-19 DIAGNOSIS — Z1211 Encounter for screening for malignant neoplasm of colon: Secondary | ICD-10-CM | POA: Diagnosis not present

## 2017-09-19 LAB — COLOGUARD

## 2017-09-19 LAB — HM COLONOSCOPY

## 2017-10-03 ENCOUNTER — Telehealth: Payer: Self-pay | Admitting: *Deleted

## 2017-10-03 ENCOUNTER — Other Ambulatory Visit: Payer: Self-pay | Admitting: Family

## 2017-10-03 NOTE — Telephone Encounter (Signed)
Received Cologuard Results; forwarded to provider/SLS 06/24

## 2017-10-31 ENCOUNTER — Other Ambulatory Visit: Payer: Self-pay | Admitting: Internal Medicine

## 2017-12-29 ENCOUNTER — Encounter: Payer: Self-pay | Admitting: Physician Assistant

## 2018-01-09 NOTE — Progress Notes (Signed)
Cardiology Office Note:    Date:  01/10/2018   ID:  [name], DOB January 06, 1946, MRN [id number]  PCP:  Alan Craze, NP  Cardiologist:  Dietrich Pates, MD  Electrophysiologist:  None   Referring MD: Alan Craze, NP   Chief Complaint  Patient presents with  . Follow-up    CHF    History of Present Illness:    [name] is a 72 y.o. male with systolic heart failure in the setting of nonischemic cardiomyopathy, mild aortic stenosis, ACE inhibitor allergy, prior alcohol abuse, HTN, sleep apnea. Calcium score by coronary CTA in 1/08 was 248. However, cardiac catheterization demonstrated minimal coronary plaque.  Last seen by Dr. Tenny Francis in 06/2017.    [name] returns for follow-up.  He is here alone.  Since last seen, he has started to follow the [employer].  He has lost about 20 pounds.  He is feeling much better.  He he denies shortness of breath.  He has not had chest discomfort, paroxysmal nocturnal dyspnea or lower extremity swelling.  He has not had dizziness or syncope.  Prior CV studies:   The following studies were reviewed today:  Echo 11/11/16 Mild LVH, EF 30-35, diff HK, mild AS (mean 7), mild AI, mild BAE  Echo 1/17 Diffuse HK especially in inferior wall, mild LVH, EF 30-35, mild aortic stenosis (mean 10, peak 18)  Echo 11/15 Mild LVH, EF 30-35, moderate HK, grade 1 diastolic dysfunction, mild AI, mild RAE  AAA [place] 7/14 Normal size abdominal aorta  LHC 1/08 LAD ostial 25 EF 25  Coronary CTA 1/08 FINAL IMPRESSION: 1. Anomalous right coronary artery coming off the left main without critical stenosis. However, the artery does course between the pulmonary and aortic valve. 2. Less than 50% calcific plaque in the proximal LAD with no critical stenoses in the right coronary artery and circumflex. 3. Global hypokinesis. Quantitative ejection fraction 30%. 4. Calcium score 248 primarily consisting of a plaque in the proximal LAD and mid  circumflex. 5. Bihilar adenopathy and 2 left lower lobe lung nodules. The former finding can be seen with congestive failure. Correlate with any history of malignacy. Consider follow-up with dedicated chest CT when patient is clinically improved/ as outpatient. 2-3 month interval follow-up would be ideal.   Past Medical History:  Diagnosis Date  . Anomalous right coronary artery   . Chronic systolic CHF (congestive heart failure) (HCC) 08/18/2016   Echo 1/17:  Diff HK especially in inf wall, mild LVH, EF 30-35, mild AS (mean 10, peak 18) // Echo 11/15: Mild LVH, EF 30-35, moderate HK, Gr 1 DD, mild AI, mild RAE // Echo 8/18: Mild LVH, EF 30-35, diffuse HK, mild aortic stenosis (mean 7), mild AI, mild BAE  . Coronary artery calcification 08/18/2016   Cor CTA 1/08: Ca score 248 // LHC 1/08: oLAD 25%  . History of ETOH abuse   . Hyperlipidemia 08/27/2013  . Hypertension   . Non-ischemic cardiomyopathy (HCC)    Presumed secondary to Etoh and HTN  (Prev EF 20-25% -- improved to 50%) // LHC 1/08: oLAD 25, EF 25%   . OSA (obstructive sleep apnea) 10/17/2013   Severe per home sleep study performed on 09/26/13.    Surgical Hx: The patient  has no past surgical history on file.   Current Medications: Current Meds  Medication Sig  . aspirin EC 81 MG tablet Take 1 tablet (81 mg total) by mouth daily.  Marland Kitchen atorvastatin (LIPITOR) 10 MG tablet  TAKE 1 TABLET BY MOUTH ONCE DAILY AT  6PM  . carvedilol (COREG) 25 MG tablet Take 1 tablet (25 mg total) 2 (two) times daily by mouth.  . diphenhydrAMINE (BENADRYL) 25 MG tablet Take 50 mg by mouth every 6 (six) hours as needed.  . furosemide (LASIX) 20 MG tablet Take 1 tablet (20 mg total) by mouth daily.  . hydrALAZINE (APRESOLINE) 10 MG tablet TAKE 1 TABLET BY MOUTH THREE TIMES DAILY  . isosorbide mononitrate (IMDUR) 30 MG 24 hr tablet Take 1 tablet (30 mg total) by mouth daily.  Marland Kitchen KLOR-CON M10 10 MEQ tablet TAKE 1 TABLET BY MOUTH ONCE DAILY  .  spironolactone (ALDACTONE) 25 MG tablet TAKE 1/2 (ONE-HALF) TABLET BY MOUTH ONCE DAILY  . [DISCONTINUED] furosemide (LASIX) 20 MG tablet TAKE 1 TABLET BY MOUTH ONCE DAILY  . [DISCONTINUED] isosorbide mononitrate (IMDUR) 30 MG 24 hr tablet TAKE 1 TABLET BY MOUTH ONCE DAILY     Allergies:   Lisinopril   Social History   Tobacco Use  . Smoking status: Former Smoker    Years: 20.00    Last attempt to quit: 04/12/2002    Years since quitting: 15.7  . Smokeless tobacco: Never Used  Substance Use Topics  . Alcohol use: Yes    Alcohol/week: 7.0 standard drinks    Types: 7 Glasses of wine per week    Comment: 1 glass of wine each night.    . Drug use: No    Comment: history of marijuana use     Family Hx: The patient's family history includes Alcohol abuse in his father and mother; Heart disease in his maternal grandmother and mother; Heart failure in his father; Stroke in his maternal grandmother. There is no history of Diabetes.  ROS:   Please see the history of present illness.    ROS All other systems reviewed and are negative.   EKGs/Labs/Other Test Reviewed:    EKG:  EKG is  ordered today.  The ekg ordered today demonstrates normal sinus rhythm, heart rate 75, left axis deviation, T wave inversions 1, aVL, V4-V6, QTC 444, similar to old EKGs  Recent Labs: 06/13/2017: Hemoglobin 15.4; Platelets 141 08/23/2017: ALT 26; BUN 11; Creatinine, Ser 1.01; Potassium 4.0; Sodium 142   Recent Lipid Panel Lab Results  Component Value Date/Time   CHOL 124 02/16/2017 08:30 AM   TRIG 79.0 02/16/2017 08:30 AM   HDL 49.20 02/16/2017 08:30 AM   CHOLHDL 3 02/16/2017 08:30 AM   LDLCALC 59 02/16/2017 08:30 AM    Physical Exam:    VS:  BP 110/72   Pulse 75   Ht 5\' 10"  (1.778 m)   Wt 203 lb 12.8 oz (92.4 kg)   SpO2 95%   BMI 29.24 kg/m     Wt Readings from Last 3 Encounters:  01/10/18 203 lb 12.8 oz (92.4 kg)  08/23/17 223 lb 9.6 oz (101.4 kg)  08/23/17 222 lb 12.8 oz (101.1 kg)      Physical Exam  Constitutional: He is oriented to person, place, and time. He appears well-developed and well-nourished. No distress.  HENT:  Head: Normocephalic and atraumatic.  Eyes: No scleral icterus.  Neck: No JVD present.  Cardiovascular: Normal rate and regular rhythm.  No murmur heard. Pulmonary/Chest: Effort normal. He has no rales.  Abdominal: Soft. He exhibits no distension.  Musculoskeletal: He exhibits no edema.  Neurological: He is alert and oriented to person, place, and time.  Skin: Skin is warm and dry.  ASSESSMENT & PLAN:    Chronic systolic CHF (congestive heart failure) (HCC)  EF 30-35.  NYHA 1.  He is not on ACE inhibitor/ARB or Entresto secondary to ACE inhibitor allergy.  He is currently doing well without evidence of volume excess.  Continue current dose of beta-blocker, hydralazine, nitrates, Spironolactone.  We discussed referral to electrophysiology to discuss ICD.  He would like to hold off on this for now.  Consider repeating echocardiogram after next visit to recheck ejection fraction.  Essential hypertension  The patient's blood pressure is controlled on his current regimen.  Continue current therapy.  As noted, he has lost about 20 pounds.  His goal is 160 pounds.  I have congratulated him today on losing weight and encouraged him to keep up with this program.   Dispo:  Return in about 9 months (around 10/11/2018) for Routine Follow Up, w/ Dr. Tenny Francis, or Tereso Newcomer, PA-C.   Medication Adjustments/Labs and Tests Ordered: Current medicines are reviewed at length with the patient today.  Concerns regarding medicines are outlined above.  Tests Ordered: Orders Placed This Encounter  Procedures  . EKG 12-Lead   Medication Changes: Meds ordered this encounter  Medications  . furosemide (LASIX) 20 MG tablet    Sig: Take 1 tablet (20 mg total) by mouth daily.    Dispense:  90 tablet    Refill:  3  . isosorbide mononitrate (IMDUR) 30 MG 24 hr tablet     Sig: Take 1 tablet (30 mg total) by mouth daily.    Dispense:  90 tablet    Refill:  3    Signed, Tereso Newcomer, PA-C  01/10/2018 10:06 AM    [facility] [address] [place], [place], [place]  [address] Phone: [phone]; Fax: [phone]

## 2018-01-10 ENCOUNTER — Ambulatory Visit (INDEPENDENT_AMBULATORY_CARE_PROVIDER_SITE_OTHER): Payer: Medicare Other | Admitting: Physician Assistant

## 2018-01-10 ENCOUNTER — Encounter: Payer: Self-pay | Admitting: Physician Assistant

## 2018-01-10 VITALS — BP 110/72 | HR 75 | Ht 70.0 in | Wt 203.8 lb

## 2018-01-10 DIAGNOSIS — I2510 Atherosclerotic heart disease of native coronary artery without angina pectoris: Secondary | ICD-10-CM

## 2018-01-10 DIAGNOSIS — I10 Essential (primary) hypertension: Secondary | ICD-10-CM | POA: Diagnosis not present

## 2018-01-10 DIAGNOSIS — I5022 Chronic systolic (congestive) heart failure: Secondary | ICD-10-CM

## 2018-01-10 DIAGNOSIS — I2584 Coronary atherosclerosis due to calcified coronary lesion: Secondary | ICD-10-CM | POA: Diagnosis not present

## 2018-01-10 DIAGNOSIS — Z23 Encounter for immunization: Secondary | ICD-10-CM | POA: Diagnosis not present

## 2018-01-10 MED ORDER — FUROSEMIDE 20 MG PO TABS: 20.0000 mg | ORAL_TABLET | Freq: Every day | ORAL | 3 refills | Status: DC

## 2018-01-10 MED ORDER — ISOSORBIDE MONONITRATE ER 30 MG PO TB24: 30.0000 mg | ORAL_TABLET | Freq: Every day | ORAL | 3 refills | Status: DC

## 2018-01-10 NOTE — Patient Instructions (Signed)
Medication Instructions:   Your physician recommends that you continue on your current medications as directed. Please refer to the Current Medication list given to you today.   If you need a refill on your cardiac medications before your next appointment, please call your pharmacy.  Labwork: NONE ORDERED  TODAY    Testing/Procedures: NONE ORDERED  TODAY    Follow-Up:  8 TO 9 MONTHS WITH DR ROSS OR WEAVER    Any Other Special Instructions Will Be Listed Below (If Applicable).

## 2018-01-24 ENCOUNTER — Other Ambulatory Visit: Payer: Self-pay | Admitting: Family

## 2018-02-28 ENCOUNTER — Ambulatory Visit (INDEPENDENT_AMBULATORY_CARE_PROVIDER_SITE_OTHER): Payer: Medicare Other | Admitting: Family

## 2018-02-28 VITALS — BP 126/64 | HR 72 | Temp 98.6°F | Resp 16 | Ht 69.0 in | Wt 203.0 lb

## 2018-02-28 DIAGNOSIS — G4733 Obstructive sleep apnea (adult) (pediatric): Secondary | ICD-10-CM

## 2018-02-28 DIAGNOSIS — I2510 Atherosclerotic heart disease of native coronary artery without angina pectoris: Secondary | ICD-10-CM

## 2018-02-28 DIAGNOSIS — I10 Essential (primary) hypertension: Secondary | ICD-10-CM | POA: Diagnosis not present

## 2018-02-28 DIAGNOSIS — Z125 Encounter for screening for malignant neoplasm of prostate: Secondary | ICD-10-CM

## 2018-02-28 DIAGNOSIS — I5022 Chronic systolic (congestive) heart failure: Secondary | ICD-10-CM | POA: Diagnosis not present

## 2018-02-28 DIAGNOSIS — I2584 Coronary atherosclerosis due to calcified coronary lesion: Secondary | ICD-10-CM | POA: Diagnosis not present

## 2018-02-28 DIAGNOSIS — E785 Hyperlipidemia, unspecified: Secondary | ICD-10-CM

## 2018-02-28 LAB — LIPID PANEL
Cholesterol: 136 mg/dL (ref 0–200)
HDL: 61.5 mg/dL (ref >39.00)
LDL Cholesterol: 58 mg/dL (ref 0–99)
NonHDL: 74.45
Total CHOL/HDL Ratio: 2
Triglycerides: 80 mg/dL (ref 0.0–149.0)
VLDL: 16 mg/dL (ref 0.0–40.0)

## 2018-02-28 LAB — COMPREHENSIVE METABOLIC PANEL
ALK PHOS: 41 U/L (ref 39–117)
ALT: 14 U/L (ref 0–53)
AST: 14 U/L (ref 0–37)
Albumin: 4.1 g/dL (ref 3.5–5.2)
BILIRUBIN TOTAL: 0.5 mg/dL (ref 0.2–1.2)
BUN: 10 mg/dL (ref 6–23)
CO2: 29 mEq/L (ref 19–32)
Calcium: 9.2 mg/dL (ref 8.4–10.5)
Chloride: 104 mEq/L (ref 96–112)
Creatinine, Ser: 0.98 mg/dL (ref 0.40–1.50)
GFR: 96.93 mL/min (ref >60.00)
GLUCOSE: 94 mg/dL (ref 70–99)
Potassium: 4.4 mEq/L (ref 3.5–5.1)
SODIUM: 141 meq/L (ref 135–145)
TOTAL PROTEIN: 6.7 g/dL (ref 6.0–8.3)

## 2018-02-28 LAB — PSA, MEDICARE: PSA: 0.82 ng/mL (ref 0.10–4.00)

## 2018-02-28 NOTE — Progress Notes (Signed)
Subjective:    Patient ID: [name], male    DOB: Mar 22, 1946, 72 y.o.   MRN: [id number]  HPI  Patient is a 72 yr old male who presents today for follow up.  HTN- BP medications include aldactone, furosemide, hydralazine, coreg.  Denies CP/SOB or swelling.  BP Readings from Last 3 Encounters:  02/28/18 126/64  01/10/18 110/72  08/23/17 (!) 149/88   Reports that he has been doing [employer]. Walks at [employer].  Wt Readings from Last 3 Encounters:  02/28/18 203 lb (92.1 kg)  01/10/18 203 lb 12.8 oz (92.4 kg)  08/23/17 223 lb 9.6 oz (101.4 kg)    Hyperlipidemia- maintained on atorvastatin.  Lab Results  Component Value Date   CHOL 124 02/16/2017   HDL 49.20 02/16/2017   LDLCALC 59 02/16/2017   TRIG 79.0 02/16/2017   CHOLHDL 3 02/16/2017   Chronic systolic CHF- patient is followed by cardiology- Dr. Tenny Craw.  Last visit was 06/13/17.     OSA- continues cpap.  Reports that he sleeps well. Wakes up rested.    Notes that he had some urgency which has resolved with weight loss.   Lab Results  Component Value Date   PSA 1.04 10/23/2012   PSA 0.92 06/26/2009   PSA 0.78 01/19/2008     Review of Systems See HPI  Past Medical History:  Diagnosis Date  . Anomalous right coronary artery   . Chronic systolic CHF (congestive heart failure) (HCC) 08/18/2016   Echo 1/17:  Diff HK especially in inf wall, mild LVH, EF 30-35, mild AS (mean 10, peak 18) // Echo 11/15: Mild LVH, EF 30-35, moderate HK, Gr 1 DD, mild AI, mild RAE // Echo 8/18: Mild LVH, EF 30-35, diffuse HK, mild aortic stenosis (mean 7), mild AI, mild BAE  . Coronary artery calcification 08/18/2016   Cor CTA 1/08: Ca score 248 // LHC 1/08: oLAD 25%  . History of ETOH abuse   . Hyperlipidemia 08/27/2013  . Hypertension   . Non-ischemic cardiomyopathy (HCC)    Presumed secondary to Etoh and HTN  (Prev EF 20-25% -- improved to 50%) // LHC 1/08: oLAD 25, EF 25%   . OSA (obstructive sleep apnea) 10/17/2013   Severe per  home sleep study performed on 09/26/13.      Social History   Socioeconomic History  . Marital status: Married    Spouse name: [name]  . Number of children: 3  . Years of education: Not on file  . Highest education level: Not on file  Occupational History  . Occupation: Retired Set designer  . Financial resource strain: Not on file  . Food insecurity:    Worry: Not on file    Inability: Not on file  . Transportation needs:    Medical: Not on file    Non-medical: Not on file  Tobacco Use  . Smoking status: Former Smoker    Years: 20.00    Last attempt to quit: 04/12/2002    Years since quitting: 15.8  . Smokeless tobacco: Never Used  Substance and Sexual Activity  . Alcohol use: Yes    Alcohol/week: 7.0 standard drinks    Types: 7 Glasses of wine per week    Comment: 1 glass of wine each night.    . Drug use: No    Comment: history of marijuana use  . Sexual activity: Yes  Lifestyle  . Physical activity:    Days per week: Not on file  Minutes per session: Not on file  . Stress: Not on file  Relationships  . Social connections:    Talks on phone: Not on file    Gets together: Not on file    Attends religious service: Not on file    Active member of club or organization: Not on file    Attends meetings of clubs or organizations: Not on file    Relationship status: Not on file  . Intimate partner violence:    Fear of current or ex partner: Not on file    Emotionally abused: Not on file    Physically abused: Not on file    Forced sexual activity: Not on file  Other Topics Concern  . Not on file  Social History Narrative   2 sons and daughter   1 son in [name]. Living with wife 66yrs [name]).  Use to live near [place].    No past surgical history on file.  Family History  Problem Relation Age of Onset  . Alcohol abuse Mother   . Heart disease Mother   . Alcohol abuse Father   . Heart failure Father   . Heart disease Maternal Grandmother   . Stroke  Maternal Grandmother   . Diabetes Neg Hx     Allergies  Allergen Reactions  . Lisinopril Swelling    ANGIOEDEMA    Current Outpatient Medications on File Prior to Visit  Medication Sig Dispense Refill  . aspirin EC 81 MG tablet Take 1 tablet (81 mg total) by mouth daily. 90 tablet 3  . atorvastatin (LIPITOR) 10 MG tablet TAKE 1 TABLET BY MOUTH ONCE DAILY AT  6PM 90 tablet 3  . carvedilol (COREG) 25 MG tablet TAKE 1 TABLET BY MOUTH TWICE DAILY 180 tablet 1  . diphenhydrAMINE (BENADRYL) 25 MG tablet Take 50 mg by mouth every 6 (six) hours as needed.    . furosemide (LASIX) 20 MG tablet Take 1 tablet (20 mg total) by mouth daily. 90 tablet 3  . hydrALAZINE (APRESOLINE) 10 MG tablet TAKE 1 TABLET BY MOUTH THREE TIMES DAILY 270 tablet 3  . isosorbide mononitrate (IMDUR) 30 MG 24 hr tablet Take 1 tablet (30 mg total) by mouth daily. 90 tablet 3  . KLOR-CON M10 10 MEQ tablet TAKE 1 TABLET BY MOUTH ONCE DAILY 90 tablet 1  . spironolactone (ALDACTONE) 25 MG tablet TAKE 1/2 (ONE-HALF) TABLET BY MOUTH ONCE DAILY 45 tablet 3   No current facility-administered medications on file prior to visit.     BP 126/64 (BP Location: Right Arm, Patient Position: Sitting, Cuff Size: Small)   Pulse 72   Temp 98.6 F (37 C) (Oral)   Resp 16   Ht 5\' 9"  (1.753 m)   Wt 203 lb (92.1 kg)   BMI 29.98 kg/m       Objective:   Physical Exam  Constitutional: He is oriented to person, place, and time. He appears well-developed and well-nourished. No distress.  HENT:  Head: Normocephalic and atraumatic.  Cardiovascular: Normal rate and regular rhythm.  No murmur heard. Pulmonary/Chest: Effort normal and breath sounds normal. No respiratory distress. He has no wheezes. He has no rales.  Musculoskeletal: Normal range of motion. He exhibits no edema.  Neurological: He is alert and oriented to person, place, and time.  Skin: Skin is warm and dry.  Psychiatric: He has a normal mood and affect. His behavior is  normal. Thought content normal.          Assessment &  Plan:  Hyperlipidemia- obtain follow up lipid panel. Continue statin.  HTN- BP looks great. Continue current medications, obtain follow up cmet.  CHF- appears euvolemic. Continue current dose of lasix.  OSA- reports good compliance. I have asked him to call us back with the name of his DME company so I can request a download report.

## 2018-02-28 NOTE — Patient Instructions (Signed)
Please complete lab work prior to leaving. Keep up the good work with healthy diet, exercise and weight loss.  

## 2018-04-19 ENCOUNTER — Other Ambulatory Visit: Payer: Self-pay | Admitting: Family

## 2018-07-25 ENCOUNTER — Other Ambulatory Visit: Payer: Self-pay | Admitting: Family

## 2018-07-25 ENCOUNTER — Other Ambulatory Visit: Payer: Self-pay | Admitting: Internal Medicine

## 2018-08-28 ENCOUNTER — Ambulatory Visit: Payer: Medicare Other | Admitting: *Deleted

## 2018-08-28 NOTE — Progress Notes (Signed)
Virtual Visit via Video Note  I connected with patient on 08/29/18 at  8:00 AM EDT by a video enabled telemedicine application and verified that I am speaking with the correct person using two identifiers.   THIS ENCOUNTER IS A VIRTUAL VISIT DUE TO COVID-19 - PATIENT WAS NOT SEEN IN THE OFFICE. PATIENT HAS CONSENTED TO VIRTUAL VISIT / TELEMEDICINE VISIT   Location of patient: home  Location of provider: office  I discussed the limitations of evaluation and management by telemedicine and the availability of in person appointments. The patient expressed understanding and agreed to proceed.   Subjective:   [name] is a 73 y.o. male who presents for Medicare Annual/Subsequent preventive examination.  Still works full time driving for [employer]. Using mask, gloves, and hand sanitizer at this time. Review of Systems: No ROS.  Medicare Wellness Virtual Visit.  Visual/audio telehealth visit, UTA vital signs.   See social history for additional risk factors.  Cardiac Risk Factors include: advanced age (>56men, >40 women);hypertension;dyslipidemia;male gender Sleep patterns: Wears CPAP.Sleeps very well. Home Safety/Smoke Alarms: Feels safe in home. Smoke alarms in place.  Lives with wife in 3 story home. No trouble navigating stairs.  Eye-Yearly. UTD per pt.  Male:   CCS-  Cologuard 09/19/17. Negative.   PSA-  Lab Results  Component Value Date   PSA 0.82 02/28/2018   PSA 1.04 10/23/2012   PSA 0.92 06/26/2009       Objective:    Vitals: BP 113/75 Comment: pt reports  Pulse 68 Comment: pt reports    Advanced Directives 08/29/2018 08/23/2017 08/11/2016 04/25/2016 06/20/2015 12/19/2013  Does Patient Have a Medical Advance Directive? No No No No No No  Does patient want to make changes to medical advance directive? - - Yes (MAU/Ambulatory/Procedural Areas - Information given) - - -  Would patient like information on creating a medical advance directive? No - Patient declined No - Patient  declined - - No - patient declined information Yes - Transport planner given    Tobacco Social History   Tobacco Use  Smoking Status Former Smoker  . Years: 20.00  . Last attempt to quit: 04/12/2002  . Years since quitting: 16.3  Smokeless Tobacco Never Used     Counseling given: Not Answered   Clinical Intake Pain : No/denies pain      Past Medical History:  Diagnosis Date  . Anomalous right coronary artery   . Chronic systolic CHF (congestive heart failure) (HCC) 08/18/2016   Echo 1/17:  Diff HK especially in inf wall, mild LVH, EF 30-35, mild AS (mean 10, peak 18) // Echo 11/15: Mild LVH, EF 30-35, moderate HK, Gr 1 DD, mild AI, mild RAE // Echo 8/18: Mild LVH, EF 30-35, diffuse HK, mild aortic stenosis (mean 7), mild AI, mild BAE  . Coronary artery calcification 08/18/2016   Cor CTA 1/08: Ca score 248 // LHC 1/08: oLAD 25%  . History of ETOH abuse   . Hyperlipidemia 08/27/2013  . Hypertension   . Non-ischemic cardiomyopathy (HCC)    Presumed secondary to Etoh and HTN  (Prev EF 20-25% -- improved to 50%) // LHC 1/08: oLAD 25, EF 25%   . OSA (obstructive sleep apnea) 10/17/2013   Severe per home sleep study performed on 09/26/13.    History reviewed. No pertinent surgical history. Family History  Problem Relation Age of Onset  . Alcohol abuse Mother   . Heart disease Mother   . Alcohol abuse Father   . Heart failure  Father   . Heart disease Maternal Grandmother   . Stroke Maternal Grandmother   . Diabetes Neg Hx    Social History   Socioeconomic History  . Marital status: Married    Spouse name: [name]  . Number of children: 3  . Years of education: Not on file  . Highest education level: Not on file  Occupational History  . Occupation: Retired Set designer  . Financial resource strain: Not on file  . Food insecurity:    Worry: Not on file    Inability: Not on file  . Transportation needs:    Medical: Not on file    Non-medical: Not on file   Tobacco Use  . Smoking status: Former Smoker    Years: 20.00    Last attempt to quit: 04/12/2002    Years since quitting: 16.3  . Smokeless tobacco: Never Used  Substance and Sexual Activity  . Alcohol use: Yes    Alcohol/week: 7.0 standard drinks    Types: 7 Glasses of wine per week    Comment: 1 glass of wine each night.    . Drug use: No    Comment: history of marijuana use  . Sexual activity: Yes  Lifestyle  . Physical activity:    Days per week: Not on file    Minutes per session: Not on file  . Stress: Not on file  Relationships  . Social connections:    Talks on phone: Not on file    Gets together: Not on file    Attends religious service: Not on file    Active member of club or organization: Not on file    Attends meetings of clubs or organizations: Not on file    Relationship status: Not on file  Other Topics Concern  . Not on file  Social History Narrative   2 sons and daughter   1 son in [name]. Living with wife 65yrs [name]).  Use to live near [place].    Outpatient Encounter Medications as of 08/29/2018  Medication Sig  . aspirin EC 81 MG tablet Take 1 tablet (81 mg total) by mouth daily.  Marland Kitchen atorvastatin (LIPITOR) 10 MG tablet TAKE 1 TABLET BY MOUTH ONCE DAILY AT  6  00  PM  . carvedilol (COREG) 25 MG tablet Take 1 tablet by mouth twice daily  . furosemide (LASIX) 20 MG tablet Take 1 tablet (20 mg total) by mouth daily.  . hydrALAZINE (APRESOLINE) 10 MG tablet TAKE 1 TABLET BY MOUTH THREE TIMES DAILY  . isosorbide mononitrate (IMDUR) 30 MG 24 hr tablet Take 1 tablet (30 mg total) by mouth daily.  . potassium chloride (K-DUR,KLOR-CON) 10 MEQ tablet Take 1 tablet by mouth once daily  . spironolactone (ALDACTONE) 25 MG tablet Take 1/2 (one-half) tablet by mouth once daily  . diphenhydrAMINE (BENADRYL) 25 MG tablet Take 50 mg by mouth every 6 (six) hours as needed.   No facility-administered encounter medications on file as of 08/29/2018.     Activities of  Daily Living In your present state of health, do you have any difficulty performing the following activities: 08/29/2018 08/29/2018  Hearing? - N  Vision? (No Data) N  Comment wears transitional lens -  Difficulty concentrating or making decisions? N -  Walking or climbing stairs? N -  Dressing or bathing? N -  Doing errands, shopping? N -  Preparing Food and eating ? N -  Using the Toilet? N -  In the past six months,  have you accidently leaked urine? N -  Do you have problems with loss of bowel control? N -  Managing your Medications? N -  Managing your Finances? N -  Housekeeping or managing your Housekeeping? N -  Some recent data might be hidden    Patient Care Team: Sandford Craze, NP as PCP - General (Internal Medicine) Pricilla Riffle, MD as PCP - Cardiology (Cardiology) Barron Alvine, MD as Consulting Physician (Urology)   Assessment:   This is a routine wellness examination for [facility]. Physical assessment deferred to PCP.  Exercise Activities and Dietary recommendations Current Exercise Habits: Home exercise routine, Type of exercise: walking, Time (Minutes): 45, Frequency (Times/Week): 3, Weekly Exercise (Minutes/Week): 135, Intensity: Mild, Exercise limited by: None identified Diet (meal preparation, eat out, water intake, caffeinated beverages, dairy products, fruits and vegetables): in general, a "healthy" diet  , well balanced, on average, 3 meals per day   Goals    . Eat more fruits and vegetables    . Weight (lb) < 210 lb (95.3 kg)       Fall Risk Fall Risk  08/29/2018 08/23/2017 08/23/2017 08/11/2016 05/05/2016  Falls in the past year? 0 No No No No  Risk for fall due to : - Other (Comment) - - -    Depression Screen PHQ 2/9 Scores 08/29/2018 08/23/2017 08/11/2016 05/05/2016  PHQ - 2 Score 0 0 0 0    Cognitive Function Ad8 score reviewed for issues:  Issues making decisions:no  Less interest in hobbies / activities:no  Repeats questions, stories (family  complaining):no  Trouble using ordinary gadgets (microwave, computer, phone):no  Forgets the month or year: no  Mismanaging finances: no  Remembering appts:no  Daily problems with thinking and/or memory:no Ad8 score is=0  MMSE - Mini Mental State Exam 06/20/2015  Orientation to time 5  Orientation to Place 5  Registration 3  Attention/ Calculation 5  Recall 3  Language- name 2 objects 2  Language- repeat 1  Language- follow 3 step command 3  Language- read & follow direction 1  Write a sentence 1  Copy design 1  Total score 30        Immunization History  Administered Date(s) Administered  . Influenza Split 02/23/2011, 01/13/2012  . Influenza Whole 01/19/2008, 01/20/2009, 12/18/2009  . Influenza, High Dose Seasonal PF 02/02/2016, 02/02/2017, 12/11/2017  . Influenza,inj,Quad PF,6+ Mos 01/03/2013, 01/21/2014, 02/05/2015  . Pneumococcal Conjugate-13 01/21/2014, 02/05/2015  . Pneumococcal Polysaccharide-23 01/19/2008, 02/02/2016  . Td 01/29/2008  . Zoster 08/24/2011   Screening Tests Health Maintenance  Topic Date Due  . OPHTHALMOLOGY EXAM  01/16/1956  . TETANUS/TDAP  01/28/2018  . HEMOGLOBIN A1C  02/23/2018  . FOOT EXAM  08/24/2018  . INFLUENZA VACCINE  11/11/2018  . COLONOSCOPY  09/20/2027  . Hepatitis C Screening  Completed  . PNA vac Low Risk Adult  Completed       Plan:   See you next year!  Keep up the great work!  I have personally reviewed and noted the following in the patient's chart:   . Medical and social history . Use of alcohol, tobacco or illicit drugs  . Current medications and supplements . Functional ability and status . Nutritional status . Physical activity . Advanced directives . List of other physicians . Hospitalizations, surgeries, and ER visits in previous 12 months . Vitals . Screenings to include cognitive, depression, and falls . Referrals and appointments  In addition, I have reviewed and discussed with patient certain  preventive protocols,  quality metrics, and best practice recommendations. A written personalized care plan for preventive services as well as general preventive health recommendations were provided to patient.     Avon Gully, [place]  08/29/2018

## 2018-08-29 ENCOUNTER — Encounter: Payer: Self-pay | Admitting: *Deleted

## 2018-08-29 ENCOUNTER — Other Ambulatory Visit: Payer: Self-pay

## 2018-08-29 ENCOUNTER — Encounter: Payer: Self-pay | Admitting: Family

## 2018-08-29 ENCOUNTER — Ambulatory Visit (INDEPENDENT_AMBULATORY_CARE_PROVIDER_SITE_OTHER): Payer: Medicare Other | Admitting: *Deleted

## 2018-08-29 ENCOUNTER — Ambulatory Visit (INDEPENDENT_AMBULATORY_CARE_PROVIDER_SITE_OTHER): Payer: Medicare Other | Admitting: Family

## 2018-08-29 VITALS — BP 113/75 | HR 68

## 2018-08-29 VITALS — BP 113/75 | HR 68 | Wt 209.0 lb

## 2018-08-29 DIAGNOSIS — Z0000 Encounter for general adult medical examination without abnormal findings: Secondary | ICD-10-CM | POA: Diagnosis not present

## 2018-08-29 DIAGNOSIS — E785 Hyperlipidemia, unspecified: Secondary | ICD-10-CM | POA: Diagnosis not present

## 2018-08-29 DIAGNOSIS — R739 Hyperglycemia, unspecified: Secondary | ICD-10-CM | POA: Diagnosis not present

## 2018-08-29 DIAGNOSIS — I2510 Atherosclerotic heart disease of native coronary artery without angina pectoris: Secondary | ICD-10-CM | POA: Diagnosis not present

## 2018-08-29 DIAGNOSIS — I2584 Coronary atherosclerosis due to calcified coronary lesion: Secondary | ICD-10-CM | POA: Diagnosis not present

## 2018-08-29 DIAGNOSIS — I10 Essential (primary) hypertension: Secondary | ICD-10-CM

## 2018-08-29 DIAGNOSIS — I5022 Chronic systolic (congestive) heart failure: Secondary | ICD-10-CM | POA: Diagnosis not present

## 2018-08-29 NOTE — Progress Notes (Signed)
Virtual Visit via Video Note  I connected with [name] on 08/29/18 at  9:20 AM EDT by a video enabled telemedicine application and verified that I am speaking with the correct person using two identifiers. This visit type was conducted due to national recommendations for restrictions regarding the COVID-19 Pandemic (e.g. social distancing).  This format is felt to be most appropriate for this patient at this time.   I discussed the limitations of evaluation and management by telemedicine and the availability of in person appointments. The patient expressed understanding and agreed to proceed.  Only the patient and myself were on today's video visit. The patient was at home and I was in my office at the time of today's visit.   History of Present Illness:  Patient is a 73 yr old male who presents today for routine follow up.  Hyperlipidemia- maintained on lipitor 10mg .  Lab Results  Component Value Date   CHOL 136 02/28/2018   HDL 61.50 02/28/2018   LDLCALC 58 02/28/2018   TRIG 80.0 02/28/2018   CHOLHDL 2 02/28/2018   Hyperglycemia-  Lab Results  Component Value Date   HGBA1C 6.4 08/23/2017   HTN- bp meds include coreg, hydralazine.  Imdur, aldactone. Reports 113/75 hr 68 this AM. Reports most recent weight 209.  Wt Readings from Last 3 Encounters:  02/28/18 203 lb (92.1 kg)  01/10/18 203 lb 12.8 oz (92.4 kg)  08/23/17 223 lb 9.6 oz (101.4 kg)    BP Readings from Last 3 Encounters:  08/29/18 113/75  08/29/18 113/75  02/28/18 126/64   Hx of CAD/chf- he continues to follow with cardiology.    Observations/Objective:   Gen: Awake, alert, no acute distress Resp: Breathing is even and non-labored Psych: calm/pleasant demeanor Neuro: Alert and Oriented x 3, + facial symmetry, speech is clear.   Assessment and Plan:  HTN- bp is stable. Continue current meds.  Hyperglycemia- encouraged walking/weight loss.  Plan a1c next visit when we bring him for a face to face  visit.  Hyperlipidemia- LDL at goal.  CAD/CHF- clinically stable. He plans to arrange follow up with cardiology.     Follow Up Instructions:  3 months for face to face visit with lab work.    I discussed the assessment and treatment plan with the patient. The patient was provided an opportunity to ask questions and all were answered. The patient agreed with the plan and demonstrated an understanding of the instructions.   The patient was advised to call back or seek an in-person evaluation if the symptoms worsen or if the condition fails to improve as anticipated.    Lemont Fillers, NP

## 2018-08-29 NOTE — Patient Instructions (Signed)
See you next year!  Keep up the great work!   [name] , Thank you for taking time to come for your Medicare Wellness Visit. I appreciate your ongoing commitment to your health goals. Please review the following plan we discussed and let me know if I can assist you in the future.   These are the goals we discussed: Goals    . Eat more fruits and vegetables    . Maintain current health.    . Weight (lb) < 210 lb (95.3 kg)       This is a list of the screening recommended for you and due dates:  Health Maintenance  Topic Date Due  . Eye exam for diabetics  01/16/1956  . Tetanus Vaccine  01/28/2018  . Hemoglobin A1C  02/23/2018  . Complete foot exam   08/24/2018  . Flu Shot  11/11/2018  . Colon Cancer Screening  09/20/2027  .  Hepatitis C: One time screening is recommended by Center for Disease Control  (CDC) for  adults born from [birth year] through 1964.   Completed  . Pneumonia vaccines  Completed    Health Maintenance After Age 50 After age 76, you are at a higher risk for certain long-term diseases and infections as well as injuries from falls. Falls are a major cause of broken bones and head injuries in people who are older than age 80. Getting regular preventive care can help to keep you healthy and well. Preventive care includes getting regular testing and making lifestyle changes as recommended by your health care provider. Talk with your health care provider about:  Which screenings and tests you should have. A screening is a test that checks for a disease when you have no symptoms.  A diet and exercise plan that is right for you. What should I know about screenings and tests to prevent falls? Screening and testing are the best ways to find a health problem early. Early diagnosis and treatment give you the best chance of managing medical conditions that are common after age 15. Certain conditions and lifestyle choices may make you more likely to have a fall. Your health care  provider may recommend:  Regular vision checks. Poor vision and conditions such as cataracts can make you more likely to have a fall. If you wear glasses, make sure to get your prescription updated if your vision changes.  Medicine review. Work with your health care provider to regularly review all of the medicines you are taking, including over-the-counter medicines. Ask your health care provider about any side effects that may make you more likely to have a fall. Tell your health care provider if any medicines that you take make you feel dizzy or sleepy.  Osteoporosis screening. Osteoporosis is a condition that causes the bones to get weaker. This can make the bones weak and cause them to break more easily.  Blood pressure screening. Blood pressure changes and medicines to control blood pressure can make you feel dizzy.  Strength and balance checks. Your health care provider may recommend certain tests to check your strength and balance while standing, walking, or changing positions.  Foot health exam. Foot pain and numbness, as well as not wearing proper footwear, can make you more likely to have a fall.  Depression screening. You may be more likely to have a fall if you have a fear of falling, feel emotionally low, or feel unable to do activities that you used to do.  Alcohol use screening. Using too  much alcohol can affect your balance and may make you more likely to have a fall. What actions can I take to lower my risk of falls? General instructions  Talk with your health care provider about your risks for falling. Tell your health care provider if: ? You fall. Be sure to tell your health care provider about all falls, even ones that seem minor. ? You feel dizzy, sleepy, or off-balance.  Take over-the-counter and prescription medicines only as told by your health care provider. These include any supplements.  Eat a healthy diet and maintain a healthy weight. A healthy diet includes  low-fat dairy products, low-fat (lean) meats, and fiber from whole grains, beans, and lots of fruits and vegetables. Home safety  Remove any tripping hazards, such as rugs, cords, and clutter.  Install safety equipment such as grab bars in bathrooms and safety rails on stairs.  Keep rooms and walkways well-lit. Activity   Follow a regular exercise program to stay fit. This will help you maintain your balance. Ask your health care provider what types of exercise are appropriate for you.  If you need a cane or walker, use it as recommended by your health care provider.  Wear supportive shoes that have nonskid soles. Lifestyle  Do not drink alcohol if your health care provider tells you not to drink.  If you drink alcohol, limit how much you have: ? 0-1 drink a day for women. ? 0-2 drinks a day for men.  Be aware of how much alcohol is in your drink. In the [place] one drink equals one typical bottle of beer (12 oz), one-half glass of wine (5 oz), or one shot of hard liquor (1 oz).  Do not use any products that contain nicotine or tobacco, such as cigarettes and e-cigarettes. If you need help quitting, ask your health care provider. Summary  Having a healthy lifestyle and getting preventive care can help to protect your health and wellness after age 4.  Screening and testing are the best way to find a health problem early and help you avoid having a fall. Early diagnosis and treatment give you the best chance for managing medical conditions that are more common for people who are older than age 56.  Falls are a major cause of broken bones and head injuries in people who are older than age 62. Take precautions to prevent a fall at home.  Work with your health care provider to learn what changes you can make to improve your health and wellness and to prevent falls. This information is not intended to replace advice given to you by your health care provider. Make sure you discuss any  questions you have with your health care provider. Document Released: 02/09/2017 Document Revised: 02/09/2017 Document Reviewed: 02/09/2017 Elsevier Interactive Patient Education  2019 [employer].

## 2018-08-29 NOTE — Progress Notes (Signed)
RN note reviewed.   Tamme Mozingo S O'Sullivan NP 

## 2018-10-02 ENCOUNTER — Telehealth: Payer: Self-pay | Admitting: Family

## 2018-10-02 ENCOUNTER — Ambulatory Visit (INDEPENDENT_AMBULATORY_CARE_PROVIDER_SITE_OTHER): Payer: Medicare Other

## 2018-10-02 DIAGNOSIS — W57XXXA Bitten or stung by nonvenomous insect and other nonvenomous arthropods, initial encounter: Secondary | ICD-10-CM

## 2018-10-02 DIAGNOSIS — Z20822 Contact with and (suspected) exposure to covid-19: Secondary | ICD-10-CM

## 2018-10-02 DIAGNOSIS — Z20828 Contact with and (suspected) exposure to other viral communicable diseases: Secondary | ICD-10-CM

## 2018-10-02 NOTE — Telephone Encounter (Signed)
Patient with covid exposure, recent fever, chills diarrhea, needs covid testing.

## 2018-10-02 NOTE — Telephone Encounter (Signed)
Spoke with patient.  Scheduled him for COVID 19 testing tomorrow morning at [facility] at 8 am.  Patient notified of testing protocol, he expressed understanding.

## 2018-10-02 NOTE — Addendum Note (Signed)
Addended by: Debbrah Alar on: 10/02/2018 02:48 PM   Modules accepted: Level of Service

## 2018-10-02 NOTE — Telephone Encounter (Signed)
Reports + lone star tick on 6/11 Reports that on Saturday night he   He denies c/o rash.  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

## 2018-10-02 NOTE — Telephone Encounter (Signed)
Virtual Visit via phone Note  I connected with [name] today by telephone and verified that I am speaking with the correct person using two identifiers.  Location: Patient: home Provider: home   I discussed the limitations of evaluation and management by telemedicine and the availability of in person appointments. The patient expressed understanding and agreed to proceed.  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  Notes that over the weekend he had fever, body aches and temp with a tmax of 100.8.  Denies any symptoms today.  2) Tick bite- reports bite by a lone star tick on 09/21/18.  Wonders if his symptoms over the weekend could be due to tickborne illness. Denies rash or joint pain.   Observations/Objective:   Gen: Awake, alert, no acute distress Resp: Breathing is even and non-labored Psych: calm/pleasant demeanor Neuro: Alert and Oriented x 3, speech is clear.   Assessment and Plan:  Covid-19 exposure- advised pt that I would recommend that he continue to self quarantine and that we get him tested for COVID-19. Will arrange.  Tick Bite- discussed testing for lyme disease. Given resolution of his symptoms over the weekend and lack of rash, I think that lyme is unlikely. We can certainly test him once he is recovered from his ? covid illness and his testing comes back negative. Pt verbalizes understanding.  11 minutes spent on today's call.  Follow Up Instructions:    I discussed the assessment and treatment plan with the patient. The patient was provided an opportunity to ask questions and all were answered. The patient agreed with the plan and demonstrated an understanding of the instructions.   The patient was advised to call back or seek an in-person evaluation if the symptoms worsen or if the condition fails to improve as anticipated.  Nance Pear,  NP

## 2018-10-02 NOTE — Telephone Encounter (Signed)
Pt c/o 2 day h/o covid 19 like symptoms. Symptoms started last Friday with diarrhea then Saturday pt had chills with fever to 100.8,body aches.  No symptoms Sunday or today. 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 Pt is asking for an order to screen for covid. Pt stated that his employer will not let him come back to work until he has one. Pt is high risk with CHF, HTN, cardiomyopathy, OSA. Called office several times got busy signal and then call was answered and then was disconnected.  Advised pt that I would send note to office in case they want to order covid screening.  Pt is self isolating from family.  Care advice given and pt verbalized understanding. Routing note back to office for review and advice.      Reason for Disposition . HIGH RISK patient (e.g., age > 103 years, diabetes, heart or lung disease, weak immune system)  Answer Assessment - Initial Assessment Questions 1. COVID-19 DIAGNOSIS: "Who made your Coronavirus (COVID-19) diagnosis?" "Was it confirmed by a positive lab test?" If not diagnosed by a HCP, ask "Are there lots of cases (community spread) where you live?" (See public health department website, if unsure)     N/a-no-pt was wanting a covid test  2. ONSET: "When did the COVID-19 symptoms start?"      Only lasted 2 days Friday diarrhea 3. WORST SYMPTOM: "What is your worst symptom?" (e.g., cough, fever, shortness of breath, muscle aches)     No symptoms today 4. COUGH: "Do you have a cough?" If so, ask: "How bad is the cough?"       no 5. FEVER: "Do you have a fever?" If so, ask: "What is your temperature, how was it measured, and when did it start?"     no 6. RESPIRATORY STATUS: "Describe your breathing?" (e.g., shortness of breath, wheezing, unable to speak)      No breathing issues 7. BETTER-SAME-WORSE: "Are you getting better, staying the same or getting worse compared to yesterday?"  If getting worse, ask, "In what way?"  better 8. HIGH RISK DISEASE: "Do you have any chronic medical problems?" (e.g., asthma, heart or lung disease, weak immune system, etc.)     HTN, 2008 "heart failure" 9. PREGNANCY: "Is there any chance you are pregnant?" "When was your last menstrual period?"     n/a 10. OTHER SYMPTOMS: "Do you have any other symptoms?"  (e.g., chills, fatigue, headache, loss of smell or taste, muscle pain, sore throat)       Started with diarrhea on Friday, fever to 100.8 Saturday night, chills, body aches No symptoms today.  Protocols used: CORONAVIRUS (COVID-19) DIAGNOSED OR SUSPECTED-A-AH

## 2018-10-03 ENCOUNTER — Other Ambulatory Visit: Payer: Medicare Other

## 2018-10-03 DIAGNOSIS — R6889 Other general symptoms and signs: Secondary | ICD-10-CM | POA: Diagnosis not present

## 2018-10-03 DIAGNOSIS — Z20822 Contact with and (suspected) exposure to covid-19: Secondary | ICD-10-CM

## 2018-10-06 ENCOUNTER — Encounter: Payer: Self-pay | Admitting: Family

## 2018-10-06 ENCOUNTER — Telehealth (INDEPENDENT_AMBULATORY_CARE_PROVIDER_SITE_OTHER): Payer: Medicare Other | Admitting: Family

## 2018-10-06 DIAGNOSIS — R509 Fever, unspecified: Secondary | ICD-10-CM | POA: Diagnosis not present

## 2018-10-06 DIAGNOSIS — R52 Pain, unspecified: Secondary | ICD-10-CM

## 2018-10-06 DIAGNOSIS — I2584 Coronary atherosclerosis due to calcified coronary lesion: Secondary | ICD-10-CM | POA: Diagnosis not present

## 2018-10-06 DIAGNOSIS — W57XXXD Bitten or stung by nonvenomous insect and other nonvenomous arthropods, subsequent encounter: Secondary | ICD-10-CM

## 2018-10-06 DIAGNOSIS — I2510 Atherosclerotic heart disease of native coronary artery without angina pectoris: Secondary | ICD-10-CM | POA: Diagnosis not present

## 2018-10-06 LAB — NOVEL CORONAVIRUS, NAA: SARS-CoV-2, NAA: NOT DETECTED

## 2018-10-06 NOTE — Telephone Encounter (Signed)
Spoke with pt. He states he has removed the tick. I did not discuss COVID results with pt. Scheduled pt a mychart video visit this afternoon at 3:20 with Melissa.

## 2018-10-06 NOTE — Progress Notes (Signed)
Virtual Visit via Video Note  I connected with [name] on 10/06/18 at  3:20 PM EDT by a video enabled telemedicine application and verified that I am speaking with the correct person using two identifiers.  Location: Patient: home Provider: work   I discussed the limitations of evaluation and management by telemedicine and the availability of in person appointments. The patient expressed understanding and agreed to proceed.  History of Present Illness:  Patient is a 73 yr old male who presents today for follow up. We last saw him on 10/02/18. Had fever, body aches on Saturday the 20'th. He had diarrhea.    Denies current fever, body aches or rashes. COVID testing was negative. He reports that his COVID-19 test was negative. He is concerned about a recent tick bite being the cause of his previous symptoms.   Past Medical History:  Diagnosis Date  . Anomalous right coronary artery   . Chronic systolic CHF (congestive heart failure) ([place]) 08/18/2016   Echo 1/17:  Diff HK especially in inf wall, mild LVH, EF 30-35, mild AS (mean 10, peak 18) // Echo 11/15: Mild LVH, EF 30-35, moderate HK, Gr 1 DD, mild AI, mild RAE // Echo 8/18: Mild LVH, EF 30-35, diffuse HK, mild aortic stenosis (mean 7), mild AI, mild BAE  . Coronary artery calcification 08/18/2016   Cor CTA 1/08: Ca score 248 // LHC 1/08: oLAD 25%  . History of ETOH abuse   . Hyperlipidemia 08/27/2013  . Hypertension   . Non-ischemic cardiomyopathy ([place])    Presumed secondary to Etoh and HTN  (Prev EF 20-25% -- improved to 50%) // LHC 1/08: oLAD 25, EF 25%   . OSA (obstructive sleep apnea) 10/17/2013   Severe per home sleep study performed on 09/26/13.      Social History   Socioeconomic History  . Marital status: Married    Spouse name: Constance Holster  . Number of children: 3  . Years of education: Not on file  . Highest education level: Not on file  Occupational History  . Occupation: Retired Research officer, trade union  . Financial  resource strain: Not on file  . Food insecurity    Worry: Not on file    Inability: Not on file  . Transportation needs    Medical: Not on file    Non-medical: Not on file  Tobacco Use  . Smoking status: Former Smoker    Years: 20.00    Quit date: 04/12/2002    Years since quitting: 16.4  . Smokeless tobacco: Never Used  Substance and Sexual Activity  . Alcohol use: Yes    Alcohol/week: 7.0 standard drinks    Types: 7 Glasses of wine per week    Comment: 1 glass of wine each night.    . Drug use: No    Comment: history of marijuana use  . Sexual activity: Yes  Lifestyle  . Physical activity    Days per week: Not on file    Minutes per session: Not on file  . Stress: Not on file  Relationships  . Social Herbalist on phone: Not on file    Gets together: Not on file    Attends religious service: Not on file    Active member of club or organization: Not on file    Attends meetings of clubs or organizations: Not on file    Relationship status: Not on file  . Intimate partner violence    Fear of current  or ex partner: Not on file    Emotionally abused: Not on file    Physically abused: Not on file    Forced sexual activity: Not on file  Other Topics Concern  . Not on file  Social History Narrative   2 sons and daughter   1 son in [name]. Living with wife 76yrs [name]).  Use to live near [place].    No past surgical history on file.  Family History  Problem Relation Age of Onset  . Alcohol abuse Mother   . Heart disease Mother   . Alcohol abuse Father   . Heart failure Father   . Heart disease Maternal Grandmother   . Stroke Maternal Grandmother   . Diabetes Neg Hx     Allergies  Allergen Reactions  . Lisinopril Swelling    ANGIOEDEMA    Current Outpatient Medications on File Prior to Visit  Medication Sig Dispense Refill  . aspirin EC 81 MG tablet Take 1 tablet (81 mg total) by mouth daily. 90 tablet 3  . atorvastatin (LIPITOR) 10 MG tablet  TAKE 1 TABLET BY MOUTH ONCE DAILY AT  6  00  PM 90 tablet 1  . carvedilol (COREG) 25 MG tablet Take 1 tablet by mouth twice daily 180 tablet 0  . diphenhydrAMINE (BENADRYL) 25 MG tablet Take 50 mg by mouth every 6 (six) hours as needed.    . furosemide (LASIX) 20 MG tablet Take 1 tablet (20 mg total) by mouth daily. 90 tablet 3  . hydrALAZINE (APRESOLINE) 10 MG tablet TAKE 1 TABLET BY MOUTH THREE TIMES DAILY 270 tablet 3  . isosorbide mononitrate (IMDUR) 30 MG 24 hr tablet Take 1 tablet (30 mg total) by mouth daily. 90 tablet 3  . potassium chloride (K-DUR,KLOR-CON) 10 MEQ tablet Take 1 tablet by mouth once daily 90 tablet 0  . spironolactone (ALDACTONE) 25 MG tablet Take 1/2 (one-half) tablet by mouth once daily 45 tablet 1   No current facility-administered medications on file prior to visit.     There were no vitals taken for this visit.   Observations/Objective:   Gen: Awake, alert, no acute distress Resp: Breathing is even and non-labored Psych: calm/pleasant demeanor Neuro: Alert and Oriented x 3, + facial symmetry, speech is clear.   Assessment and Plan:  Fever/Body aches following tick bite- will have pt return for lyme and ehrlichia.  Clinical suspicion is low.       Follow Up Instructions:    I discussed the assessment and treatment plan with the patient. The patient was provided an opportunity to ask questions and all were answered. The patient agreed with the plan and demonstrated an understanding of the instructions.   The patient was advised to call back or seek an in-person evaluation if the symptoms worsen or if the condition fails to improve as anticipated.  Lemont Fillers, NP

## 2018-10-06 NOTE — Telephone Encounter (Signed)
These questions were addressed at his visit today.

## 2018-10-09 ENCOUNTER — Encounter: Payer: Self-pay | Admitting: Family

## 2018-10-09 NOTE — Telephone Encounter (Signed)
Patient was able to print results. Appointment was set up for labs.

## 2018-10-12 ENCOUNTER — Other Ambulatory Visit (INDEPENDENT_AMBULATORY_CARE_PROVIDER_SITE_OTHER): Payer: Medicare Other

## 2018-10-12 ENCOUNTER — Other Ambulatory Visit: Payer: Self-pay

## 2018-10-12 DIAGNOSIS — R52 Pain, unspecified: Secondary | ICD-10-CM

## 2018-10-12 DIAGNOSIS — W57XXXD Bitten or stung by nonvenomous insect and other nonvenomous arthropods, subsequent encounter: Secondary | ICD-10-CM

## 2018-10-12 DIAGNOSIS — R509 Fever, unspecified: Secondary | ICD-10-CM | POA: Diagnosis not present

## 2018-10-16 LAB — LYME AB/WESTERN BLOT REFLEX
LYME DISEASE AB, QUANT, IGM: <0.8 index (ref 0.00–0.79)
Lyme IgG/IgM Ab: <0.91 {ISR} (ref 0.00–0.90)

## 2018-10-16 LAB — EHRLICHIA ANTIBODY PANEL
E. CHAFFEENSIS AB IGG: <1:64 {titer}
E. CHAFFEENSIS AB IGM: <1:20 {titer}

## 2018-11-08 ENCOUNTER — Other Ambulatory Visit: Payer: Self-pay | Admitting: Family

## 2018-11-08 ENCOUNTER — Other Ambulatory Visit: Payer: Self-pay | Admitting: Internal Medicine

## 2018-11-28 ENCOUNTER — Ambulatory Visit (INDEPENDENT_AMBULATORY_CARE_PROVIDER_SITE_OTHER): Payer: Medicare Other | Admitting: Family

## 2018-11-28 ENCOUNTER — Other Ambulatory Visit: Payer: Self-pay

## 2018-11-28 ENCOUNTER — Encounter: Payer: Self-pay | Admitting: Family

## 2018-11-28 ENCOUNTER — Telehealth: Payer: Self-pay | Admitting: Family

## 2018-11-28 VITALS — BP 124/68 | HR 76 | Temp 96.8°F | Resp 16 | Ht 69.0 in | Wt 223.0 lb

## 2018-11-28 DIAGNOSIS — G4733 Obstructive sleep apnea (adult) (pediatric): Secondary | ICD-10-CM

## 2018-11-28 DIAGNOSIS — Z8679 Personal history of other diseases of the circulatory system: Secondary | ICD-10-CM | POA: Diagnosis not present

## 2018-11-28 DIAGNOSIS — I2584 Coronary atherosclerosis due to calcified coronary lesion: Secondary | ICD-10-CM | POA: Diagnosis not present

## 2018-11-28 DIAGNOSIS — I2510 Atherosclerotic heart disease of native coronary artery without angina pectoris: Secondary | ICD-10-CM | POA: Diagnosis not present

## 2018-11-28 DIAGNOSIS — R739 Hyperglycemia, unspecified: Secondary | ICD-10-CM

## 2018-11-28 DIAGNOSIS — I5022 Chronic systolic (congestive) heart failure: Secondary | ICD-10-CM | POA: Diagnosis not present

## 2018-11-28 DIAGNOSIS — E785 Hyperlipidemia, unspecified: Secondary | ICD-10-CM | POA: Diagnosis not present

## 2018-11-28 LAB — COMPREHENSIVE METABOLIC PANEL
ALT: 15 U/L (ref 0–53)
AST: 15 U/L (ref 0–37)
Albumin: 4 g/dL (ref 3.5–5.2)
Alkaline Phosphatase: 36 U/L — ABNORMAL LOW (ref 39–117)
BUN: 14 mg/dL (ref 6–23)
CO2: 31 mEq/L (ref 19–32)
Calcium: 9.1 mg/dL (ref 8.4–10.5)
Chloride: 103 mEq/L (ref 96–112)
Creatinine, Ser: 1.19 mg/dL (ref 0.40–1.50)
GFR: 72.74 mL/min (ref >60.00)
Glucose, Bld: 87 mg/dL (ref 70–99)
Potassium: 4.2 mEq/L (ref 3.5–5.1)
Sodium: 138 mEq/L (ref 135–145)
Total Bilirubin: 0.6 mg/dL (ref 0.2–1.2)
Total Protein: 6.7 g/dL (ref 6.0–8.3)

## 2018-11-28 LAB — HEMOGLOBIN A1C: Hgb A1c MFr Bld: 6.1 % (ref 4.6–6.5)

## 2018-11-28 MED ORDER — SHINGRIX 50 MCG/0.5ML IM SUSR: INTRAMUSCULAR | 1 refills | Status: DC

## 2018-11-28 NOTE — Patient Instructions (Signed)
Please complete lab work prior to leaving.   

## 2018-11-28 NOTE — Telephone Encounter (Signed)
Can you please request a cpap download report from his home health company?

## 2018-11-28 NOTE — Progress Notes (Signed)
Subjective:    Patient ID: [name], male    DOB: 04/16/1945, 73 y.o.   MRN: [id number]  HPI  Patient is a 73 yr old male who presents today for follow up.    HTN- on imdur, hydralazine, coreg.  BP Readings from Last 3 Encounters:  11/28/18 124/68  08/29/18 113/75  08/29/18 113/75   Hyperglycemia- He has gained some weight recently. He has attributed this weight gain to remodeling his kitchen.    Wt Readings from Last 3 Encounters:  11/28/18 223 lb (101.2 kg)  08/29/18 209 lb (94.8 kg)  02/28/18 203 lb (92.1 kg)    Lab Results  Component Value Date   HGBA1C 6.4 08/23/2017   Hyperlipidemia- Maintained on lipitor  Lab Results  Component Value Date   CHOL 136 02/28/2018   HDL 61.50 02/28/2018   LDLCALC 58 02/28/2018   TRIG 80.0 02/28/2018   CHOLHDL 2 02/28/2018   CAD/CHF- Denies CP/SOB/Swelling.   Review of Systems See HPI  Past Medical History:  Diagnosis Date  . Anomalous right coronary artery   . Chronic systolic CHF (congestive heart failure) ([facility]) 08/18/2016   Echo 1/17:  Diff HK especially in inf wall, mild LVH, EF 30-35, mild AS (mean 10, peak 18) // Echo 11/15: Mild LVH, EF 30-35, moderate HK, Gr 1 DD, mild AI, mild RAE // Echo 8/18: Mild LVH, EF 30-35, diffuse HK, mild aortic stenosis (mean 7), mild AI, mild BAE  . Coronary artery calcification 08/18/2016   Cor CTA 1/08: Ca score 248 // LHC 1/08: oLAD 25%  . History of ETOH abuse   . Hyperlipidemia 08/27/2013  . Hypertension   . Non-ischemic cardiomyopathy ([facility])    Presumed secondary to Etoh and HTN  (Prev EF 20-25% -- improved to 50%) // LHC 1/08: oLAD 25, EF 25%   . OSA (obstructive sleep apnea) 10/17/2013   Severe per home sleep study performed on 09/26/13.      Social History   Socioeconomic History  . Marital status: Married    Spouse name: Nelva Bushorma  . Number of children: 3  . Years of education: Not on file  . Highest education level: Not on file  Occupational History  . Occupation: Retired  [employer]  Social Needs  . Financial resource strain: Not on file  . Food insecurity    Worry: Not on file    Inability: Not on file  . Transportation needs    Medical: Not on file    Non-medical: Not on file  Tobacco Use  . Smoking status: Former Smoker    Years: 20.00    Quit date: 04/12/2002    Years since quitting: 16.6  . Smokeless tobacco: Never Used  Substance and Sexual Activity  . Alcohol use: Yes    Alcohol/week: 7.0 standard drinks    Types: 7 Glasses of wine per week    Comment: 1 glass of wine each night.    . Drug use: No    Comment: history of marijuana use  . Sexual activity: Yes  Lifestyle  . Physical activity    Days per week: Not on file    Minutes per session: Not on file  . Stress: Not on file  Relationships  . Social Musicianconnections    Talks on phone: Not on file    Gets together: Not on file    Attends religious service: Not on file    Active member of club or organization: Not on file    Attends  meetings of clubs or organizations: Not on file    Relationship status: Not on file  . Intimate partner violence    Fear of current or ex partner: Not on file    Emotionally abused: Not on file    Physically abused: Not on file    Forced sexual activity: Not on file  Other Topics Concern  . Not on file  Social History Narrative   2 sons and daughter   1 son in [employer]. Living with wife 49yrs Constance Holster).  Use to live near [place].    No past surgical history on file.  Family History  Problem Relation Age of Onset  . Alcohol abuse Mother   . Heart disease Mother   . Alcohol abuse Father   . Heart failure Father   . Heart disease Maternal Grandmother   . Stroke Maternal Grandmother   . Diabetes Neg Hx     Allergies  Allergen Reactions  . Lisinopril Swelling    ANGIOEDEMA    Current Outpatient Medications on File Prior to Visit  Medication Sig Dispense Refill  . aspirin EC 81 MG tablet Take 1 tablet (81 mg total) by mouth daily. 90 tablet 3  .  atorvastatin (LIPITOR) 10 MG tablet TAKE 1 TABLET BY MOUTH ONCE DAILY AT  6  00  PM 90 tablet 1  . carvedilol (COREG) 25 MG tablet Take 1 tablet by mouth twice daily 180 tablet 0  . diphenhydrAMINE (BENADRYL) 25 MG tablet Take 50 mg by mouth every 6 (six) hours as needed.    . furosemide (LASIX) 20 MG tablet Take 1 tablet (20 mg total) by mouth daily. 90 tablet 3  . hydrALAZINE (APRESOLINE) 10 MG tablet TAKE 1 TABLET BY MOUTH THREE TIMES DAILY 270 tablet 0  . isosorbide mononitrate (IMDUR) 30 MG 24 hr tablet Take 1 tablet (30 mg total) by mouth daily. 90 tablet 3  . potassium chloride (K-DUR) 10 MEQ tablet Take 1 tablet by mouth once daily 90 tablet 0  . spironolactone (ALDACTONE) 25 MG tablet Take 1/2 (one-half) tablet by mouth once daily 45 tablet 1   No current facility-administered medications on file prior to visit.     BP 124/68 (BP Location: Right Arm, Patient Position: Sitting, Cuff Size: Large)   Pulse 76   Temp (!) 96.8 F (36 C) (Temporal)   Resp 16   Ht 5\' 9"  (1.753 m)   Wt 223 lb (101.2 kg)   SpO2 99%   BMI 32.93 kg/m       Objective:   Physical Exam Constitutional:      General: He is not in acute distress.    Appearance: He is well-developed.  HENT:     Head: Normocephalic and atraumatic.  Cardiovascular:     Rate and Rhythm: Normal rate and regular rhythm.     Heart sounds: No murmur.  Pulmonary:     Effort: Pulmonary effort is normal. No respiratory distress.     Breath sounds: Normal breath sounds. No wheezing or rales.  Skin:    General: Skin is warm and dry.  Neurological:     Mental Status: He is alert and oriented to person, place, and time.  Psychiatric:        Behavior: Behavior normal.        Thought Content: Thought content normal.           Assessment & Plan:  HTN- bp stable on current meds.  CHF/CAD- clinically euvolemic/asymptomatic.  Management per cardiology.  Hyperglycemia- obtain follow up A1C. Discussed healthy diet,  exercise/weight loss.   Hyperlipidemia- at goal on statin.  Continue same.   OSA-  Will request download report from his home DME company.

## 2018-11-29 NOTE — Telephone Encounter (Signed)
cpap report requested at [facility].

## 2018-12-06 ENCOUNTER — Telehealth: Payer: Self-pay

## 2018-12-06 NOTE — Telephone Encounter (Signed)
Copied from [place] [phone]. Topic: General - Other >> Dec 06, 2018  2:05 PM Leward Quan A wrote: Reason for CRM: Patient called ti inform Melissa that he had an incident in the morning of SOB no other symptoms. Asking for a call back from [facility] [address] Ph# [phone]

## 2018-12-07 MED FILL — SHINGRIX 50 MCG SUS: 50 | 1 days supply | Qty: 1 | Fill #0

## 2018-12-07 NOTE — Telephone Encounter (Signed)
Called patient a few times to home and cell number but no answer. Left message for him to call the office back when he received the message.

## 2018-12-08 ENCOUNTER — Ambulatory Visit (INDEPENDENT_AMBULATORY_CARE_PROVIDER_SITE_OTHER): Payer: Medicare Other | Admitting: Family

## 2018-12-08 DIAGNOSIS — R0602 Shortness of breath: Secondary | ICD-10-CM

## 2018-12-08 NOTE — Telephone Encounter (Signed)
Talked to patient, he will have a virtual visit later this afternoon

## 2018-12-08 NOTE — Telephone Encounter (Signed)
Talked to wife today she reports patient is at work, she said patient had SOB Wednesday at around noon, initial bp at that time was 184/119. He put on his CPAP machine and he felt better and bp went down to about 114/70. He was ok after that the rest of the day. On Thursday he was starting to have same symptoms when drinking coffee but he stopped it and increased water intake and "felt much better". She reports today he is feeling well.  Incident on Wednesday started after drinking coffee.

## 2018-12-08 NOTE — Telephone Encounter (Signed)
Let's try to set up a virtual visit at the end of the day today.  If he is unable, then virtual visit on Monday please.  He should go to the ER over the weekend if he develops recurrent SOB.

## 2018-12-08 NOTE — Progress Notes (Signed)
Virtual Visit via Telephone Note  I connected with [name] on 12/08/18 at  3:40 PM EDT by telephone and verified that I am speaking with the correct person using two identifiers.  Location: Patient: home Provider: work   I discussed the limitations, risks, security and privacy concerns of performing an evaluation and management service by telephone and the availability of in person appointments. I also discussed with the patient that there may be a patient responsible charge related to this service. The patient expressed understanding and agreed to proceed.   History of Present Illness:  Patient is a 73 yr old male who presents today for phone visit to discuss SOB.  Reports that he had an episode of SOB which occurred after he drank a latte. Reports that he tried a latte again the next day and had recurrent SOB.  Reports that he checked his blood pressure and it was elevated, then it came down.  Also drank some water and felt better.  122/70 HR 62 (on Wednesday night). Thursday he drank the second latte.   Denies CP, palpitations, fever, or myalgia.  Observations/Objective:   Gen: Awake, alert, no acute distress Resp: Breathing sounds even and non-labored Psych: calm/pleasant demeanor Neuro: Alert and Oriented x 3,  speech is clear.   Assessment and Plan:  SOB- brief and followed caffeine consumption.  Advised pt to discontinue caffeine and to call if recurrent symptoms occur. If severe symptoms occur he is advised to go to the er.   Follow Up Instructions:    I discussed the assessment and treatment plan with the patient. The patient was provided an opportunity to ask questions and all were answered. The patient agreed with the plan and demonstrated an understanding of the instructions.   The patient was advised to call back or seek an in-person evaluation if the symptoms worsen or if the condition fails to improve as anticipated.  I provided 11 minutes of  non-face-to-face time during this encounter.   Nance Pear, NP

## 2018-12-15 ENCOUNTER — Encounter (HOSPITAL_COMMUNITY): Payer: Self-pay | Admitting: *Deleted

## 2018-12-15 ENCOUNTER — Emergency Department (HOSPITAL_COMMUNITY): Payer: Medicare Other

## 2018-12-15 ENCOUNTER — Inpatient Hospital Stay (HOSPITAL_COMMUNITY)
Admission: EM | Admit: 2018-12-15 | Discharge: 2018-12-17 | DRG: 291 | Disposition: A | Discharge status: Home / Self Care | Payer: Medicare Other | Attending: Internal Medicine | Admitting: Internal Medicine

## 2018-12-15 ENCOUNTER — Inpatient Hospital Stay (HOSPITAL_COMMUNITY): Payer: Medicare Other

## 2018-12-15 ENCOUNTER — Other Ambulatory Visit: Payer: Self-pay

## 2018-12-15 DIAGNOSIS — I428 Other cardiomyopathies: Secondary | ICD-10-CM | POA: Diagnosis present

## 2018-12-15 DIAGNOSIS — E669 Obesity, unspecified: Secondary | ICD-10-CM | POA: Diagnosis present

## 2018-12-15 DIAGNOSIS — I5023 Acute on chronic systolic (congestive) heart failure: Secondary | ICD-10-CM | POA: Diagnosis not present

## 2018-12-15 DIAGNOSIS — I130 Hypertensive heart and chronic kidney disease with heart failure and stage 1 through stage 4 chronic kidney disease, or unspecified chronic kidney disease: Principal | ICD-10-CM | POA: Diagnosis present

## 2018-12-15 DIAGNOSIS — Z7982 Long term (current) use of aspirin: Secondary | ICD-10-CM

## 2018-12-15 DIAGNOSIS — R739 Hyperglycemia, unspecified: Secondary | ICD-10-CM | POA: Diagnosis present

## 2018-12-15 DIAGNOSIS — N182 Chronic kidney disease, stage 2 (mild): Secondary | ICD-10-CM | POA: Diagnosis present

## 2018-12-15 DIAGNOSIS — I2510 Atherosclerotic heart disease of native coronary artery without angina pectoris: Secondary | ICD-10-CM | POA: Diagnosis present

## 2018-12-15 DIAGNOSIS — Z6832 Body mass index (BMI) 32.0-32.9, adult: Secondary | ICD-10-CM | POA: Diagnosis not present

## 2018-12-15 DIAGNOSIS — Z20828 Contact with and (suspected) exposure to other viral communicable diseases: Secondary | ICD-10-CM | POA: Diagnosis present

## 2018-12-15 DIAGNOSIS — R000 Tachycardia, unspecified: Secondary | ICD-10-CM | POA: Diagnosis not present

## 2018-12-15 DIAGNOSIS — E785 Hyperlipidemia, unspecified: Secondary | ICD-10-CM | POA: Diagnosis not present

## 2018-12-15 DIAGNOSIS — I5020 Unspecified systolic (congestive) heart failure: Secondary | ICD-10-CM | POA: Diagnosis not present

## 2018-12-15 DIAGNOSIS — I959 Hypotension, unspecified: Secondary | ICD-10-CM | POA: Diagnosis not present

## 2018-12-15 DIAGNOSIS — Z87891 Personal history of nicotine dependence: Secondary | ICD-10-CM

## 2018-12-15 DIAGNOSIS — R778 Other specified abnormalities of plasma proteins: Secondary | ICD-10-CM

## 2018-12-15 DIAGNOSIS — G4733 Obstructive sleep apnea (adult) (pediatric): Secondary | ICD-10-CM | POA: Diagnosis present

## 2018-12-15 DIAGNOSIS — I491 Atrial premature depolarization: Secondary | ICD-10-CM | POA: Diagnosis not present

## 2018-12-15 DIAGNOSIS — R0603 Acute respiratory distress: Secondary | ICD-10-CM | POA: Diagnosis present

## 2018-12-15 DIAGNOSIS — J810 Acute pulmonary edema: Secondary | ICD-10-CM | POA: Diagnosis not present

## 2018-12-15 DIAGNOSIS — I10 Essential (primary) hypertension: Secondary | ICD-10-CM | POA: Diagnosis not present

## 2018-12-15 DIAGNOSIS — Z79899 Other long term (current) drug therapy: Secondary | ICD-10-CM

## 2018-12-15 DIAGNOSIS — I248 Other forms of acute ischemic heart disease: Secondary | ICD-10-CM | POA: Diagnosis present

## 2018-12-15 DIAGNOSIS — R0689 Other abnormalities of breathing: Secondary | ICD-10-CM | POA: Diagnosis not present

## 2018-12-15 DIAGNOSIS — N179 Acute kidney failure, unspecified: Secondary | ICD-10-CM | POA: Diagnosis present

## 2018-12-15 DIAGNOSIS — J9601 Acute respiratory failure with hypoxia: Secondary | ICD-10-CM | POA: Diagnosis present

## 2018-12-15 DIAGNOSIS — N189 Chronic kidney disease, unspecified: Secondary | ICD-10-CM | POA: Diagnosis not present

## 2018-12-15 DIAGNOSIS — R7989 Other specified abnormal findings of blood chemistry: Secondary | ICD-10-CM | POA: Diagnosis not present

## 2018-12-15 DIAGNOSIS — R0602 Shortness of breath: Secondary | ICD-10-CM | POA: Diagnosis not present

## 2018-12-15 LAB — CBC WITH DIFFERENTIAL/PLATELET
Abs Immature Granulocytes: 0.02 10*3/uL (ref 0.00–0.07)
Basophils Absolute: 0 10*3/uL (ref 0.0–0.1)
Basophils Relative: 1 %
Eosinophils Absolute: 0.2 10*3/uL (ref 0.0–0.5)
Eosinophils Relative: 4 %
HCT: 51 % (ref 39.0–52.0)
Hemoglobin: 16.5 g/dL (ref 13.0–17.0)
Immature Granulocytes: 0 %
Lymphocytes Relative: 43 %
Lymphs Abs: 2.8 10*3/uL (ref 0.7–4.0)
MCH: 28.7 pg (ref 26.0–34.0)
MCHC: 32.4 g/dL (ref 30.0–36.0)
MCV: 88.9 fL (ref 80.0–100.0)
Monocytes Absolute: 0.7 10*3/uL (ref 0.1–1.0)
Monocytes Relative: 11 %
Neutro Abs: 2.6 10*3/uL (ref 1.7–7.7)
Neutrophils Relative %: 41 %
Platelets: 165 10*3/uL (ref 150–400)
RBC: 5.74 MIL/uL (ref 4.22–5.81)
RDW: 14.1 % (ref 11.5–15.5)
WBC: 6.5 10*3/uL (ref 4.0–10.5)
nRBC: 0 % (ref 0.0–0.2)

## 2018-12-15 LAB — BASIC METABOLIC PANEL
Anion gap: 8 (ref 5–15)
BUN: 19 mg/dL (ref 8–23)
CO2: 26 mmol/L (ref 22–32)
Calcium: 9.2 mg/dL (ref 8.9–10.3)
Chloride: 105 mmol/L (ref 98–111)
Creatinine, Ser: 1.49 mg/dL — ABNORMAL HIGH (ref 0.61–1.24)
GFR calc Af Amer: 54 mL/min — ABNORMAL LOW (ref >60)
GFR calc non Af Amer: 47 mL/min — ABNORMAL LOW (ref >60)
Glucose, Bld: 209 mg/dL — ABNORMAL HIGH (ref 70–99)
Potassium: 4.3 mmol/L (ref 3.5–5.1)
Sodium: 139 mmol/L (ref 135–145)

## 2018-12-15 LAB — SARS CORONAVIRUS 2 BY RT PCR (HOSPITAL ORDER, PERFORMED IN ~~LOC~~ HOSPITAL LAB): SARS Coronavirus 2: NEGATIVE

## 2018-12-15 LAB — TROPONIN I (HIGH SENSITIVITY)
Troponin I (High Sensitivity): 35 ng/L — ABNORMAL HIGH (ref <18)
Troponin I (High Sensitivity): 52 ng/L — ABNORMAL HIGH (ref <18)

## 2018-12-15 LAB — ECHOCARDIOGRAM COMPLETE
Height: 69 in
Weight: 3568 oz

## 2018-12-15 LAB — BRAIN NATRIURETIC PEPTIDE: B Natriuretic Peptide: 453.3 pg/mL — ABNORMAL HIGH (ref 0.0–100.0)

## 2018-12-15 MED ORDER — DIPHENHYDRAMINE HCL 25 MG PO TABS
50.0000 mg | ORAL_TABLET | Freq: Four times a day (QID) | ORAL | Status: DC | PRN
  Filled 2018-12-15: qty 2

## 2018-12-15 MED ORDER — ALBUTEROL SULFATE (2.5 MG/3ML) 0.083% IN NEBU: 2.5000 mg | INHALATION_SOLUTION | RESPIRATORY_TRACT | Status: DC | PRN

## 2018-12-15 MED ORDER — ONDANSETRON HCL 4 MG/2ML IJ SOLN: 4.0000 mg | Freq: Four times a day (QID) | INTRAMUSCULAR | Status: DC | PRN

## 2018-12-15 MED ORDER — ENOXAPARIN SODIUM 40 MG/0.4ML ~~LOC~~ SOLN
40.0000 mg | SUBCUTANEOUS | Status: DC
  Administered 2018-12-15 – 2018-12-16 (×2): 40 mg via SUBCUTANEOUS
  Filled 2018-12-15 (×2): qty 0.4

## 2018-12-15 MED ORDER — ATORVASTATIN CALCIUM 10 MG PO TABS
10.0000 mg | ORAL_TABLET | Freq: Every day | ORAL | Status: DC
  Administered 2018-12-16: 10 mg via ORAL
  Filled 2018-12-15: qty 1

## 2018-12-15 MED ORDER — POTASSIUM CHLORIDE CRYS ER 10 MEQ PO TBCR
10.0000 meq | EXTENDED_RELEASE_TABLET | Freq: Every day | ORAL | Status: DC
  Administered 2018-12-16 – 2018-12-17 (×2): 10 meq via ORAL
  Filled 2018-12-15 (×2): qty 1

## 2018-12-15 MED ORDER — NITROGLYCERIN IN D5W 200-5 MCG/ML-% IV SOLN
0.0000 ug/min | INTRAVENOUS | Status: DC
  Administered 2018-12-15: 10 ug/min via INTRAVENOUS
  Filled 2018-12-15: qty 250

## 2018-12-15 MED ORDER — SODIUM CHLORIDE 0.9 % IV SOLN: 250.0000 mL | INTRAVENOUS | Status: DC | PRN

## 2018-12-15 MED ORDER — FUROSEMIDE 10 MG/ML IJ SOLN
40.0000 mg | Freq: Two times a day (BID) | INTRAMUSCULAR | Status: DC
  Administered 2018-12-15 – 2018-12-16 (×3): 40 mg via INTRAVENOUS
  Filled 2018-12-15 (×3): qty 4

## 2018-12-15 MED ORDER — SODIUM CHLORIDE 0.9% FLUSH
3.0000 mL | Freq: Two times a day (BID) | INTRAVENOUS | Status: DC
  Administered 2018-12-15 – 2018-12-16 (×4): 3 mL via INTRAVENOUS

## 2018-12-15 MED ORDER — ACETAMINOPHEN 325 MG PO TABS: 650.0000 mg | ORAL_TABLET | ORAL | Status: DC | PRN

## 2018-12-15 MED ORDER — SODIUM CHLORIDE 0.9% FLUSH: 3.0000 mL | INTRAVENOUS | Status: DC | PRN

## 2018-12-15 MED ORDER — ASPIRIN EC 81 MG PO TBEC
81.0000 mg | DELAYED_RELEASE_TABLET | Freq: Every day | ORAL | Status: DC
  Administered 2018-12-16 – 2018-12-17 (×2): 81 mg via ORAL
  Filled 2018-12-15 (×2): qty 1

## 2018-12-15 MED ORDER — FUROSEMIDE 10 MG/ML IJ SOLN
40.0000 mg | Freq: Once | INTRAMUSCULAR | Status: AC
  Administered 2018-12-15: 40 mg via INTRAVENOUS
  Filled 2018-12-15: qty 4

## 2018-12-15 NOTE — Progress Notes (Signed)
RT was called by RN requesting a new CPAP mask because pt had vomited while on CPAP. RT made RN aware that patient can not go back on CPAP. RT will monitor as needed throughout the night.

## 2018-12-15 NOTE — ED Notes (Signed)
6E unable to take report at this time.  

## 2018-12-15 NOTE — Progress Notes (Signed)
  Echocardiogram 2D Echocardiogram has been performed.  Jannett Celestine 12/15/2018, 1:46 PM

## 2018-12-15 NOTE — ED Notes (Signed)
Per MD maintain MAP of 65 or greater.

## 2018-12-15 NOTE — ED Notes (Signed)
ED TO INPATIENT HANDOFF REPORT  ED Nurse Name and Phone #:  Virgie Kunda/Nicole [phone]  S Name/Age/Gender [name] 73 y.o. male Room/Bed: 027C/027C  Code Status   Code Status: Full Code  Home/SNF/Other Home Patient oriented to: self, place, time and situation Is this baseline? Yes   Triage Complete: Triage complete  Chief Complaint sick  Triage Note Pt from work c/o sudden onset of SOB, hypertensive on arrival.0.3 epi, 125mg  solumedrol given for absent lung sounds enroute. Pt diaphoretic, very restless. Dr.Glick to bedside on arrival. RT to bedside as well    Allergies Allergies  Allergen Reactions  . Lisinopril Swelling    ANGIOEDEMA    Level of Care/Admitting Diagnosis ED Disposition    ED Disposition Condition Comment   Admit  [facility] [[id number]]  Level of Care: Progressive [102]  Covid Evaluation: Asymptomatic Screening Protocol (No Symptoms)  Diagnosis: Systolic congestive heart failure [facility](HCC) [[id number]]) [[id number]]  Admitting Physician: Clydie BraunSMITH, RONDELL A [[id number]]  Attending Physician: Clydie BraunSMITH, RONDELL A [[id number]]  Estimated length of stay: past midnight tomorrow  Certification:: I certify this patient will need inpatient services for at least 2 midnights  PT Class (Do Not Modify): Inpatient [101]  PT Acc Code (Do Not Modify): Private [1]       B Medical/Surgery History Past Medical History:  Diagnosis Date  . Anomalous right coronary artery   . Chronic systolic CHF (congestive heart failure) (HCC) 08/18/2016   Echo 1/17:  Diff HK especially in inf wall, mild LVH, EF 30-35, mild AS (mean 10, peak 18) // Echo 11/15: Mild LVH, EF 30-35, moderate HK, Gr 1 DD, mild AI, mild RAE // Echo 8/18: Mild LVH, EF 30-35, diffuse HK, mild aortic stenosis (mean 7), mild AI, mild BAE  . Coronary artery calcification 08/18/2016   Cor CTA 1/08: Ca score 248 // LHC 1/08: oLAD 25%  . History of ETOH abuse   . Hyperlipidemia 08/27/2013  . Hypertension   .  Non-ischemic cardiomyopathy (HCC)    Presumed secondary to Etoh and HTN  (Prev EF 20-25% -- improved to 50%) // LHC 1/08: oLAD 25, EF 25%   . OSA (obstructive sleep apnea) 10/17/2013   Severe per home sleep study performed on 09/26/13.    History reviewed. No pertinent surgical history.   A IV Location/Drains/Wounds Patient Lines/Drains/Airways Status   Active Line/Drains/Airways    Name:   Placement date:   Placement time:   Site:   Days:   Peripheral IV 12/15/18 Left Antecubital   12/15/18    0623    Antecubital   less than 1   Peripheral IV 12/15/18 Right Hand   12/15/18    0630    Hand   less than 1          Intake/Output Last 24 hours  Intake/Output Summary (Last 24 hours) at 12/15/2018 1312 Last data filed at 12/15/2018 [id number] Gross per 24 hour  Intake -  Output 600 ml  Net -600 ml    Labs/Imaging Results for orders placed or performed during the hospital encounter of 12/15/18 (from the past 48 hour(s))  Basic metabolic panel     Status: Abnormal   Collection Time: 12/15/18  6:13 AM  Result Value Ref Range   Sodium 139 135 - 145 mmol/L   Potassium 4.3 3.5 - 5.1 mmol/L   Chloride 105 98 - 111 mmol/L   CO2 26 22 - 32 mmol/L   Glucose, Bld 209 (H) 70 - 99 mg/dL  BUN 19 8 - 23 mg/dL   Creatinine, Ser 4.09 (H) 0.61 - 1.24 mg/dL   Calcium 9.2 8.9 - 81.1 mg/dL   GFR calc non Af Amer 47 (L) >60 mL/min   GFR calc Af Amer 54 (L) >60 mL/min   Anion gap 8 5 - 15    Comment: Performed at [facility], [address] [place], [place] [address]  Brain natriuretic peptide     Status: Abnormal   Collection Time: 12/15/18  6:13 AM  Result Value Ref Range   B Natriuretic Peptide 453.3 (H) 0.0 - 100.0 pg/mL    Comment: Performed at [facility], [address] [place], [place] [address]  Troponin I (High Sensitivity)     Status: Abnormal   Collection Time: 12/15/18  6:13 AM  Result Value Ref Range   Troponin I (High Sensitivity) 35 (H) <18 ng/L    Comment:  (NOTE) Elevated high sensitivity troponin I (hsTnI) values and significant  changes across serial measurements may suggest ACS but many other  chronic and acute conditions are known to elevate hsTnI results.  Refer to the "Links" section for chest pain algorithms and additional  guidance. Performed at [facility], [address] [place], [place] [address]   CBC with Differential     Status: None   Collection Time: 12/15/18  6:13 AM  Result Value Ref Range   WBC 6.5 4.0 - 10.5 K/uL   RBC 5.74 4.22 - 5.81 MIL/uL   Hemoglobin 16.5 13.0 - 17.0 g/dL   HCT 57.8 46.9 - 62.9 %   MCV 88.9 80.0 - 100.0 fL   MCH 28.7 26.0 - 34.0 pg   MCHC 32.4 30.0 - 36.0 g/dL   RDW 52.8 41.3 - 24.4 %   Platelets 165 150 - 400 K/uL   nRBC 0.0 0.0 - 0.2 %   Neutrophils Relative % 41 %   Neutro Abs 2.6 1.7 - 7.7 K/uL   Lymphocytes Relative 43 %   Lymphs Abs 2.8 0.7 - 4.0 K/uL   Monocytes Relative 11 %   Monocytes Absolute 0.7 0.1 - 1.0 K/uL   Eosinophils Relative 4 %   Eosinophils Absolute 0.2 0.0 - 0.5 K/uL   Basophils Relative 1 %   Basophils Absolute 0.0 0.0 - 0.1 K/uL   Immature Granulocytes 0 %   Abs Immature Granulocytes 0.02 0.00 - 0.07 K/uL    Comment: Performed at [facility], [address] [place], [place] [address]  SARS Coronavirus 2 [facility] order, Performed in [facility] hospital lab) Nasopharyngeal Nasopharyngeal Swab     Status: None   Collection Time: 12/15/18  7:36 AM   Specimen: Nasopharyngeal Swab  Result Value Ref Range   SARS Coronavirus 2 NEGATIVE NEGATIVE    Comment: (NOTE) If result is NEGATIVE SARS-CoV-2 target nucleic acids are NOT DETECTED. The SARS-CoV-2 RNA is generally detectable in upper and lower  respiratory specimens during the acute phase of infection. The lowest  concentration of SARS-CoV-2 viral copies this assay can detect is 250  copies / mL. A negative result does not preclude SARS-CoV-2 infection  and should not be used as the sole basis  for treatment or other  patient management decisions.  A negative result may occur with  improper specimen collection / handling, submission of specimen other  than nasopharyngeal swab, presence of viral mutation(s) within the  areas targeted by this assay, and inadequate number of viral copies  (<250 copies /  mL). A negative result must be combined with clinical  observations, patient history, and epidemiological information. If result is POSITIVE SARS-CoV-2 target nucleic acids are DETECTED. The SARS-CoV-2 RNA is generally detectable in upper and lower  respiratory specimens dur ing the acute phase of infection.  Positive  results are indicative of active infection with SARS-CoV-2.  Clinical  correlation with patient history and other diagnostic information is  necessary to determine patient infection status.  Positive results do  not rule out bacterial infection or co-infection with other viruses. If result is PRESUMPTIVE POSTIVE SARS-CoV-2 nucleic acids MAY BE PRESENT.   A presumptive positive result was obtained on the submitted specimen  and confirmed on repeat testing.  While 2019 novel coronavirus  (SARS-CoV-2) nucleic acids may be present in the submitted sample  additional confirmatory testing may be necessary for epidemiological  and / or clinical management purposes  to differentiate between  SARS-CoV-2 and other Sarbecovirus currently known to infect humans.  If clinically indicated additional testing with an alternate test  methodology [phone]([id number]) is advised. The SARS-CoV-2 RNA is generally  detectable in upper and lower respiratory sp ecimens during the acute  phase of infection. The expected result is Negative. Fact Sheet for Patients:  BoilerBrush.com.cyhttps://www.fda.gov/media/136312/download Fact Sheet for Healthcare Providers: https://pope.com/https://www.fda.gov/media/136313/download This test is not yet approved or cleared by the [place] FDA and has been authorized for detection and/or  diagnosis of SARS-CoV-2 by FDA under an Emergency Use Authorization (EUA).  This EUA will remain in effect (meaning this test can be used) for the duration of the COVID-19 declaration under Section 564(b)(1) of the Act, 21 U.S.C. section 360bbb-3(b)(1), unless the authorization is terminated or revoked sooner. Performed at [facility], [address] [place], [place] [address]   Troponin I (High Sensitivity)     Status: Abnormal   Collection Time: 12/15/18  8:13 AM  Result Value Ref Range   Troponin I (High Sensitivity) 52 (H) <18 ng/L    Comment: (NOTE) Elevated high sensitivity troponin I (hsTnI) values and significant  changes across serial measurements may suggest ACS but many other  chronic and acute conditions are known to elevate hsTnI results.  Refer to the "Links" section for chest pain algorithms and additional  guidance. Performed at [facility], [address] [place], [place] [address]    Dg Chest Port 1 View  Result Date: 12/15/2018 CLINICAL DATA:  Sudden onset of shortness of breath EXAM: PORTABLE CHEST 1 VIEW COMPARISON:  04/26/2006 FINDINGS: Cardiac shadow is enlarged but stable. Aortic calcifications are noted. Vascular congestion with mild interstitial edema is seen. No sizable effusion is noted. No bony abnormality is seen. IMPRESSION: Changes consistent with mild CHF. Electronically Signed   By: Alcide CleverMark  Lukens M.D.   On: 12/15/2018 06:38    Pending Labs Unresulted Labs (From admission, onward)    Start     Ordered   12/16/18 0500  Basic metabolic panel  Daily,   R     12/15/18 0758   12/16/18 0500  CBC with Differential/Platelet  Tomorrow morning,   R     12/15/18 0758          Vitals/Pain Today's Vitals   12/15/18 1115 12/15/18 1215 12/15/18 1245 12/15/18 1304  BP: 107/66 101/69 101/68   Pulse: 62 64 64 62  Resp: 15 14 14 16   Temp:      TempSrc:      SpO2: 100% 99% 98% 96%  Weight:  Height:      PainSc:        Isolation  Precautions Airborne and Contact precautions  Medications Medications  sodium chloride flush (NS) 0.9 % injection 3 mL (3 mLs Intravenous Given 12/15/18 0945)  sodium chloride flush (NS) 0.9 % injection 3 mL (has no administration in time range)  0.9 %  sodium chloride infusion (has no administration in time range)  acetaminophen (TYLENOL) tablet 650 mg (has no administration in time range)  ondansetron (ZOFRAN) injection 4 mg (has no administration in time range)  enoxaparin (LOVENOX) injection 40 mg (40 mg Subcutaneous Given 12/15/18 0944)  furosemide (LASIX) injection 40 mg (has no administration in time range)  albuterol (PROVENTIL) (2.5 MG/3ML) 0.083% nebulizer solution 2.5 mg (has no administration in time range)  furosemide (LASIX) injection 40 mg (40 mg Intravenous Given 12/15/18 0923)    Mobility walks Moderate fall risk   Focused Assessments Pulmonary Assessment Handoff:  Lung sounds: Bilateral Breath Sounds: Diminished, Rhonchi L Breath Sounds: Coarse crackles R Breath Sounds: Coarse crackles O2 Device: Bi-PAP        R Recommendations: See Admitting Provider Note  Report given to:   Additional Notes:

## 2018-12-15 NOTE — H&P (Signed)
History and Physical    [name]:[id number] DOB: 10-20-1945 DOA: 12/15/2018  Referring MD/NP/PA: Delora Fuel, MD PCP: Debbrah Alar, NP  Patient coming from: From his job via EMS  Chief Complaint: Shortness of breath  I have personally briefly reviewed patient's old medical records in [place]   HPI: [name] is a 73 y.o. male with medical history significant of CHF last EF 30-35%, HTN, HLD, CAD, and OSA on CPAP; who presents with acute onset of shortness of breath around 2-3 AM while he was at work. History is somewhat limited as patient currently on BiPAP.  He reports taking all of his medications as prescribed.  Unable to try anything to relieve symptoms as they progressed quickly.  Denies significant change in weight, leg swelling, calf pain, chest pain, cough, fever, chills, nausea, vomiting, or diarrhea.   En route with EMS he was given epinephrine and Solu-Medrol.  ED Course: Upon admission to the emergency department patient was noted to have a temperature of 100 F, pulse up to 115, respirations 23-37, blood pressures initially noted to be stable, and O2 saturations maintained on BiPAP.  Labs significant for creatinine 1.49, BNP 453.3, and troponin 35.  Consistent with mild CHF.  Patient was given 40 mg of Lasix and started on a nitroglycerin drip, but was discontinued after patient developed hypotension with blood pressure as low as 87/70.  TRH called to admit.  Review of systems: A complete 10 point review of systems was performed and negative except for as noted above in HPI.  Past Medical History:  Diagnosis Date  . Anomalous right coronary artery   . Chronic systolic CHF (congestive heart failure) ([place]) 08/18/2016   Echo 1/17:  Diff HK especially in inf wall, mild LVH, EF 30-35, mild AS (mean 10, peak 18) // Echo 11/15: Mild LVH, EF 30-35, moderate HK, Gr 1 DD, mild AI, mild RAE // Echo 8/18: Mild LVH, EF 30-35, diffuse HK, mild aortic stenosis (mean  7), mild AI, mild BAE  . Coronary artery calcification 08/18/2016   Cor CTA 1/08: Ca score 248 // LHC 1/08: oLAD 25%  . History of ETOH abuse   . Hyperlipidemia 08/27/2013  . Hypertension   . Non-ischemic cardiomyopathy ([place])    Presumed secondary to Etoh and HTN  (Prev EF 20-25% -- improved to 50%) // LHC 1/08: oLAD 25, EF 25%   . OSA (obstructive sleep apnea) 10/17/2013   Severe per home sleep study performed on 09/26/13.     History reviewed. No pertinent surgical history.   reports that he quit smoking about 16 years ago. He quit after 20.00 years of use. He has never used smokeless tobacco. He reports current alcohol use of about 7.0 standard drinks of alcohol per week. He reports that he does not use drugs.  Allergies  Allergen Reactions  . Lisinopril Swelling    ANGIOEDEMA    Family History  Problem Relation Age of Onset  . Alcohol abuse Mother   . Heart disease Mother   . Alcohol abuse Father   . Heart failure Father   . Heart disease Maternal Grandmother   . Stroke Maternal Grandmother   . Diabetes Neg Hx     Prior to Admission medications   Medication Sig Start Date End Date Taking? Authorizing Provider  aspirin EC 81 MG tablet Take 1 tablet (81 mg total) by mouth daily. 08/18/16   Richardson Dopp T, PA-C  atorvastatin (LIPITOR) 10 MG tablet TAKE 1  TABLET BY MOUTH ONCE DAILY AT  6  00  PM 07/25/18   Pricilla Riffleoss, Paula V, MD  carvedilol (COREG) 25 MG tablet Take 1 tablet by mouth twice daily 11/08/18   Sandford Craze'Sullivan, Melissa, NP  diphenhydrAMINE (BENADRYL) 25 MG tablet Take 50 mg by mouth every 6 (six) hours as needed.    [provider]  furosemide (LASIX) 20 MG tablet Take 1 tablet (20 mg total) by mouth daily. 01/10/18   Tereso NewcomerWeaver, Scott T, PA-C  hydrALAZINE (APRESOLINE) 10 MG tablet TAKE 1 TABLET BY MOUTH THREE TIMES DAILY 11/08/18   Pricilla Riffleoss, Paula V, MD  isosorbide mononitrate (IMDUR) 30 MG 24 hr tablet Take 1 tablet (30 mg total) by mouth daily. 01/10/18   Tereso NewcomerWeaver, Scott T, PA-C   potassium chloride (K-DUR) 10 MEQ tablet Take 1 tablet by mouth once daily 11/08/18   Sandford Craze'Sullivan, Melissa, NP  spironolactone (ALDACTONE) 25 MG tablet Take 1/2 (one-half) tablet by mouth once daily 07/25/18   Pricilla Riffleoss, Paula V, MD  Zoster Vaccine Adjuvanted [facility](SHINGRIX) injection Inject 0.5mg  IM now and again in 2-6 months. 11/28/18   Sandford Craze'Sullivan, Melissa, NP    Physical Exam:  Constitutional: Obese male who appears to be somewhat more comfortable currently on BiPAP Vitals:   12/15/18 0700 12/15/18 0714 12/15/18 0723 12/15/18 0727  BP: 91/63 91/63 (!) 91/49 94/64  Pulse: 78 80 72 76  Resp: (!) 29 (!) 32 (!) 23 (!) 27  Temp:      TempSrc:      SpO2: 100% 95% 100% 98%  Weight:      Height:       Eyes: PERRL, lids and conjunctivae normal ENMT: Mucous membranes are moist. Posterior pharynx clear of any exudate or lesions.  Neck: normal, supple, no masses, no thyromegaly.  No significant JVD appreciated. Respiratory: Tachypneic with positive crackles heard in the mid and lower lung fields.  No significant wheezing appreciated. Cardiovascular: Regular rate and rhythm, no murmurs / rubs / gallops.  Trace to 1+ lower extremity edema. 2+ pedal pulses. No carotid bruits.  Abdomen: no tenderness, no masses palpated. No hepatosplenomegaly. Bowel sounds positive.  Musculoskeletal: no clubbing / cyanosis. No joint deformity upper and lower extremities. Good ROM, no contractures. Normal muscle tone.  Skin: no rashes, lesions, ulcers. No induration Neurologic: CN 2-12 grossly intact. Sensation intact, DTR normal. Strength 5/5 in all 4.  Psychiatric: Normal judgment and insight. Alert and oriented x 3. Normal mood.     Labs on Admission: I have personally reviewed following labs and imaging studies  CBC: Recent Labs  Lab 12/15/18 0613  WBC 6.5  NEUTROABS 2.6  HGB 16.5  HCT 51.0  MCV 88.9  PLT 165   Basic Metabolic Panel: Recent Labs  Lab 12/15/18 0613  NA 139  K 4.3  CL 105  CO2 26   GLUCOSE 209*  BUN 19  CREATININE 1.49*  CALCIUM 9.2   GFR: Estimated Creatinine Clearance: 53.3 mL/min (A) (by C-G formula based on SCr of 1.49 mg/dL (H)). Liver Function Tests: No results for input(s): AST, ALT, ALKPHOS, BILITOT, PROT, ALBUMIN in the last 168 hours. No results for input(s): LIPASE, AMYLASE in the last 168 hours. No results for input(s): AMMONIA in the last 168 hours. Coagulation Profile: No results for input(s): INR, PROTIME in the last 168 hours. Cardiac Enzymes: No results for input(s): CKTOTAL, CKMB, CKMBINDEX, TROPONINI in the last 168 hours. BNP (last 3 results) No results for input(s): PROBNP in the last 8760 hours. HbA1C: No results for  input(s): HGBA1C in the last 72 hours. CBG: No results for input(s): GLUCAP in the last 168 hours. Lipid Profile: No results for input(s): CHOL, HDL, LDLCALC, TRIG, CHOLHDL, LDLDIRECT in the last 72 hours. Thyroid Function Tests: No results for input(s): TSH, T4TOTAL, FREET4, T3FREE, THYROIDAB in the last 72 hours. Anemia Panel: No results for input(s): VITAMINB12, FOLATE, FERRITIN, TIBC, IRON, RETICCTPCT in the last 72 hours. Urine analysis: No results found for: COLORURINE, APPEARANCEUR, LABSPEC, PHURINE, GLUCOSEU, HGBUR, BILIRUBINUR, KETONESUR, PROTEINUR, UROBILINOGEN, NITRITE, LEUKOCYTESUR Sepsis Labs: No results found for this or any previous visit (from the past 240 hour(s)).   Radiological Exams on Admission: Dg Chest Port 1 View  Result Date: 12/15/2018 CLINICAL DATA:  Sudden onset of shortness of breath EXAM: PORTABLE CHEST 1 VIEW COMPARISON:  04/26/2006 FINDINGS: Cardiac shadow is enlarged but stable. Aortic calcifications are noted. Vascular congestion with mild interstitial edema is seen. No sizable effusion is noted. No bony abnormality is seen. IMPRESSION: Changes consistent with mild CHF. Electronically Signed   By: Alcide Clever M.D.   On: 12/15/2018 06:38    EKG: Independently reviewed.  Sinus tachycardia  at 100 bpm  Assessment/Plan Acute respiratory distress secondary to systolic congestive heart failure exacerbation: On admission patient was noted to have significant respiratory distress for which he was placed on BiPAP.  On physical exam patient noted to have crackles in both lung fields.  Chest x-ray revealed mild CHF and BNP 453.3.  High-sensitivity troponins were elevated up to 52, but suspect secondary to demand as patient denies any chest pain complaints.  Last EF noted to be around 30 to 35% in 2018.  Patient was given 40 mg of Lasix IV. - Admit to a telemetry bed - Heart failure orders set  initiated  - Continuous pulse oximetry with nasal cannula oxygen as needed to keep O2 saturations >92% - BiPAP as needed - Strict I&Os and daily weights - Elevate lower extremities - Lasix 40 mg IV Bid - Reassess in a.m. and adjust diuresis as needed. - Check echocardiogram - Consider outpatient follow-up with cardiology versus inpatient evaluation  Transient hypotension: Resolving.  After the start of nitroglycerin drip patient developed acute hypotension with systolic blood pressures dropping into the 80s.  Nitroglycerin drip was discontinued with some improvement blood pressures. - Currently holding home antihypertensives  - Restart home antihypertensives when medically appropriate  Acute kidney injury: On admission creatinine elevated to 1.49 with BUN 19.  Previous baseline creatinine was 1.19 in August. - Continue to monitor kidney function with diuresis   Hyperglycemia: In route with EMS patient had been given epinephrine and Solu-Medrol due to respiratory distress.  Initial blood sugar 209, but patient's last hemoglobin A1c noted to be 6.1 on 11/28/2018.   - Continue to monitor  Hyperlipidemia - Restart atorvastatin when able  OSA on CPAP - Continue CPAP at night  DVT prophylaxis: lovenox  Code Status: Full  Family Communication: Discussed plan of care with the patient's wife over  the phone Disposition Plan: Possible discharge home in 1 to 2 days Consults called: None Admission status: inpatient   Clydie Braun MD [facility] Pager [phone]   If 7PM-7AM, please contact night-coverage [email or web address] Password [facility]  12/15/2018, 7:39 AM

## 2018-12-15 NOTE — ED Notes (Signed)
MD paged by this RN about patient's hypotension.

## 2018-12-15 NOTE — ED Notes (Signed)
Per Dr. Roxanne Mins, turn nitroglycerin drip off due to patient's hypotension.

## 2018-12-15 NOTE — ED Triage Notes (Addendum)
Pt from work c/o sudden onset of SOB, hypertensive on arrival.0.3 epi, 125mg  solumedrol given for absent lung sounds enroute. Pt diaphoretic, very restless. Dr.Glick to bedside on arrival. RT to bedside as well

## 2018-12-15 NOTE — ED Provider Notes (Signed)
[place] EMERGENCY DEPARTMENT Provider Note   CSN: [id number] Arrival date & time: 12/15/18  0609    History   Chief Complaint Chief Complaint  Patient presents with  . Respiratory Distress    HPI [name] is a 73 y.o. male.   The history is provided by the patient and the EMS personnel. The history is limited by the condition of the patient (Severe respiratory distress).  He has history of hypertension, hyperlipidemia, systolic heart failure and is brought in by ambulance with severe respiratory distress.  He states he started getting short of breath at about 2 AM while at work.  He denies chest pain, heaviness, tightness, pressure.  Dyspnea got progressively worse.  EMS reported markedly diminished breath sounds and he was given epinephrine and methylprednisolone in route.  Patient states he has not missed any of his medication doses.  Past Medical History:  Diagnosis Date  . Anomalous right coronary artery   . Chronic systolic CHF (congestive heart failure) ([place]) 08/18/2016   Echo 1/17:  Diff HK especially in inf wall, mild LVH, EF 30-35, mild AS (mean 10, peak 18) // Echo 11/15: Mild LVH, EF 30-35, moderate HK, Gr 1 DD, mild AI, mild RAE // Echo 8/18: Mild LVH, EF 30-35, diffuse HK, mild aortic stenosis (mean 7), mild AI, mild BAE  . Coronary artery calcification 08/18/2016   Cor CTA 1/08: Ca score 248 // LHC 1/08: oLAD 25%  . History of ETOH abuse   . Hyperlipidemia 08/27/2013  . Hypertension   . Non-ischemic cardiomyopathy ([place])    Presumed secondary to Etoh and HTN  (Prev EF 20-25% -- improved to 50%) // LHC 1/08: oLAD 25, EF 25%   . OSA (obstructive sleep apnea) 10/17/2013   Severe per home sleep study performed on 09/26/13.     Patient Active Problem List   Diagnosis Date Noted  . Chronic systolic CHF (congestive heart failure) ([place]) 08/18/2016  . NICM (nonischemic cardiomyopathy) ([place]) 08/18/2016  . Coronary artery calcification 08/18/2016  .  Insomnia 02/02/2016  . Hyperglycemia 04/24/2014  . OSA (obstructive sleep apnea) 10/17/2013  . Hyperlipidemia 08/27/2013  . Need for assessment for sleep apnea 05/01/2013  . Elevated liver function tests 02/24/2011  . General medical examination 02/23/2011  . Erectile dysfunction 09/01/2010  . ABNORMAL EKG 03/21/2008  . Essential hypertension 08/02/2007  . History of cardiovascular disorder 08/02/2007    History reviewed. No pertinent surgical history.      Home Medications    Prior to Admission medications   Medication Sig Start Date End Date Taking? Authorizing Provider  aspirin EC 81 MG tablet Take 1 tablet (81 mg total) by mouth daily. 08/18/16   Richardson Dopp T, PA-C  atorvastatin (LIPITOR) 10 MG tablet TAKE 1 TABLET BY MOUTH ONCE DAILY AT  6  00  PM 07/25/18   Fay Records, MD  carvedilol (COREG) 25 MG tablet Take 1 tablet by mouth twice daily 11/08/18   Debbrah Alar, NP  diphenhydrAMINE (BENADRYL) 25 MG tablet Take 50 mg by mouth every 6 (six) hours as needed.    [provider]  furosemide (LASIX) 20 MG tablet Take 1 tablet (20 mg total) by mouth daily. 01/10/18   Richardson Dopp T, PA-C  hydrALAZINE (APRESOLINE) 10 MG tablet TAKE 1 TABLET BY MOUTH THREE TIMES DAILY 11/08/18   Fay Records, MD  isosorbide mononitrate (IMDUR) 30 MG 24 hr tablet Take 1 tablet (30 mg total) by mouth daily. 01/10/18  Tereso NewcomerWeaver, Scott T, PA-C  potassium chloride (K-DUR) 10 MEQ tablet Take 1 tablet by mouth once daily 11/08/18   Sandford Craze'Sullivan, Melissa, NP  spironolactone (ALDACTONE) 25 MG tablet Take 1/2 (one-half) tablet by mouth once daily 07/25/18   Pricilla Riffleoss, Paula V, MD  Zoster Vaccine Adjuvanted [facility](SHINGRIX) injection Inject 0.5mg  IM now and again in 2-6 months. 11/28/18   Sandford Craze'Sullivan, Melissa, NP    Family History Family History  Problem Relation Age of Onset  . Alcohol abuse Mother   . Heart disease Mother   . Alcohol abuse Father   . Heart failure Father   . Heart disease Maternal  Grandmother   . Stroke Maternal Grandmother   . Diabetes Neg Hx     Social History Social History   Tobacco Use  . Smoking status: Former Smoker    Years: 20.00    Quit date: 04/12/2002    Years since quitting: 16.6  . Smokeless tobacco: Never Used  Substance Use Topics  . Alcohol use: Yes    Alcohol/week: 7.0 standard drinks    Types: 7 Glasses of wine per week    Comment: 1 glass of wine each night.    . Drug use: No    Comment: history of marijuana use     Allergies   Lisinopril   Review of Systems Review of Systems  Unable to perform ROS: Severe respiratory distress     Physical Exam Updated Vital Signs BP 128/69 (BP Location: Right Arm)   Pulse (!) 115   Temp 100 F (37.8 C) (Rectal)   Resp (!) 35   Ht 5\' 9"  (1.753 m)   Wt 101.2 kg   SpO2 97%   BMI 32.93 kg/m   Physical Exam Vitals signs and nursing note reviewed.    73 year old male, diaphoretic and in moderate to severe respiratory distress. Vital signs are significant for rapid heart rate and respiratory rate. Oxygen saturation is 97%, which is normal. Head is normocephalic and atraumatic. PERRLA, EOMI. Oropharynx is clear. Neck is nontender and supple without adenopathy.  JVD is present at 90 degrees.. Back is nontender and there is no CVA tenderness. Lungs have diffuse crackles without wheezes or rhonchi. Chest is nontender. Heart has regular rate and rhythm without murmur. Abdomen is soft, flat, nontender without masses or hepatosplenomegaly and peristalsis is normoactive. Extremities have 1+ edema, full range of motion is present. Skin is warm and dry without rash. Neurologic: Awake but very anxious, unable to speak because of respiratory distress, cranial nerves are intact, there are no motor or sensory deficits.  ED Treatments / Results  Labs (all labs ordered are listed, but only abnormal results are displayed) Labs Reviewed  BASIC METABOLIC PANEL - Abnormal; Notable for the following  components:      Result Value   Glucose, Bld 209 (*)    Creatinine, Ser 1.49 (*)    GFR calc non Af Amer 47 (*)    GFR calc Af Amer 54 (*)    All other components within normal limits  BRAIN NATRIURETIC PEPTIDE - Abnormal; Notable for the following components:   B Natriuretic Peptide 453.3 (*)    All other components within normal limits  TROPONIN I (HIGH SENSITIVITY) - Abnormal; Notable for the following components:   Troponin I (High Sensitivity) 35 (*)    All other components within normal limits  SARS CORONAVIRUS 2 (HOSPITAL ORDER, PERFORMED IN [facility])  CBC WITH DIFFERENTIAL/PLATELET  TROPONIN I (HIGH SENSITIVITY)  EKG EKG Interpretation  Date/Time:  Friday December 15 2018 06:18:48 EDT Ventricular Rate:  100 PR Interval:    QRS Duration: 105 QT Interval:  346 QTC Calculation: 447 R Axis:   -58 Text Interpretation:  Sinus tachycardia Ventricular premature complex Prolonged PR interval Probable left atrial enlargement Inferior infarct, old Lateral leads are also involved When compared with ECG of 04/24/2006, T wave inversion is no longer present Confirmed by Dione Booze ([id number]) on 12/15/2018 6:44:20 AM   Radiology Dg Chest Port 1 View  Result Date: 12/15/2018 CLINICAL DATA:  Sudden onset of shortness of breath EXAM: PORTABLE CHEST 1 VIEW COMPARISON:  04/26/2006 FINDINGS: Cardiac shadow is enlarged but stable. Aortic calcifications are noted. Vascular congestion with mild interstitial edema is seen. No sizable effusion is noted. No bony abnormality is seen. IMPRESSION: Changes consistent with mild CHF. Electronically Signed   By: Alcide Clever M.D.   On: 12/15/2018 06:38    Procedures Procedures  CRITICAL CARE Performed by: Dione Booze Total critical care time: 55 minutes Critical care time was exclusive of separately billable procedures and treating other patients. Critical care was necessary to treat or prevent imminent or life-threatening  deterioration. Critical care was time spent personally by me on the following activities: development of treatment plan with patient and/or surrogate as well as nursing, discussions with consultants, evaluation of patient's response to treatment, examination of patient, obtaining history from patient or surrogate, ordering and performing treatments and interventions, ordering and review of laboratory studies, ordering and review of radiographic studies, pulse oximetry and re-evaluation of patient's condition.  Medications Ordered in ED Medications  nitroGLYCERIN 50 mg in dextrose 5 % 250 mL (0.2 mg/mL) infusion (10 mcg/min Intravenous New Bag/Given 12/15/18 0623)  furosemide (LASIX) injection 40 mg (40 mg Intravenous Given 12/15/18 6967)     Initial Impression / Assessment and Plan / ED Course  I have reviewed the triage vital signs and the nursing notes.  Pertinent labs & imaging results that were available during my care of the patient were reviewed by me and considered in my medical decision making (see chart for details).  Acute respiratory distress which appears to be pulmonary edema based on clinical exam.  Chest x-ray confirms cardiomegaly with interstitial edema.  ECG shows no acute changes.  He is immediately placed on BiPAP and given furosemide and placed on a nitroglycerin drip.  Old records were reviewed, and it is noted that he is being followed by a primary care provider for hypertension and shortness of breath.  Last cardiology visit was 11 months ago.  Last echocardiogram was 2 years ago at which time ejection fraction was 30-35%.  Patient improved dramatically with above-noted treatment.  Blood pressure dropped down into the 90s, so nitroglycerin was discontinued.  Chest x-ray does show mild pulmonary edema.  Labs are significant for acute kidney injury.  Creatinine 1.49 which was 1.19 on August 18.  Troponin is mildly elevated at 55 and will need to be trended.  BNP is moderately  elevated at 453.  Case is discussed with Dr. Katrinka Blazing of [facility] who agrees to admit the patient.  Final Clinical Impressions(s) / ED Diagnoses   Final diagnoses:  Acute pulmonary edema (HCC)  Acute kidney injury (nontraumatic) (HCC)  Elevated troponin    ED Discharge Orders    None       Dione Booze, MD 12/15/18 [phone]

## 2018-12-15 NOTE — ED Provider Notes (Signed)
73 yo male ho ischemic cardiomyopathy last 30-35% 2018 became sob during night. Presentation c,w, chf.  IV nitro and bipap started with improved respiratory status here.    Pattricia Boss, MD 12/15/18 [phone]

## 2018-12-15 NOTE — Progress Notes (Signed)
Discussed with Dr.Glick about patients status. Doctor believes patient is low risk covid and more CHF. Placed patient on bipap with covid test pending.

## 2018-12-15 NOTE — ED Notes (Signed)
Pt placed on 2L via [place]. SpO2 maintained at 98% on 2L. Pt respirations regular and unlabored.

## 2018-12-15 NOTE — ED Notes (Signed)
Pt placed on bipap prior to covid swab; pts skin dry, reports feeling better

## 2018-12-16 ENCOUNTER — Ambulatory Visit: Payer: Medicare Other

## 2018-12-16 LAB — CBC WITH DIFFERENTIAL/PLATELET
Abs Immature Granulocytes: 0.05 10*3/uL (ref 0.00–0.07)
Basophils Absolute: 0 10*3/uL (ref 0.0–0.1)
Basophils Relative: 0 %
Eosinophils Absolute: 0 10*3/uL (ref 0.0–0.5)
Eosinophils Relative: 0 %
HCT: 48.7 % (ref 39.0–52.0)
Hemoglobin: 16.5 g/dL (ref 13.0–17.0)
Immature Granulocytes: 0 %
Lymphocytes Relative: 8 %
Lymphs Abs: 1 10*3/uL (ref 0.7–4.0)
MCH: 28.7 pg (ref 26.0–34.0)
MCHC: 33.9 g/dL (ref 30.0–36.0)
MCV: 84.7 fL (ref 80.0–100.0)
Monocytes Absolute: 1.1 10*3/uL — ABNORMAL HIGH (ref 0.1–1.0)
Monocytes Relative: 9 %
Neutro Abs: 10.6 10*3/uL — ABNORMAL HIGH (ref 1.7–7.7)
Neutrophils Relative %: 83 %
Platelets: 172 10*3/uL (ref 150–400)
RBC: 5.75 MIL/uL (ref 4.22–5.81)
RDW: 13.7 % (ref 11.5–15.5)
WBC: 12.8 10*3/uL — ABNORMAL HIGH (ref 4.0–10.5)
nRBC: 0 % (ref 0.0–0.2)

## 2018-12-16 LAB — BASIC METABOLIC PANEL
Anion gap: 11 (ref 5–15)
BUN: 19 mg/dL (ref 8–23)
CO2: 25 mmol/L (ref 22–32)
Calcium: 9.5 mg/dL (ref 8.9–10.3)
Chloride: 105 mmol/L (ref 98–111)
Creatinine, Ser: 1.21 mg/dL (ref 0.61–1.24)
GFR calc Af Amer: >60 mL/min (ref >60)
GFR calc non Af Amer: 60 mL/min — ABNORMAL LOW (ref >60)
Glucose, Bld: 135 mg/dL — ABNORMAL HIGH (ref 70–99)
Potassium: 4 mmol/L (ref 3.5–5.1)
Sodium: 141 mmol/L (ref 135–145)

## 2018-12-16 NOTE — Progress Notes (Signed)
PROGRESS NOTE    Alan CowboyMack H Gadsden  ZOX:[id number] DOB: 10/01/1945 DOA: 12/15/2018 PCP: Sandford Craze'Sullivan, Melissa, NP    Brief Narrative:   [name] is a 73 y.o. male with medical history significant of CHF last EF 30-35%, HTN, HLD, CAD, and OSA on CPAP; who presents with acute onset of shortness of breath around 2-3 AM while he was at work. History is somewhat limited as patient currently on BiPAP.  He reports taking all of his medications as prescribed.  Unable to try anything to relieve symptoms as they progressed quickly.  Denies significant change in weight, leg swelling, calf pain, chest pain, cough, fever, chills, nausea, vomiting, or diarrhea.   En route with EMS he was given epinephrine and Solu-Medrol.  ED Course: Upon admission to the emergency department patient was noted to have a temperature of 100 F, pulse up to 115, respirations 23-37, blood pressures initially noted to be stable, and O2 saturations maintained on BiPAP.  Labs significant for creatinine 1.49, BNP 453.3, and troponin 35. Consistent with mild CHF.  Patient was given 40 mg of Lasix and started on a nitroglycerin drip, but was discontinued after patient developed hypotension with blood pressure as low as 87/70.  [facility] called to admit.   Assessment & Plan:   Principal Problem:   Systolic congestive heart failure (HCC) Active Problems:   Hyperlipidemia   OSA (obstructive sleep apnea)   Hyperglycemia   Acute kidney injury superimposed on CKD (HCC)   Transient hypotension   Acute respiratory distress   Acute respiratory failure with hypoxia  Acute on chronic systolic congestive heart failure Elevated troponin likely secondary to type II demand ischemia Presented to the ED with significant respiratory distress for which he was placed on BiPAP.  On physical exam patient noted to have crackles in both lung fields.  Chest x-ray revealed mild CHF and BNP elevated at 453.3.  High-sensitivity troponins were elevated up to 52,  but suspect secondary to demand as patient denies any chest pain complaints.  Last EF noted to be around 30 to 35% in 2018. --TTE 9/4 with improved EF 35-40% from previous 30-35% --Continue furosemide 40 mg IV twice daily --Net negative 3.2 L past 24 hours --Now titrated off of BiPAP and supplemental oxygen --Continue strict I's and O's and daily weights --Will likely need increased dose of diuretic on discharge and outpatient follow-up with his primary cardiologist, Dr. Tenny Crawoss following hospitalization.  Transient hypotension: Resolved Patient was noted to have systolic blood pressures in the 80s following initiation of nitroglycerin drip by EDP.  Nitroglycerin drip was discontinued with resolution of his hypotension. --BP this morning 125/79 --Continue to hold home Coreg, Imdur, spironolactone --Continue to monitor BP and restart home antihypertensives as indicated  Acute kidney injury:  On admission creatinine elevated to 1.49 with BUN 19.  Previous baseline creatinine was 1.19 in August. --Cr 1.49-->1.21 --Continue to monitor kidney function with diuresis   Hyperglycemia:  In route with EMS patient had been given epinephrine and Solu-Medrol due to respiratory distress.  Initial blood sugar 209, but patient's last hemoglobin A1c noted to be 6.1 on 11/28/2018.   --Continue to monitor  Hyperlipidemia --Continue home atorvastatin 10 mg p.o. daily  OSA on CPAP --Continue CPAP at night   DVT prophylaxis: Lovenox Code Status: Full code Family Communication: None Disposition Plan: Continue inpatient, continue IV diuresis, close monitoring of respiratory status and blood pressure, anticipate discharge home in 1-2 days   Consultants:   None  Procedures:  BiPAP, now titrated off  TTE 12/15/2018: IMPRESSIONS  1. The left ventricle has moderately reduced systolic function, with an ejection fraction of 35-40%. The cavity size was normal. There is mildly increased left ventricular  wall thickness. Left ventricular diastolic Doppler parameters are consistent with  pseudonormalization. Elevated left atrial and left ventricular end-diastolic pressures The E/e' is >15. Left ventricular diffuse hypokinesis.  2. The mitral valve is grossly normal.  3. The tricuspid valve is grossly normal.  4. The aortic valve is grossly normal.  5. The aorta is normal unless otherwise noted.  6. The inferior vena cava was dilated in size with >50% respiratory variability.  7. The interatrial septum was not well visualized.  8. When compared to the prior study: 11/11/2016: LVEF 30-35%.  Antimicrobials:  None   Subjective: Patient seen and examined at bedside, states breathing is much improved but not back to baseline as of yet.  Has been titrated off of BiPAP and supplemental oxygen overnight.  Continues with nonproductive cough.  Requesting shower.  No other complaints or concerns at this time.  Denies headache, no fever/chills/night sweats, no nausea/vomiting/diarrhea, no chest pain, palpitations, no abdominal pain, no weakness, no paresthesias.  No acute events overnight per nursing staff.  Objective: Vitals:   12/15/18 1440 12/15/18 1938 12/15/18 2100 12/16/18 0500  BP: (!) 136/91 131/77  125/79  Pulse: 86 71 74 77  Resp: 20 18 16 18   Temp: 98.2 F (36.8 C) 98.1 F (36.7 C)  98.4 F (36.9 C)  TempSrc: Oral Oral  Oral  SpO2: 94% 94% 95% 96%  Weight: 97.4 kg   97.4 kg  Height: 5\' 9"  (1.753 m)       Intake/Output Summary (Last 24 hours) at 12/16/2018 1422 Last data filed at 12/16/2018 1112 Gross per 24 hour  Intake 243.33 ml  Output 2800 ml  Net -2556.67 ml   Filed Weights   12/15/18 0631 12/15/18 1440 12/16/18 0500  Weight: 101.2 kg 97.4 kg 97.4 kg    Examination:  General exam: Appears calm and comfortable  Respiratory system: Breath sounds slightly decreased bilateral bases with mild crackles, no wheezes, normal respiratory effort, on room air Cardiovascular system: S1  & S2 heard, RRR. No JVD, murmurs, rubs, gallops or clicks.  Tracing pitting edema bilateral lower extremities to mid shin Gastrointestinal system: Abdomen is nondistended, soft and nontender. No organomegaly or masses felt. Normal bowel sounds heard. Central nervous system: Alert and oriented. No focal neurological deficits. Extremities: Symmetric 5 x 5 power. Skin: No rashes, lesions or ulcers Psychiatry: Judgement and insight appear normal. Mood & affect appropriate.     Data Reviewed: I have personally reviewed following labs and imaging studies  CBC: Recent Labs  Lab 12/15/18 0613 12/16/18 0515  WBC 6.5 12.8*  NEUTROABS 2.6 10.6*  HGB 16.5 16.5  HCT 51.0 48.7  MCV 88.9 84.7  PLT 165 172   Basic Metabolic Panel: Recent Labs  Lab 12/15/18 0613 12/16/18 0515  NA 139 141  K 4.3 4.0  CL 105 105  CO2 26 25  GLUCOSE 209* 135*  BUN 19 19  CREATININE 1.49* 1.21  CALCIUM 9.2 9.5   GFR: Estimated Creatinine Clearance: 64.5 mL/min (by C-G formula based on SCr of 1.21 mg/dL). Liver Function Tests: No results for input(s): AST, ALT, ALKPHOS, BILITOT, PROT, ALBUMIN in the last 168 hours. No results for input(s): LIPASE, AMYLASE in the last 168 hours. No results for input(s): AMMONIA in the last 168 hours. Coagulation Profile: No results  for input(s): INR, PROTIME in the last 168 hours. Cardiac Enzymes: No results for input(s): CKTOTAL, CKMB, CKMBINDEX, TROPONINI in the last 168 hours. BNP (last 3 results) No results for input(s): PROBNP in the last 8760 hours. HbA1C: No results for input(s): HGBA1C in the last 72 hours. CBG: No results for input(s): GLUCAP in the last 168 hours. Lipid Profile: No results for input(s): CHOL, HDL, LDLCALC, TRIG, CHOLHDL, LDLDIRECT in the last 72 hours. Thyroid Function Tests: No results for input(s): TSH, T4TOTAL, FREET4, T3FREE, THYROIDAB in the last 72 hours. Anemia Panel: No results for input(s): VITAMINB12, FOLATE, FERRITIN, TIBC,  IRON, RETICCTPCT in the last 72 hours. Sepsis Labs: No results for input(s): PROCALCITON, LATICACIDVEN in the last 168 hours.  Recent Results (from the past 240 hour(s))  SARS Coronavirus 2 [facility] order, Performed in [facility] hospital lab) Nasopharyngeal Nasopharyngeal Swab     Status: None   Collection Time: 12/15/18  7:36 AM   Specimen: Nasopharyngeal Swab  Result Value Ref Range Status   SARS Coronavirus 2 NEGATIVE NEGATIVE Final    Comment: (NOTE) If result is NEGATIVE SARS-CoV-2 target nucleic acids are NOT DETECTED. The SARS-CoV-2 RNA is generally detectable in upper and lower  respiratory specimens during the acute phase of infection. The lowest  concentration of SARS-CoV-2 viral copies this assay can detect is 250  copies / mL. A negative result does not preclude SARS-CoV-2 infection  and should not be used as the sole basis for treatment or other  patient management decisions.  A negative result may occur with  improper specimen collection / handling, submission of specimen other  than nasopharyngeal swab, presence of viral mutation(s) within the  areas targeted by this assay, and inadequate number of viral copies  (<250 copies / mL). A negative result must be combined with clinical  observations, patient history, and epidemiological information. If result is POSITIVE SARS-CoV-2 target nucleic acids are DETECTED. The SARS-CoV-2 RNA is generally detectable in upper and lower  respiratory specimens dur ing the acute phase of infection.  Positive  results are indicative of active infection with SARS-CoV-2.  Clinical  correlation with patient history and other diagnostic information is  necessary to determine patient infection status.  Positive results do  not rule out bacterial infection or co-infection with other viruses. If result is PRESUMPTIVE POSTIVE SARS-CoV-2 nucleic acids MAY BE PRESENT.   A presumptive positive result was obtained on the submitted specimen   and confirmed on repeat testing.  While 2019 novel coronavirus  (SARS-CoV-2) nucleic acids may be present in the submitted sample  additional confirmatory testing may be necessary for epidemiological  and / or clinical management purposes  to differentiate between  SARS-CoV-2 and other Sarbecovirus currently known to infect humans.  If clinically indicated additional testing with an alternate test  methodology [id number]) is advised. The SARS-CoV-2 RNA is generally  detectable in upper and lower respiratory sp ecimens during the acute  phase of infection. The expected result is Negative. Fact Sheet for Patients:  BoilerBrush.com.cy Fact Sheet for Healthcare Providers: https://pope.com/ This test is not yet approved or cleared by the [place] FDA and has been authorized for detection and/or diagnosis of SARS-CoV-2 by FDA under an Emergency Use Authorization (EUA).  This EUA will remain in effect (meaning this test can be used) for the duration of the COVID-19 declaration under Section 564(b)(1) of the Act, 21 U.S.C. section 360bbb-3(b)(1), unless the authorization is terminated or revoked sooner. Performed at [facility], [address], [place], [place] [address]          Radiology Studies: Dg Chest Port 1 View  Result Date: 12/15/2018 CLINICAL DATA:  Sudden onset of shortness of breath EXAM: PORTABLE CHEST 1 VIEW COMPARISON:  04/26/2006 FINDINGS: Cardiac shadow is enlarged but stable. Aortic calcifications are noted. Vascular congestion with mild interstitial edema is seen. No sizable effusion is noted. No bony abnormality is seen. IMPRESSION: Changes consistent with mild CHF. Electronically Signed   By: Inez Catalina M.D.   On: 12/15/2018 06:38        Scheduled Meds: . aspirin EC  81 mg Oral Daily  . atorvastatin  10 mg Oral q1800  . enoxaparin (LOVENOX) injection  40 mg Subcutaneous Q24H  . furosemide  40 mg  Intravenous BID  . potassium chloride  10 mEq Oral Daily  . sodium chloride flush  3 mL Intravenous Q12H   Continuous Infusions: . sodium chloride       LOS: 1 day    Time spent: 36 minutes spent on chart review, discussion with nursing staff, consultants, personally reviewing all imaging studies and labs, updating family and interview/physical exam; more than 50% of that time was spent in counseling and/or coordination of care.    Arles Rumbold J [place] DO [facility] Pager [phone]  If 7PM-7AM, please contact night-coverage [email or web address] Password [email or web address] 12/16/2018, 2:22 PM

## 2018-12-16 NOTE — Progress Notes (Signed)
Received report from NT, patient vomited while on Cpap. , assessed patient in room, resting on chair at this time,stated  with slight stomach discomforts prior to CPAP use and then  felt nauseated and vomited  few min while on  CPAP . Vital signs stable.  .Denies  abdominal pain nor discomforts at this time , requesting new Cpap mask, risk with symptoms on CPAP made aware, Resp therapist  notified of above.

## 2018-12-17 LAB — BASIC METABOLIC PANEL
Anion gap: 12 (ref 5–15)
BUN: 24 mg/dL — ABNORMAL HIGH (ref 8–23)
CO2: 29 mmol/L (ref 22–32)
Calcium: 9.4 mg/dL (ref 8.9–10.3)
Chloride: 102 mmol/L (ref 98–111)
Creatinine, Ser: 1.32 mg/dL — ABNORMAL HIGH (ref 0.61–1.24)
GFR calc Af Amer: >60 mL/min (ref >60)
GFR calc non Af Amer: 54 mL/min — ABNORMAL LOW (ref >60)
Glucose, Bld: 106 mg/dL — ABNORMAL HIGH (ref 70–99)
Potassium: 3.4 mmol/L — ABNORMAL LOW (ref 3.5–5.1)
Sodium: 143 mmol/L (ref 135–145)

## 2018-12-17 LAB — MAGNESIUM: Magnesium: 2.3 mg/dL (ref 1.7–2.4)

## 2018-12-17 MED ORDER — FUROSEMIDE 40 MG PO TABS: 40.0000 mg | ORAL_TABLET | Freq: Every day | ORAL | 0 refills | Status: DC

## 2018-12-17 MED ORDER — FUROSEMIDE 40 MG PO TABS
40.0000 mg | ORAL_TABLET | Freq: Every day | ORAL | Status: DC
  Administered 2018-12-17: 40 mg via ORAL
  Filled 2018-12-17: qty 1

## 2018-12-17 NOTE — Discharge Instructions (Signed)
Preventing Heart Failure °Heart failure is a condition in which the heart has trouble pumping blood. This may mean that the heart cannot pump enough blood out to the body, or that the heart does not fill up with enough blood. Either of those problems can lead to symptoms such as fatigue, trouble breathing, and swelling throughout the body. °This is a common medical condition that affects not only the heart, but the entire body. Making certain nutrition and lifestyle changes can help you prevent heart failure and avoid serious health problems. °What nutrition changes can be made? ° °· If you are overweight or obese, reduce how many calories you eat each day so that you lose weight. Work with your health care provider or a diet and nutrition specialist (dietitian) to determine how many calories you need each day. °· Eat foods that are low in salt (sodium). Avoid adding extra salt to foods. °· Eat a well-balanced diet that includes a lot of: °? Fresh fruits and vegetables. °? Whole grains. °? Lean meats. °? Beans. °? Fat-free or low-fat dairy products. °· Avoid foods that contain a lot of: °? Trans fats. °? Saturated fats. °? Sugar. °? Cholesterol. °What lifestyle changes can be made? ° °· Do not use any products that contain nicotine or tobacco, such as cigarettes and e-cigarettes. If you need help quitting or reducing how much you smoke, ask your health care provider. °· Stop using alcohol, or limit alcohol intake to no more than 1 drink a day for nonpregnant women and 2 drinks a day for men. One drink equals 12 oz of beer, 5 oz of wine, or 1½ oz of hard liquor. °· Exercise for at least 150 minutes each week, or as much as told by your health care provider. °? Do moderate-intensity exercise, such as brisk walking, bicycling, or water aerobics. °? Ask your health care provider which activities are safe for you. °· See a health care provider regularly for screening and wellness checks. Know your heart health  indicators, such as: °? Blood pressure. °? Cholesterol levels. °? Blood sugar (glucose) levels. °? Weight and BMI. °· If you have diabetes, manage your condition and follow your treatment plan as instructed. °· Try to get 7-9 hours of sleep each night. To help with sleep: °? Keep your bedroom cool and dark. °? Do not eat a heavy meal during the hour before you go to bed. °? Do not drink alcohol or caffeinated drinks before bed. °? Avoid screen time before bedtime. This means avoiding television, computers, tablets, and cell phones. °· Find ways to relax and manage stress. These may include: °? Breathing exercises. °? Meditation. °? Yoga. °? Listening to music. °Why are these changes important? °· A well-balanced diet with the appropriate amount of calories can keep your body weight at a healthy level, which reduces strain on your heart. °· A low-sodium diet can help keep your blood pressure in a normal range and keep your blood vessels working properly. °· Quitting smoking and limiting alcohol intake can reduce harmful effects that these substances have on your heart and blood vessels. °· Regular exercise can keep your heart strong so it can pump blood normally. °· Managing diabetes helps your blood circulate and can help you maintain a healthy weight. °· Managing stress helps to reduce the risk of high blood pressure and heart problems. °What can happen if changes are not made? °Heart failure can cause very serious problems that may get worse over time, such as: °·   Extreme fatigue during normal physical activities. °· Shortness of breath or trouble breathing. °· Swelling in your abdomen, legs, ankles, feet, or neck. °· Fluid buildup throughout the body. °· Weight gain. °· Cough. °· Frequent urination. °What can I do to lower my risk? °You may be able to lower your risk of heart failure by: °· Losing weight or keeping your weight under control. °· Working with your health care provider to manage  your: °? Cholesterol. °? Blood pressure. °? Diabetes, if this applies. °· Eating a healthy diet. °· Exercising regularly. °· Avoiding unhealthy habits, such as smoking, drinking, or using drugs. °· Getting plenty of sleep. °· Managing your stress. °How is this treated? °Heart failure cannot be cured except by heart transplant, but treatment can help to improve your quality of life. Treatment may include: °· Medicines to help: °? Lower blood pressure. °? Remove excess sodium from your body. °? Relax blood vessels. °? Improve heart function. °? Control other symptoms of heart failure. °· Surgery to open blocked coronary arteries or repair damaged heart valves. °· Implantation of a biventricular pacemaker to improve heart muscle function (cardiac resynchronization therapy). This device paces both the right ventricle and left ventricle. °· Implantation of a device to treat serious abnormal heart rhythms (implantable cardioverter defibrillator, ICD). °· Implantation of a mechanical heart pump to improve the pumping ability of your heart (left ventricular assist device, LVAD). °· Heart transplant. This treatment is considered for certain people who do not improve with other treatments. °Where to find more information °· [employer] [facility]: www.nhlbi.nih.gov/health/health-topics/topics/hf °· Centers for Disease Control and Prevention: www.cdc.gov/dhdsp/data_statistics/fact_sheets/fs_heart_failure.htm °· [facility]: nihseniorhealth.gov/heartfailure/heartfailuredefined/01.html °· American [employer] Association: www.heart.org/HEARTORG/Conditions/HeartFailure/Heart-Failure_UCM_002019_SubHomePage.jsp °Contact a health care provider if: °· You have rapid weight gain. °· You have increasing shortness of breath that is unusual for you. °· You tire easily, or you are unable to participate in your usual activities. °· You cough more than normal, especially with physical activity. °· You have any swelling or  more swelling in areas such as your hands, feet, ankles, or abdomen. °Summary °· Heart failure can be prevented by making changes to your diet and your lifestyle. °· It is important to eat a healthy diet, manage your weight, exercise regularly, manage stress, avoid drugs and alcohol, and keep your cholesterol and blood pressure under control. °· Heart failure can cause very serious problems over time. °This information is not intended to replace advice given to you by your health care provider. Make sure you discuss any questions you have with your health care provider. °Document Released: 11/18/2015 Document Revised: 07/21/2018 Document Reviewed: 11/18/2015 °[employer] Patient Education © 2020 [employer] °Heart Failure, Diagnosis ° °Heart failure means that your heart is not able to pump blood in the right way. This makes it hard for your body to work well. Heart failure is usually a long-term (chronic) condition. You must take good care of yourself and follow your treatment plan from your doctor. °What are the causes? °This condition may be caused by: °· High blood pressure. °· Build up of cholesterol and fat in the arteries. °· Heart attack. This injures the heart muscle. °· Heart valves that do not open and close properly. °· Damage of the heart muscle. This is also called cardiomyopathy. °· Lung disease. °· Abnormal heart rhythms. °What increases the risk? °The risk of heart failure goes up as a person ages. This condition is also more likely to develop in people who: °· Are overweight. °·   Are male. °· Smoke or chew tobacco. °· Abuse alcohol or illegal drugs. °· Have taken medicines that can damage the heart. °· Have diabetes. °· Have abnormal heart rhythms. °· Have thyroid problems. °· Have low blood counts (anemia). °What are the signs or symptoms? °Symptoms of this condition include: °· Shortness of breath. °· Coughing. °· Swelling of the feet, ankles, legs, or belly. °· Losing weight for no  reason. °· Trouble breathing. °· Waking from sleep because of the need to sit up and get more air. °· Rapid heartbeat. °· Being very tired. °· Feeling dizzy, or feeling like you may pass out (faint). °· Having no desire to eat. °· Feeling like you may vomit (nauseous). °· Peeing (urinating) more at night. °· Feeling confused. °How is this treated? ° °  ° °This condition may be treated with: °· Medicines. These can be given to treat blood pressure and to make the heart muscles stronger. °· Changes in your daily life. These may include eating a healthy diet, staying at a healthy body weight, quitting tobacco and illegal drug use, or doing exercises. °· Surgery. Surgery can be done to open blocked valves, or to put devices in the heart, such as pacemakers. °· A donor heart (heart transplant). You will receive a healthy heart from a donor. °Follow these instructions at home: °· Treat other conditions as told by your doctor. These may include high blood pressure, diabetes, thyroid disease, or abnormal heart rhythms. °· Learn as much as you can about heart failure. °· Get support as you need it. °· Keep all follow-up visits as told by your doctor. This is important. °Summary °· Heart failure means that your heart is not able to pump blood in the right way. °· This condition is caused by high blood pressure, heart attack, or damage of the heart muscle. °· Symptoms of this condition include shortness of breath and swelling of the feet, ankles, legs, or belly. You may also feel very tired or feel like you may vomit. °· You may be treated with medicines, surgery, or changes in your daily life. °· Treat other health conditions as told by your doctor. °This information is not intended to replace advice given to you by your health care provider. Make sure you discuss any questions you have with your health care provider. °Document Released: 01/06/2008 Document Revised: 06/16/2018 Document Reviewed: 06/16/2018 °[employer] Patient  Education © 2020 [employer] ° °

## 2018-12-17 NOTE — Discharge Summary (Signed)
Physician Discharge Summary  MARCOANTONIO LEGAULT MHD:[id number] DOB: 08/05/1945 DOA: 12/15/2018  PCP: Debbrah Alar, NP  Admit date: 12/15/2018 Discharge date: 12/17/2018  Admitted From: Home Disposition: Home  Recommendations for Outpatient Follow-up:  1. Follow up with PCP in 1-2 weeks 2. Follow-up with cardiology, Dr. Harrington Challenger in 1 to 2 weeks 3. Please obtain BMP at next PCP or cardiology visit 4. Patient's furosemide increased to 40 mg p.o. daily 5. Discontinued hydralazine and Imdur due to borderline hypotension 6. Review weight log at next PCP/cardiology visit; instructed patient to maintain daily  Home Health: No Equipment/Devices: None  Discharge Condition: Stable CODE STATUS: Full code  Diet recommendation: Heart Healthy   History of present illness:  [name] a 73 y.o.malewith medical history significant ofCHF last EF 30-35%, HTN, HLD, CAD, and OSA on CPAP; who presents with acute onset of shortness of breath around2-3AM while he was at work. History is somewhat limited as patient currently on BiPAP. He reports taking all of his medications as prescribed. Unable to try anything to relieve symptoms as they progressed quickly. Denies significant change in weight, leg swelling, calf pain, chest pain, cough, fever, chills, nausea, vomiting, or diarrhea. En route with EMShe was given epinephrine and Solu-Medrol.  ED Course:Upon admission to the emergency department patient was noted to have a temperature of 100 F, pulse up to 115, respirations 23-37, blood pressures initially noted to be stable, and O2 saturations maintained on BiPAP. Labs significant for creatinine 1.49, BNP 453.3, and troponin 35. Consistent with mild CHF. Patient was given 40 mg of Lasix and started on a nitroglycerin drip, but was discontinued after patient developed hypotension with blood pressure as low as 87/70. [facility] called to admit.  Hospital course:  Acute respiratory failure with hypoxia   Acute on chronic systolic congestive heart failure Elevated troponin likely secondary to type II demand ischemia Presented to the ED with significant respiratory distress for which he was placed on BiPAP. On physical exam patient noted to have crackles in both lung fields. Chest x-ray revealed mild CHF and BNP elevated at 453.3.High-sensitivity troponins were elevated up to 52, but suspect secondary to demand as patient denies any chest pain complaints. Last EF noted to be around 30 to 35% in 2018. Repeat TTE 9/4 with improved EF 35-40% from previous 30-35%.  Patient was started on furosemide 40 mg IV twice daily with good diuresis and net  Negative 5.3 L during the hospitalization.  Weight at time of discharge 93.2 kg.  Patient will increase his home furosemide from 20 mg to 40 mg p.o. daily.  Continue Coreg, Spironolactone, statin.  Not on ACE inhibitor secondary to history of angioedema.  Patient to follow-up with his primary cardiologist, Dr. Harrington Challenger in 1-2 weeks.  Patient instructed to maintain daily weight log to bring to next PCP and cardiology visit.  Transient hypotension: Resolved Patient was noted to have systolic blood pressures in the 80s following initiation of nitroglycerin drip by EDP.  Nitroglycerin drip was discontinued with resolution of his hypotension.  His home antihypertensives were initially held.  His Coreg and Spironolactone were restarted with improvement of his blood pressures.  Patient will discontinue Imdur and hydralazine following discharge.  Continue Coreg, Spironolactone, and increased dose of furosemide.  Patient instructed to monitor his blood pressures outpatient, may go to local pharmacy for assistance; but recommended obtaining his own personal BP monitor.  Follow-up with PCP and cardiology.  Hyperlipidemia: Continue home atorvastatin 10 mg p.o. daily  OSA: Continue  CPAP at night  Discharge Diagnoses:  Principal Problem:   Systolic congestive heart failure  (HCC) Active Problems:   Hyperlipidemia   OSA (obstructive sleep apnea)   Hyperglycemia   Acute kidney injury superimposed on CKD (HCC)   Transient hypotension   Acute respiratory distress    Discharge Instructions  Discharge Instructions    (HEART FAILURE PATIENTS) Call MD:  Anytime you have any of the following symptoms: 1) 3 pound weight gain in 24 hours or 5 pounds in 1 week 2) shortness of breath, with or without a dry hacking cough 3) swelling in the hands, feet or stomach 4) if you have to sleep on extra pillows at night in order to breathe.   Complete by: As directed    Call MD for:  difficulty breathing, headache or visual disturbances   Complete by: As directed    Call MD for:  extreme fatigue   Complete by: As directed    Call MD for:  persistant dizziness or light-headedness   Complete by: As directed    Call MD for:  persistant nausea and vomiting   Complete by: As directed    Call MD for:  severe uncontrolled pain   Complete by: As directed    Diet - low sodium heart healthy   Complete by: As directed    Increase activity slowly   Complete by: As directed      Allergies as of 12/17/2018      Reactions   Lisinopril Swelling   ANGIOEDEMA      Medication List    STOP taking these medications   hydrALAZINE 10 MG tablet Commonly known as: APRESOLINE   isosorbide mononitrate 30 MG 24 hr tablet Commonly known as: IMDUR     TAKE these medications   aspirin EC 81 MG tablet Take 1 tablet (81 mg total) by mouth daily.   atorvastatin 10 MG tablet Commonly known as: LIPITOR TAKE 1 TABLET BY MOUTH ONCE DAILY AT  6  00  PM What changed: See the new instructions.   carvedilol 25 MG tablet Commonly known as: COREG Take 1 tablet by mouth twice daily What changed: when to take this   diphenhydrAMINE 25 MG tablet Commonly known as: BENADRYL Take 50 mg by mouth every 6 (six) hours as needed.   furosemide 40 MG tablet Commonly known as: LASIX Take 1 tablet  (40 mg total) by mouth daily. What changed:   medication strength  how much to take   potassium chloride 10 MEQ tablet Commonly known as: K-DUR Take 1 tablet by mouth once daily   PRESERVISION AREDS 2+MULTI VIT PO Take 1 tablet by mouth daily.   spironolactone 25 MG tablet Commonly known as: ALDACTONE Take 1/2 (one-half) tablet by mouth once daily What changed: See the new instructions.      Follow-up Information    Sandford Craze'Sullivan, Melissa, NP. Schedule an appointment as soon as possible for a visit in 1 week(s).   Specialty: Internal Medicine Contact information: [address] [place] [address] [phone]        Pricilla Riffleoss, Paula V, MD. Schedule an appointment as soon as possible for a visit in 1 week(s).   Specialty: Cardiology Contact information: [address] [place] [address] [phone]          Allergies  Allergen Reactions  . Lisinopril Swelling    ANGIOEDEMA    Consultations:  None   Procedures/Studies: Dg Chest Port 1 [address]View  Result Date: 12/15/2018 CLINICAL DATA:  Sudden onset of shortness of breath EXAM: PORTABLE CHEST 1 VIEW COMPARISON:  04/26/2006 FINDINGS: Cardiac shadow is enlarged but stable. Aortic calcifications are noted. Vascular congestion with mild interstitial edema is seen. No sizable effusion is noted. No bony abnormality is seen. IMPRESSION: Changes consistent with mild CHF. Electronically Signed   By: Alcide Clever M.D.   On: 12/15/2018 06:38     TTE 12/15/2018: IMPRESSIONS 1. The left ventricle has moderately reduced systolic function, with an ejection fraction of 35-40%. The cavity size was normal. There is mildly increased left ventricular wall thickness. Left ventricular diastolic Doppler parameters are consistent with pseudonormalization. Elevated left atrial and left ventricular end-diastolic pressures The E/e' is >15. Left ventricular diffuse hypokinesis. 2. The mitral valve is grossly  normal. 3. The tricuspid valve is grossly normal. 4. The aortic valve is grossly normal. 5. The aorta is normal unless otherwise noted. 6. The inferior vena cava was dilated in size with >50% respiratory variability. 7. The interatrial septum was not well visualized. 8. When compared to the prior study: 11/11/2016: LVEF 30-35%   Subjective:   Discharge Exam: Vitals:   12/16/18 2029 12/17/18 0605  BP: (!) 144/79 (!) 108/91  Pulse: 76 74  Resp: 18 18  Temp: 97.7 F (36.5 C) 98.1 F (36.7 C)  SpO2: 96% 96%   Vitals:   12/16/18 0500 12/16/18 1450 12/16/18 2029 12/17/18 0605  BP: 125/79 122/70 (!) 144/79 (!) 108/91  Pulse: 77 82 76 74  Resp: 18 17 18 18   Temp: 98.4 F (36.9 C) 98.1 F (36.7 C) 97.7 F (36.5 C) 98.1 F (36.7 C)  TempSrc: Oral Oral Oral Oral  SpO2: 96% 98% 96% 96%  Weight: 97.4 kg   93.2 kg  Height:        General: Pt is alert, awake, not in acute distress Cardiovascular: RRR, S1/S2 +, no rubs, no gallops Respiratory: CTA bilaterally, no wheezing, no rhonchi Abdominal: Soft, NT, ND, bowel sounds + Extremities: no edema, no cyanosis    The results of significant diagnostics from this hospitalization (including imaging, microbiology, ancillary and laboratory) are listed below for reference.     Microbiology: Recent Results (from the past 240 hour(s))  SARS Coronavirus 2 [facility] order, Performed in [facility] hospital lab) Nasopharyngeal Nasopharyngeal Swab     Status: None   Collection Time: 12/15/18  7:36 AM   Specimen: Nasopharyngeal Swab  Result Value Ref Range Status   SARS Coronavirus 2 NEGATIVE NEGATIVE Final    Comment: (NOTE) If result is NEGATIVE SARS-CoV-2 target nucleic acids are NOT DETECTED. The SARS-CoV-2 RNA is generally detectable in upper and lower  respiratory specimens during the acute phase of infection. The lowest  concentration of SARS-CoV-2 viral copies this assay can detect is 250  copies / mL. A negative result  does not preclude SARS-CoV-2 infection  and should not be used as the sole basis for treatment or other  patient management decisions.  A negative result may occur with  improper specimen collection / handling, submission of specimen other  than nasopharyngeal swab, presence of viral mutation(s) within the  areas targeted by this assay, and inadequate number of viral copies  (<250 copies / mL). A negative result must be combined with clinical  observations, patient history, and epidemiological information. If result is POSITIVE SARS-CoV-2 target nucleic acids are DETECTED. The SARS-CoV-2 RNA is generally detectable in upper and lower  respiratory specimens dur ing the acute phase of infection.  Positive  results are indicative of active infection with SARS-CoV-2.  Clinical  correlation with patient history and other diagnostic information is  necessary to determine patient infection status.  Positive results do  not rule out bacterial infection or co-infection with other viruses. If result is PRESUMPTIVE POSTIVE SARS-CoV-2 nucleic acids MAY BE PRESENT.   A presumptive positive result was obtained on the submitted specimen  and confirmed on repeat testing.  While 2019 novel coronavirus  (SARS-CoV-2) nucleic acids may be present in the submitted sample  additional confirmatory testing may be necessary for epidemiological  and / or clinical management purposes  to differentiate between  SARS-CoV-2 and other Sarbecovirus currently known to infect humans.  If clinically indicated additional testing with an alternate test  methodology [phone]([id number]) is advised. The SARS-CoV-2 RNA is generally  detectable in upper and lower respiratory sp ecimens during the acute  phase of infection. The expected result is Negative. Fact Sheet for Patients:  BoilerBrush.com.cyhttps://www.fda.gov/media/136312/download Fact Sheet for Healthcare Providers: https://pope.com/https://www.fda.gov/media/136313/download This test is not yet approved or  cleared by the [place] FDA and has been authorized for detection and/or diagnosis of SARS-CoV-2 by FDA under an Emergency Use Authorization (EUA).  This EUA will remain in effect (meaning this test can be used) for the duration of the COVID-19 declaration under Section 564(b)(1) of the Act, 21 U.S.C. section 360bbb-3(b)(1), unless the authorization is terminated or revoked sooner. Performed at [facility], [address] [place], [place] [address]      Labs: BNP (last 3 results) Recent Labs    12/15/18 0613  BNP 453.3*   Basic Metabolic Panel: Recent Labs  Lab 12/15/18 0613 12/16/18 0515 12/17/18 0631  NA 139 141 143  K 4.3 4.0 3.4*  CL 105 105 102  CO2 26 25 29   GLUCOSE 209* 135* 106*  BUN 19 19 24*  CREATININE 1.49* 1.21 1.32*  CALCIUM 9.2 9.5 9.4  MG  --   --  2.3   Liver Function Tests: No results for input(s): AST, ALT, ALKPHOS, BILITOT, PROT, ALBUMIN in the last 168 hours. No results for input(s): LIPASE, AMYLASE in the last 168 hours. No results for input(s): AMMONIA in the last 168 hours. CBC: Recent Labs  Lab 12/15/18 0613 12/16/18 0515  WBC 6.5 12.8*  NEUTROABS 2.6 10.6*  HGB 16.5 16.5  HCT 51.0 48.7  MCV 88.9 84.7  PLT 165 172   Cardiac Enzymes: No results for input(s): CKTOTAL, CKMB, CKMBINDEX, TROPONINI in the last 168 hours. BNP: Invalid input(s): POCBNP CBG: No results for input(s): GLUCAP in the last 168 hours. D-Dimer No results for input(s): DDIMER in the last 72 hours. Hgb A1c No results for input(s): HGBA1C in the last 72 hours. Lipid Profile No results for input(s): CHOL, HDL, LDLCALC, TRIG, CHOLHDL, LDLDIRECT in the last 72 hours. Thyroid function studies No results for input(s): TSH, T4TOTAL, T3FREE, THYROIDAB in the last 72 hours.  Invalid input(s): FREET3 Anemia work up No results for input(s): VITAMINB12, FOLATE, FERRITIN, TIBC, IRON, RETICCTPCT in the last 72 hours. Urinalysis No results found for:  COLORURINE, APPEARANCEUR, LABSPEC, PHURINE, GLUCOSEU, HGBUR, BILIRUBINUR, KETONESUR, PROTEINUR, UROBILINOGEN, NITRITE, LEUKOCYTESUR Sepsis Labs Invalid input(s): PROCALCITONIN,  WBC,  LACTICIDVEN Microbiology Recent Results (from the past 240 hour(s))  SARS Coronavirus 2 [facility](Hospital order, Performed in [facility] hospital lab) Nasopharyngeal Nasopharyngeal Swab     Status: None   Collection Time: 12/15/18  7:36 AM   Specimen: Nasopharyngeal Swab  Result Value Ref Range Status  SARS Coronavirus 2 NEGATIVE NEGATIVE Final    Comment: (NOTE) If result is NEGATIVE SARS-CoV-2 target nucleic acids are NOT DETECTED. The SARS-CoV-2 RNA is generally detectable in upper and lower  respiratory specimens during the acute phase of infection. The lowest  concentration of SARS-CoV-2 viral copies this assay can detect is 250  copies / mL. A negative result does not preclude SARS-CoV-2 infection  and should not be used as the sole basis for treatment or other  patient management decisions.  A negative result may occur with  improper specimen collection / handling, submission of specimen other  than nasopharyngeal swab, presence of viral mutation(s) within the  areas targeted by this assay, and inadequate number of viral copies  (<250 copies / mL). A negative result must be combined with clinical  observations, patient history, and epidemiological information. If result is POSITIVE SARS-CoV-2 target nucleic acids are DETECTED. The SARS-CoV-2 RNA is generally detectable in upper and lower  respiratory specimens dur ing the acute phase of infection.  Positive  results are indicative of active infection with SARS-CoV-2.  Clinical  correlation with patient history and other diagnostic information is  necessary to determine patient infection status.  Positive results do  not rule out bacterial infection or co-infection with other viruses. If result is PRESUMPTIVE POSTIVE SARS-CoV-2 nucleic acids MAY BE  PRESENT.   A presumptive positive result was obtained on the submitted specimen  and confirmed on repeat testing.  While 2019 novel coronavirus  (SARS-CoV-2) nucleic acids may be present in the submitted sample  additional confirmatory testing may be necessary for epidemiological  and / or clinical management purposes  to differentiate between  SARS-CoV-2 and other Sarbecovirus currently known to infect humans.  If clinically indicated additional testing with an alternate test  methodology [phone]([id number]) is advised. The SARS-CoV-2 RNA is generally  detectable in upper and lower respiratory sp ecimens during the acute  phase of infection. The expected result is Negative. Fact Sheet for Patients:  BoilerBrush.com.cyhttps://www.fda.gov/media/136312/download Fact Sheet for Healthcare Providers: https://pope.com/https://www.fda.gov/media/136313/download This test is not yet approved or cleared by the [place] FDA and has been authorized for detection and/or diagnosis of SARS-CoV-2 by FDA under an Emergency Use Authorization (EUA).  This EUA will remain in effect (meaning this test can be used) for the duration of the COVID-19 declaration under Section 564(b)(1) of the Act, 21 U.S.C. section 360bbb-3(b)(1), unless the authorization is terminated or revoked sooner. Performed at [facility], [address] [place], [place] [address]      Time coordinating discharge: Over 30 minutes  SIGNED:   Alvira PhilipsEric J [place], DO  [facility] 12/17/2018, 8:40 AM

## 2018-12-17 NOTE — Progress Notes (Signed)
Patient refused CPAP for tonight 

## 2018-12-20 ENCOUNTER — Ambulatory Visit (INDEPENDENT_AMBULATORY_CARE_PROVIDER_SITE_OTHER): Payer: Medicare Other

## 2018-12-20 ENCOUNTER — Other Ambulatory Visit: Payer: Self-pay

## 2018-12-20 DIAGNOSIS — Z23 Encounter for immunization: Secondary | ICD-10-CM

## 2018-12-21 ENCOUNTER — Other Ambulatory Visit: Payer: Self-pay

## 2018-12-22 ENCOUNTER — Encounter: Payer: Self-pay | Admitting: Family

## 2018-12-22 ENCOUNTER — Ambulatory Visit (INDEPENDENT_AMBULATORY_CARE_PROVIDER_SITE_OTHER): Payer: Medicare Other | Admitting: Family

## 2018-12-22 ENCOUNTER — Other Ambulatory Visit: Payer: Self-pay

## 2018-12-22 VITALS — BP 112/70 | HR 69 | Temp 97.0°F | Resp 16 | Ht 69.0 in | Wt 208.0 lb

## 2018-12-22 DIAGNOSIS — I2584 Coronary atherosclerosis due to calcified coronary lesion: Secondary | ICD-10-CM

## 2018-12-22 DIAGNOSIS — I2510 Atherosclerotic heart disease of native coronary artery without angina pectoris: Secondary | ICD-10-CM | POA: Diagnosis not present

## 2018-12-22 DIAGNOSIS — I5022 Chronic systolic (congestive) heart failure: Secondary | ICD-10-CM

## 2018-12-22 NOTE — Progress Notes (Signed)
Subjective:    Patient ID: [name], male    DOB: Nov 15, 1945, 73 y.o.   MRN: [id number]  HPI  Patient is a 73 yr old male who presents today for hospital follow up. He was admitted 9/4-9/6 with acute SOB and found to be in CHF exacerbation. He required bipap early in his hospitalization and was diuresed.  He was noted to have an elevated troponin which was felt to be due to demand ischemia.    Furosemide was increased from 20mg  to 40mg  once daily at time of discharge.   Today he reports feeling well. He denies SOB/CP or swelling since he returned home. Reports good compliance with medications.    Wt Readings from Last 3 Encounters:  12/22/18 208 lb (94.3 kg)  12/17/18 205 lb 6.4 oz (93.2 kg)  11/28/18 223 lb (101.2 kg)   Would like to remain out of work through 01/15/19.   Review of Systems Past Medical History:  Diagnosis Date  . Anomalous right coronary artery   . Chronic systolic CHF (congestive heart failure) (HCC) 08/18/2016   Echo 1/17:  Diff HK especially in inf wall, mild LVH, EF 30-35, mild AS (mean 10, peak 18) // Echo 11/15: Mild LVH, EF 30-35, moderate HK, Gr 1 DD, mild AI, mild RAE // Echo 8/18: Mild LVH, EF 30-35, diffuse HK, mild aortic stenosis (mean 7), mild AI, mild BAE  . Coronary artery calcification 08/18/2016   Cor CTA 1/08: Ca score 248 // LHC 1/08: oLAD 25%  . History of ETOH abuse   . Hyperlipidemia 08/27/2013  . Hypertension   . Non-ischemic cardiomyopathy (HCC)    Presumed secondary to Etoh and HTN  (Prev EF 20-25% -- improved to 50%) // LHC 1/08: oLAD 25, EF 25%   . OSA (obstructive sleep apnea) 10/17/2013   Severe per home sleep study performed on 09/26/13.      Social History   Socioeconomic History  . Marital status: Married    Spouse name: [name]  . Number of children: 3  . Years of education: Not on file  . Highest education level: Not on file  Occupational History  . Occupation: Retired Set designer  . Financial resource strain:  Not on file  . Food insecurity    Worry: Not on file    Inability: Not on file  . Transportation needs    Medical: Not on file    Non-medical: Not on file  Tobacco Use  . Smoking status: Former Smoker    Years: 20.00    Quit date: 04/12/2002    Years since quitting: 16.7  . Smokeless tobacco: Never Used  Substance and Sexual Activity  . Alcohol use: Yes    Alcohol/week: 7.0 standard drinks    Types: 7 Glasses of wine per week    Comment: 1 glass of wine each night.    . Drug use: No    Comment: history of marijuana use  . Sexual activity: Yes  Lifestyle  . Physical activity    Days per week: Not on file    Minutes per session: Not on file  . Stress: Not on file  Relationships  . Social Musician on phone: Not on file    Gets together: Not on file    Attends religious service: Not on file    Active member of club or organization: Not on file    Attends meetings of clubs or organizations: Not on file  Relationship status: Not on file  . Intimate partner violence    Fear of current or ex partner: Not on file    Emotionally abused: Not on file    Physically abused: Not on file    Forced sexual activity: Not on file  Other Topics Concern  . Not on file  Social History Narrative   2 sons and daughter   1 son in [name]. Living with wife 831yrs [name]([place]).  Use to live near [place].    No past surgical history on file.  Family History  Problem Relation Age of Onset  . Alcohol abuse Mother   . Heart disease Mother   . Alcohol abuse Father   . Heart failure Father   . Heart disease Maternal Grandmother   . Stroke Maternal Grandmother   . Diabetes Neg Hx     Allergies  Allergen Reactions  . Lisinopril Swelling    ANGIOEDEMA    Current Outpatient Medications on File Prior to Visit  Medication Sig Dispense Refill  . aspirin EC 81 MG tablet Take 1 tablet (81 mg total) by mouth daily. 90 tablet 3  . atorvastatin (LIPITOR) 10 MG tablet TAKE 1 TABLET BY  MOUTH ONCE DAILY AT  6  00  PM (Patient taking differently: Take 10 mg by mouth daily at 6 PM. ) 90 tablet 1  . carvedilol (COREG) 25 MG tablet Take 1 tablet by mouth twice daily (Patient taking differently: Take 25 mg by mouth 2 (two) times daily with a meal. ) 180 tablet 0  . diphenhydrAMINE (BENADRYL) 25 MG tablet Take 50 mg by mouth every 6 (six) hours as needed.    . furosemide (LASIX) 40 MG tablet Take 1 tablet (40 mg total) by mouth daily. 90 tablet 0  . Multiple Vitamins-Minerals (PRESERVISION AREDS 2+MULTI VIT PO) Take 1 tablet by mouth daily.    . potassium chloride (K-DUR) 10 MEQ tablet Take 1 tablet by mouth once daily (Patient taking differently: Take 10 mEq by mouth daily. ) 90 tablet 0  . spironolactone (ALDACTONE) 25 MG tablet Take 1/2 (one-half) tablet by mouth once daily (Patient taking differently: Take 12.5 mg by mouth daily. ) 45 tablet 1   No current facility-administered medications on file prior to visit.     BP 112/70 (BP Location: Right Arm, Patient Position: Sitting, Cuff Size: Normal)   Pulse 69   Temp (!) 97 F (36.1 C) (Temporal)   Resp 16   Ht 5\' 9"  (1.753 m)   Wt 208 lb (94.3 kg)   SpO2 98%   BMI 30.72 kg/m       Objective:   Physical Exam Constitutional:      General: He is not in acute distress.    Appearance: He is well-developed.  HENT:     Head: Normocephalic and atraumatic.  Cardiovascular:     Rate and Rhythm: Normal rate and regular rhythm.     Heart sounds: No murmur.  Pulmonary:     Effort: Pulmonary effort is normal. No respiratory distress.     Breath sounds: Normal breath sounds. No wheezing or rales.  Musculoskeletal:        General: No swelling.  Skin:    General: Skin is warm and dry.  Neurological:     Mental Status: He is alert and oriented to person, place, and time.  Psychiatric:        Behavior: Behavior normal.        Thought Content: Thought content normal.  Assessment & Plan:  CHF- clinically  euvolemic on exam today. Continue current dose of lasix.  He is advised to keep his upcoming follow up appointment with cardiology on 01/02/19.  A follow up bmet is obtaind and electrolytes/renal function are stable.

## 2018-12-22 NOTE — Patient Instructions (Signed)

## 2018-12-23 LAB — BASIC METABOLIC PANEL
BUN/Creatinine Ratio: 15 (calc) (ref 6–22)
BUN: 18 mg/dL (ref 7–25)
CO2: 26 mmol/L (ref 20–32)
Calcium: 9.2 mg/dL (ref 8.6–10.3)
Chloride: 105 mmol/L (ref 98–110)
Creat: 1.21 mg/dL — ABNORMAL HIGH (ref 0.70–1.18)
Glucose, Bld: 88 mg/dL (ref 65–99)
Potassium: 4.3 mmol/L (ref 3.5–5.3)
Sodium: 141 mmol/L (ref 135–146)

## 2018-12-28 DIAGNOSIS — Z0279 Encounter for issue of other medical certificate: Secondary | ICD-10-CM

## 2019-01-01 NOTE — Progress Notes (Signed)
Cardiology Office Note    Date:  01/02/2019   ID:  [name], DOB 01/08/1946, MRN [id number]  PCP:  Sandford Craze'Sullivan, Melissa, NP  Cardiologist:  Dr. Tenny Crawoss  Chief Complaint: Hospital follow up  History of Present Illness:   [name] is a 73 y.o. male chronic systolic CHF 2nd to NICM, HTN, OSA on CPAP, mild aortic stenosis and mild coronary plaque by cath in 2008 seen for hospital follow up.   Calcium score by coronary CTA in 04/2006 was 248. However, cardiac catheterization demonstrated minimal coronary plaque.  He is not on ACE inhibitor/ARB or Entresto secondary to ACE inhibitor allergy. Last seen by Tereso NewcomerScott Weaver 01/2018.   She was admitted 12/2018 with acute hypoxic respiratory failure with acute CHF. Elevated troponin felt demand. Chest x-ray revealed mild CHF and BNPelevated at453. Echo showed improved EF 35-40% from previous 30-35%. Diuresed 5L. Discharge weight was 93.2Kg. advised to follow up with cardiologist.   Here today for follow up.  Patient denies recurrent shortness of breath.  Weight has been stable.  Uses low-sodium diet.  Compliant with medication.  No chest pain, orthopnea, PND, syncope, lower extremity edema or melena.  Past Medical History:  Diagnosis Date  . Anomalous right coronary artery   . Chronic systolic CHF (congestive heart failure) (HCC) 08/18/2016   Echo 1/17:  Diff HK especially in inf wall, mild LVH, EF 30-35, mild AS (mean 10, peak 18) // Echo 11/15: Mild LVH, EF 30-35, moderate HK, Gr 1 DD, mild AI, mild RAE // Echo 8/18: Mild LVH, EF 30-35, diffuse HK, mild aortic stenosis (mean 7), mild AI, mild BAE  . Coronary artery calcification 08/18/2016   Cor CTA 1/08: Ca score 248 // LHC 1/08: oLAD 25%  . History of ETOH abuse   . Hyperlipidemia 08/27/2013  . Hypertension   . Non-ischemic cardiomyopathy (HCC)    Presumed secondary to Etoh and HTN  (Prev EF 20-25% -- improved to 50%) // LHC 1/08: oLAD 25, EF 25%   . OSA (obstructive sleep apnea) 10/17/2013    Severe per home sleep study performed on 09/26/13.     No past surgical history on file.  Current Medications: Prior to Admission medications   Medication Sig Start Date End Date Taking? Authorizing Provider  aspirin EC 81 MG tablet Take 1 tablet (81 mg total) by mouth daily. 08/18/16   Tereso NewcomerWeaver, Scott T, PA-C  atorvastatin (LIPITOR) 10 MG tablet TAKE 1 TABLET BY MOUTH ONCE DAILY AT  6  00  PM Patient taking differently: Take 10 mg by mouth daily at 6 PM.  07/25/18   Pricilla Riffleoss, Paula V, MD  carvedilol (COREG) 25 MG tablet Take 1 tablet by mouth twice daily Patient taking differently: Take 25 mg by mouth 2 (two) times daily with a meal.  11/08/18   Sandford Craze'Sullivan, Melissa, NP  diphenhydrAMINE (BENADRYL) 25 MG tablet Take 50 mg by mouth every 6 (six) hours as needed.    [provider]  furosemide (LASIX) 40 MG tablet Take 1 tablet (40 mg total) by mouth daily. 12/17/18 03/17/19  [place], Alvira PhilipsEric J, DO  Multiple Vitamins-Minerals (PRESERVISION AREDS 2+MULTI VIT PO) Take 1 tablet by mouth daily.    [provider]  potassium chloride (K-DUR) 10 MEQ tablet Take 1 tablet by mouth once daily Patient taking differently: Take 10 mEq by mouth daily.  11/08/18   Sandford Craze'Sullivan, Melissa, NP  spironolactone (ALDACTONE) 25 MG tablet Take 1/2 (one-half) tablet by mouth once daily Patient taking differently:  Take 12.5 mg by mouth daily.  07/25/18   Pricilla Riffle, MD    Allergies:   Lisinopril   Social History   Socioeconomic History  . Marital status: Married    Spouse name: [name]  . Number of children: 3  . Years of education: Not on file  . Highest education level: Not on file  Occupational History  . Occupation: Retired Set designer  . Financial resource strain: Not on file  . Food insecurity    Worry: Not on file    Inability: Not on file  . Transportation needs    Medical: Not on file    Non-medical: Not on file  Tobacco Use  . Smoking status: Former Smoker    Years: 20.00    Quit  date: 04/12/2002    Years since quitting: 16.7  . Smokeless tobacco: Never Used  Substance and Sexual Activity  . Alcohol use: Yes    Alcohol/week: 7.0 standard drinks    Types: 7 Glasses of wine per week    Comment: 1 glass of wine each night.    . Drug use: No    Comment: history of marijuana use  . Sexual activity: Yes  Lifestyle  . Physical activity    Days per week: Not on file    Minutes per session: Not on file  . Stress: Not on file  Relationships  . Social Musician on phone: Not on file    Gets together: Not on file    Attends religious service: Not on file    Active member of club or organization: Not on file    Attends meetings of clubs or organizations: Not on file    Relationship status: Not on file  Other Topics Concern  . Not on file  Social History Narrative   2 sons and daughter   1 son in [name]. Living with wife 33yrs [name]).  Use to live near [place].     Family History:  The patient's family history includes Alcohol abuse in his father and mother; Heart disease in his maternal grandmother and mother; Heart failure in his father; Stroke in his maternal grandmother.   ROS:   Please see the history of present illness.    ROS All other systems reviewed and are negative.   PHYSICAL EXAM:   VS:  BP 110/62   Pulse 68   Ht 5\' 9"  (1.753 m)   Wt 212 lb 1.9 oz (96.2 kg)   SpO2 96%   BMI 31.32 kg/m    GEN: Well nourished, well developed, in no acute distress  HEENT: normal  Neck: no JVD, carotid bruits, or masses Cardiac: RRR; no murmurs, rubs, or gallops,no edema  Respiratory:  clear to auscultation bilaterally, normal work of breathing GI: soft, nontender, nondistended, + BS MS: no deformity or atrophy  Skin: warm and dry, no rash Neuro:  Alert and Oriented x 3, Strength and sensation are intact Psych: euthymic mood, full affect  Wt Readings from Last 3 Encounters:  01/02/19 212 lb 1.9 oz (96.2 kg)  12/22/18 208 lb (94.3 kg)   12/17/18 205 lb 6.4 oz (93.2 kg)      Studies/Labs Reviewed:   EKG:  EKG is not ordered today.    Recent Labs: 11/28/2018: ALT 15 12/15/2018: B Natriuretic Peptide 453.3 12/16/2018: Hemoglobin 16.5; Platelets 172 12/17/2018: Magnesium 2.3 12/22/2018: BUN 18; Creat 1.21; Potassium 4.3; Sodium 141   Lipid Panel    Component Value Date/Time  CHOL 136 02/28/2018 0831   TRIG 80.0 02/28/2018 0831   HDL 61.50 02/28/2018 0831   CHOLHDL 2 02/28/2018 0831   VLDL 16.0 02/28/2018 0831   LDLCALC 58 02/28/2018 0831    Additional studies/ records that were reviewed today include:   Echocardiogram: 12/15/2018   1. The left ventricle has moderately reduced systolic function, with an ejection fraction of 35-40%. The cavity size was normal. There is mildly increased left ventricular wall thickness. Left ventricular diastolic Doppler parameters are consistent with  pseudonormalization. Elevated left atrial and left ventricular end-diastolic pressures The E/e' is >15. Left ventricular diffuse hypokinesis.  2. The mitral valve is grossly normal.  3. The tricuspid valve is grossly normal.  4. The aortic valve is grossly normal.  5. The aorta is normal unless otherwise noted.  6. The inferior vena cava was dilated in size with >50% respiratory variability.  7. The interatrial septum was not well visualized.  8. When compared to the prior study: 11/11/2016: LVEF 30-35%.  SUMMARY   LVEF 35-40%, moderate global hypokinesis, mild LVH, grade 2 DD, high LV filling pressure, mild RV systolic dysfunction, dilated IVC that collapses  ASSESSMENT & PLAN:    1. Chronic systolic CHF/ NICM - Recent echo showed LVEF of 35-40% (previously 30-35%) - He is not on ACE inhibitor/ARB or Entresto secondary to ACE inhibitor allergy. -Euvolemic. -Continue Coreg 25 mg twice daily.  Continue Lasix 40 mg daily.  Advised to take additional 20 mg for weight gain or dyspnea.  2. HTN -Blood pressure stable on current  medication.  3. OSA on CPAP    Medication Adjustments/Labs and Tests Ordered: Current medicines are reviewed at length with the patient today.  Concerns regarding medicines are outlined above.  Medication changes, Labs and Tests ordered today are listed in the Patient Instructions below. Patient Instructions  Medication Instructions:  Your physician recommends that you continue on your current medications as directed. Please refer to the Current Medication list given to you today.  If you need a refill on your cardiac medications before your next appointment, please call your pharmacy.   Lab work: NONE ORDERED  TODAY   If you have labs (blood work) drawn today and your tests are completely normal, you will receive your results only by: Marland Kitchen MyChart Message (if you have MyChart) OR . A paper copy in the mail If you have any lab test that is abnormal or we need to change your treatment, we will call you to review the results.  Testing/Procedures: NONE ORDERED  TODAY   Follow-Up: At [facility], you and your health needs are our priority.  As part of our continuing mission to provide you with exceptional heart care, we have created designated Provider Care Teams.  These Care Teams include your primary Cardiologist (physician) and Advanced Practice Providers (APPs -  Physician Assistants and Nurse Practitioners) who all work together to provide you with the care you need, when you need it. You will need a follow up appointment in:  3 -4  months.  Please call our office 2 months in advance to schedule this appointment.  You may see Dorris Carnes, MD or one of the following Advanced Practice Providers on your designated Care Team: Richardson Dopp, PA-C [place], [place] . Daune Perch, NP  Any Other Special Instructions Will Be Listed Below (If Applicable).       Jarrett Soho, [place]  01/02/2019 11:47 AM    [facility]  [address] [place], [place]   [address] Phone: [phone]; Fax: [phone]

## 2019-01-02 ENCOUNTER — Encounter: Payer: Self-pay | Admitting: Physician Assistant

## 2019-01-02 ENCOUNTER — Other Ambulatory Visit: Payer: Self-pay

## 2019-01-02 ENCOUNTER — Ambulatory Visit (INDEPENDENT_AMBULATORY_CARE_PROVIDER_SITE_OTHER): Payer: Medicare Other | Admitting: Physician Assistant

## 2019-01-02 VITALS — BP 110/62 | HR 68 | Ht 69.0 in | Wt 212.1 lb

## 2019-01-02 DIAGNOSIS — I5022 Chronic systolic (congestive) heart failure: Secondary | ICD-10-CM

## 2019-01-02 DIAGNOSIS — I2584 Coronary atherosclerosis due to calcified coronary lesion: Secondary | ICD-10-CM | POA: Diagnosis not present

## 2019-01-02 DIAGNOSIS — I2510 Atherosclerotic heart disease of native coronary artery without angina pectoris: Secondary | ICD-10-CM | POA: Diagnosis not present

## 2019-01-02 DIAGNOSIS — I10 Essential (primary) hypertension: Secondary | ICD-10-CM

## 2019-01-02 NOTE — Patient Instructions (Signed)
Medication Instructions:  Your physician recommends that you continue on your current medications as directed. Please refer to the Current Medication list given to you today.  If you need a refill on your cardiac medications before your next appointment, please call your pharmacy.   Lab work: NONE ORDERED  TODAY   If you have labs (blood work) drawn today and your tests are completely normal, you will receive your results only by: Marland Kitchen MyChart Message (if you have MyChart) OR . A paper copy in the mail If you have any lab test that is abnormal or we need to change your treatment, we will call you to review the results.  Testing/Procedures: NONE ORDERED  TODAY   Follow-Up: At [facility], you and your health needs are our priority.  As part of our continuing mission to provide you with exceptional heart care, we have created designated Provider Care Teams.  These Care Teams include your primary Cardiologist (physician) and Advanced Practice Providers (APPs -  Physician Assistants and Nurse Practitioners) who all work together to provide you with the care you need, when you need it. You will need a follow up appointment in:  3 -4  months.  Please call our office 2 months in advance to schedule this appointment.  You may see Dorris Carnes, MD or one of the following Advanced Practice Providers on your designated Care Team: Richardson Dopp, PA-C [place], [place] . Daune Perch, NP  Any Other Special Instructions Will Be Listed Below (If Applicable).

## 2019-01-15 DIAGNOSIS — N5201 Erectile dysfunction due to arterial insufficiency: Secondary | ICD-10-CM | POA: Diagnosis not present

## 2019-01-26 ENCOUNTER — Other Ambulatory Visit: Payer: Self-pay | Admitting: Internal Medicine

## 2019-01-26 ENCOUNTER — Other Ambulatory Visit: Payer: Self-pay | Admitting: Family

## 2019-02-13 MED FILL — SHINGRIX 50 MCG SUS: 50 | 1 days supply | Qty: 1 | Fill #1

## 2019-02-22 ENCOUNTER — Other Ambulatory Visit: Payer: Self-pay | Admitting: Internal Medicine

## 2019-02-28 ENCOUNTER — Other Ambulatory Visit: Payer: Self-pay | Admitting: Family

## 2019-02-28 MED ORDER — FUROSEMIDE 40 MG PO TABS: 40.0000 mg | ORAL_TABLET | Freq: Every day | ORAL | 0 refills | Status: DC

## 2019-02-28 NOTE — Telephone Encounter (Signed)
Requested medication (s) are due for refill today: yes  Requested medication (s) are on the active medication list: yes  Last refill:  12/17/2018  Future visit scheduled: yes  Notes to clinic: prescribed by Dr [name] [place]   Requested Prescriptions  Pending Prescriptions Disp Refills   furosemide (LASIX) 40 MG tablet 90 tablet 0    Sig: Take 1 tablet (40 mg total) by mouth daily.     Cardiovascular:  Diuretics - Loop Failed - 02/28/2019 11:05 AM      Failed - Cr in normal range and within 360 days    Creat  Date Value Ref Range Status  12/22/2018 1.21 (H) 0.70 - 1.18 mg/dL Final    Comment:    For patients >42 years of age, the reference limit for Creatinine is approximately 13% higher for people identified as African-American. .          Passed - K in normal range and within 360 days    Potassium  Date Value Ref Range Status  12/22/2018 4.3 3.5 - 5.3 mmol/L Final         Passed - Ca in normal range and within 360 days    Calcium  Date Value Ref Range Status  12/22/2018 9.2 8.6 - 10.3 mg/dL Final         Passed - Na in normal range and within 360 days    Sodium  Date Value Ref Range Status  12/22/2018 141 135 - 146 mmol/L Final         Passed - Last BP in normal range    BP Readings from Last 1 Encounters:  01/02/19 110/62         Passed - Valid encounter within last 6 months    Recent Outpatient Visits          2 months ago Chronic systolic congestive heart failure ([place])   Archivist at [place], NP   2 months ago SOB (shortness of breath)   Archivist at [place], NP   3 months ago Hyperglycemia   Archivist at [place], NP   4 months ago Tick bite, subsequent Loss adjuster, chartered at [place], NP   6 months ago Essential hypertension   Arts development officer at [place], NP      Future Appointments            In 3 weeks Debbrah Alar, NP [employer] at [employer], [place]   In 3 weeks [place], MD [place], [facility]

## 2019-02-28 NOTE — Telephone Encounter (Signed)
Medication Refill - Medication: Furosemide  Has the patient contacted their pharmacy? Yes.   Prescribed by another provider. Pt saw this provider in the hospital. Please advise.  (Agent: If no, request that the patient contact the pharmacy for the refill.) (Agent: If yes, when and what did the pharmacy advise?)  Preferred Pharmacy (with phone number or street name):  [facility], [address]  [place]. [place] [address]  Phone: [phone] Fax: [phone]  Not a 24 hour pharmacy; exact hours not known.     Agent: Please be advised that RX refills may take up to 3 business days. We ask that you follow-up with your pharmacy.

## 2019-03-23 ENCOUNTER — Telehealth (INDEPENDENT_AMBULATORY_CARE_PROVIDER_SITE_OTHER): Payer: Medicare Other | Admitting: Family

## 2019-03-23 ENCOUNTER — Encounter: Payer: Self-pay | Admitting: Family

## 2019-03-23 ENCOUNTER — Other Ambulatory Visit: Payer: Self-pay

## 2019-03-23 VITALS — BP 116/65 | HR 66 | Wt 210.5 lb

## 2019-03-23 DIAGNOSIS — I2510 Atherosclerotic heart disease of native coronary artery without angina pectoris: Secondary | ICD-10-CM | POA: Diagnosis not present

## 2019-03-23 DIAGNOSIS — E785 Hyperlipidemia, unspecified: Secondary | ICD-10-CM | POA: Diagnosis not present

## 2019-03-23 DIAGNOSIS — I5023 Acute on chronic systolic (congestive) heart failure: Secondary | ICD-10-CM

## 2019-03-23 DIAGNOSIS — I2584 Coronary atherosclerosis due to calcified coronary lesion: Secondary | ICD-10-CM

## 2019-03-23 DIAGNOSIS — E1159 Type 2 diabetes mellitus with other circulatory complications: Secondary | ICD-10-CM

## 2019-03-23 DIAGNOSIS — I10 Essential (primary) hypertension: Secondary | ICD-10-CM | POA: Diagnosis not present

## 2019-03-23 DIAGNOSIS — I152 Hypertension secondary to endocrine disorders: Secondary | ICD-10-CM

## 2019-03-23 NOTE — Progress Notes (Addendum)
Virtual Visit via Video Note  I connected with@ on 03/23/19 at  7:00 AM EST by a video enabled telemedicine application and verified that I am speaking with the correct person using two identifiers. This visit type was conducted due to national recommendations for restrictions regarding the COVID-19 Pandemic (e.g. social distancing).  This format is felt to be most appropriate for this patient at this time.   I discussed the limitations of evaluation and management by telemedicine and the availability of in person appointments. The patient expressed understanding and agreed to proceed.  Only the patient and myself were on today's video visit. The patient was at home and I was in my office at the time of today's visit.   History of Present Illness:  Patient is a 73 yr old male who presents today for follow up.    Reports that he and his wife walk 2 miles 5 of 7 days.   HTN- carvedilol, spironolactone and lasix.  Reports bp stable at home.   CHF- maintained on lasix 40mg .  Denies LE edema.  He is taking kdur regularly.    Lab Results  Component Value Date   CHOL 136 02/28/2018   HDL 61.50 02/28/2018   LDLCALC 58 02/28/2018   TRIG 80.0 02/28/2018   CHOLHDL 2 02/28/2018   Hyperlipidemia- continues statin.   Lab Results  Component Value Date   CHOL 136 02/28/2018   HDL 61.50 02/28/2018   LDLCALC 58 02/28/2018   TRIG 80.0 02/28/2018   CHOLHDL 2 02/28/2018   Past Medical History:  Diagnosis Date  . Anomalous right coronary artery   . Chronic systolic CHF (congestive heart failure) ([place]) 08/18/2016   Echo 1/17:  Diff HK especially in inf wall, mild LVH, EF 30-35, mild AS (mean 10, peak 18) // Echo 11/15: Mild LVH, EF 30-35, moderate HK, Gr 1 DD, mild AI, mild RAE // Echo 8/18: Mild LVH, EF 30-35, diffuse HK, mild aortic stenosis (mean 7), mild AI, mild BAE  . Coronary artery calcification 08/18/2016   Cor CTA 1/08: Ca score 248 // LHC 1/08: oLAD 25%  . History of ETOH abuse   .  Hyperlipidemia 08/27/2013  . Hypertension   . Non-ischemic cardiomyopathy ([place])    Presumed secondary to Etoh and HTN  (Prev EF 20-25% -- improved to 50%) // LHC 1/08: oLAD 25, EF 25%   . OSA (obstructive sleep apnea) 10/17/2013   Severe per home sleep study performed on 09/26/13.      Social History   Socioeconomic History  . Marital status: Married    Spouse name: Constance Holster  . Number of children: 3  . Years of education: Not on file  . Highest education level: Not on file  Occupational History  . Occupation: Retired Development worker, community  Tobacco Use  . Smoking status: Former Smoker    Years: 20.00    Quit date: 04/12/2002    Years since quitting: 16.9  . Smokeless tobacco: Never Used  Substance and Sexual Activity  . Alcohol use: Yes    Alcohol/week: 7.0 standard drinks    Types: 7 Glasses of wine per week    Comment: 1 glass of wine each night.    . Drug use: No    Comment: history of marijuana use  . Sexual activity: Yes  Other Topics Concern  . Not on file  Social History Narrative   2 sons and daughter   1 son in [employer]. Living with wife 1yrs Constance Holster).  Use to live near  [place].   Social Determinants of Health   Financial Resource Strain:   . Difficulty of Paying Living Expenses: Not on file  Food Insecurity:   . Worried About Programme researcher, broadcasting/film/video in the Last Year: Not on file  . Ran Out of Food in the Last Year: Not on file  Transportation Needs:   . Lack of Transportation (Medical): Not on file  . Lack of Transportation (Non-Medical): Not on file  Physical Activity:   . Days of Exercise per Week: Not on file  . Minutes of Exercise per Session: Not on file  Stress:   . Feeling of Stress : Not on file  Social Connections:   . Frequency of Communication with Friends and Family: Not on file  . Frequency of Social Gatherings with Friends and Family: Not on file  . Attends Religious Services: Not on file  . Active Member of Clubs or Organizations: Not on file  . Attends Tax inspector Meetings: Not on file  . Marital Status: Not on file  Intimate Partner Violence:   . Fear of Current or Ex-Partner: Not on file  . Emotionally Abused: Not on file  . Physically Abused: Not on file  . Sexually Abused: Not on file    No past surgical history on file.  Family History  Problem Relation Age of Onset  . Alcohol abuse Mother   . Heart disease Mother   . Alcohol abuse Father   . Heart failure Father   . Heart disease Maternal Grandmother   . Stroke Maternal Grandmother   . Diabetes Neg Hx     Allergies  Allergen Reactions  . Lisinopril Swelling    ANGIOEDEMA    Current Outpatient Medications on File Prior to Visit  Medication Sig Dispense Refill  . aspirin EC 81 MG tablet Take 1 tablet (81 mg total) by mouth daily. 90 tablet 3  . atorvastatin (LIPITOR) 10 MG tablet TAKE 1 TABLET BY MOUTH ONCE DAILY AT  6  00  PM 90 tablet 3  . carvedilol (COREG) 25 MG tablet Take 1 tablet by mouth twice daily 180 tablet 0  . diphenhydrAMINE (BENADRYL) 25 MG tablet Take 50 mg by mouth every 6 (six) hours as needed.    . furosemide (LASIX) 40 MG tablet Take 1 tablet (40 mg total) by mouth daily. 90 tablet 0  . Multiple Vitamins-Minerals (PRESERVISION AREDS 2+MULTI VIT PO) Take 1 tablet by mouth daily.    . potassium chloride (KLOR-CON) 10 MEQ tablet TAKE 1  BY MOUTH ONCE DAILY 90 tablet 0  . spironolactone (ALDACTONE) 25 MG tablet Take 1/2 (one-half) tablet by mouth once daily 45 tablet 2   No current facility-administered medications on file prior to visit.    BP 116/65   Pulse 66   Wt 210 lb 8 oz (95.5 kg)   BMI 31.09 kg/m     Observations/Objective:   Gen: Awake, alert, no acute distress Resp: Breathing is even and non-labored Psych: calm/pleasant demeanor Neuro: Alert and Oriented x 3, + facial symmetry, speech is clear.  Assessment and Plan:  HTN- bp stable on current medications- continue same.  Hyperlipidemia- tolerating statin, obtain follow up  lipid panel.  CHF- clinically euvolemic. Continue current dose of lasix.    Follow Up Instructions:    I discussed the assessment and treatment plan with the patient. The patient was provided an opportunity to ask questions and all were answered. The patient agreed with the plan and demonstrated an  understanding of the instructions.   The patient was advised to call back or seek an in-person evaluation if the symptoms worsen or if the condition fails to improve as anticipated.    Lemont Fillers, NP  Addendum: pt has home CPAP due to hx of severe OSA and should continue nightly cpap use.

## 2019-03-25 NOTE — Progress Notes (Signed)
Cardiology Office Note   Date:  03/26/2019   ID:  [name], [name] 03-09-1946, MRN [id number]  PCP:  Debbrah Alar, NP  Cardiologist:   Dorris Carnes, MD   F/U of [facility]     History of Present Illness: [name] is a 73 y.o. male with a history of nonischemic cardiomyopathy, HTN, OSA 9on CPAP), mild AS.  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 aspirin EC 81 MG tablet Take 1 tablet (81 mg total) by mouth daily.  Marland Kitchen atorvastatin (LIPITOR) 10 MG tablet TAKE 1 TABLET BY MOUTH ONCE DAILY AT  6  00  PM  . carvedilol (COREG) 25 MG tablet Take 1 tablet by mouth twice daily  . diphenhydrAMINE (BENADRYL) 25 MG tablet Take 50 mg by mouth every 6 (six) hours as needed.  . furosemide (LASIX) 40 MG tablet Take 1 tablet (40 mg total) by mouth daily.  . Multiple Vitamins-Minerals (PRESERVISION AREDS 2+MULTI VIT PO) Take 1 tablet by mouth daily.  . potassium chloride (KLOR-CON) 10 MEQ tablet TAKE 1  BY MOUTH ONCE DAILY  . spironolactone (ALDACTONE) 25 MG tablet Take 1/2 (one-half) tablet by mouth once daily     Allergies:   Lisinopril   Past Medical History:  Diagnosis Date  . Anomalous right coronary artery   . Chronic systolic CHF (congestive heart failure) ([facility]) 08/18/2016   Echo 1/17:  Diff HK especially in inf wall, mild LVH, EF 30-35, mild AS (mean 10, peak 18) // Echo 11/15: Mild LVH, EF 30-35, moderate HK, Gr 1 DD, mild AI, mild RAE // Echo 8/18: Mild LVH, EF 30-35, diffuse HK, mild aortic  stenosis (mean 7), mild AI, mild BAE  . Coronary artery calcification 08/18/2016   Cor CTA 1/08: Ca score 248 // LHC 1/08: oLAD 25%  . History of ETOH abuse   . Hyperlipidemia 08/27/2013  . Hypertension   . Non-ischemic cardiomyopathy ([facility])    Presumed secondary to Etoh and HTN  (Prev EF 20-25% -- improved to 50%) // LHC 1/08: oLAD 25, EF 25%   . OSA (obstructive sleep apnea) 10/17/2013   Severe per home sleep study performed on 09/26/13.     No past surgical history on file.   Social History:  The patient  reports that he quit smoking about 16 years ago. He quit after 20.00 years of use. He has never used smokeless tobacco. He reports current alcohol use of about 7.0 standard drinks of alcohol per week. He reports that he does not use drugs.   Family History:  The patient's family history includes Alcohol abuse in his father and mother; Heart disease in his maternal grandmother and mother; Heart failure in his father; Stroke in his maternal grandmother.  ROS:  Please see the history of present illness. All other systems are reviewed and  Negative to the above problem except as noted.    PHYSICAL EXAM: VS:  BP 120/68   Pulse 90   Ht 5\' 9"  (1.753 m)   Wt 218 lb 12.8 oz (99.2 kg)   SpO2 97%   BMI 32.31 kg/m   GEN: Well nourished, well developed, in no acute distress  HEENT: normal  Neck: JVP normla  No carotid bruits, or masses Cardiac: RRR; no murmurs, rubs, or gallops,no edema  Respiratory:  clear to auscultation bilaterally, normal work of breathing GI: soft, nontender, nondistended, + BS  No hepatomegaly  MS: no deformity Moving all extremities   Skin: warm and dry, no rash Neuro:  Strength and sensation are intact Psych: euthymic mood, full affect   EKG:  EKG is not ordered today.     Lipid Panel    Component Value Date/Time   CHOL 136 02/28/2018 0831   TRIG 80.0 02/28/2018 0831   HDL 61.50 02/28/2018 0831   CHOLHDL 2 02/28/2018 0831   VLDL 16.0 02/28/2018 0831    LDLCALC 58 02/28/2018 0831      Wt Readings from Last 3 Encounters:  03/26/19 218 lb 12.8 oz (99.2 kg)  03/23/19 210 lb 8 oz (95.5 kg)  01/02/19 212 lb 1.9 oz (96.2 kg)      ASSESSMENT AND PLAN:  1  Chronic systolic CHF Volume status is good  He is Class I NYHA heart failure  Will get labs   Review meds    He is not on Hydralazine / NTG    Will review and contact    Check CBC and BMET  2  Lipids   Check lipids today   3  HTN  BP is good    Follow      F/U in 8 months    Current medicines are reviewed at length with the patient today.  The patient does not have concerns regarding medicines.  Signed, 01/04/19, MD  03/26/2019 9:55 PM    [facility] [address] [place], [place], [place]  [place] Phone: [phone]; Fax: [phone]

## 2019-03-26 ENCOUNTER — Ambulatory Visit (INDEPENDENT_AMBULATORY_CARE_PROVIDER_SITE_OTHER): Payer: Medicare Other | Admitting: Internal Medicine

## 2019-03-26 ENCOUNTER — Encounter: Payer: Self-pay | Admitting: Internal Medicine

## 2019-03-26 ENCOUNTER — Other Ambulatory Visit: Payer: Self-pay

## 2019-03-26 VITALS — BP 120/68 | HR 90 | Ht 69.0 in | Wt 218.8 lb

## 2019-03-26 DIAGNOSIS — I10 Essential (primary) hypertension: Secondary | ICD-10-CM | POA: Diagnosis not present

## 2019-03-26 DIAGNOSIS — E782 Mixed hyperlipidemia: Secondary | ICD-10-CM

## 2019-03-26 DIAGNOSIS — I5043 Acute on chronic combined systolic (congestive) and diastolic (congestive) heart failure: Secondary | ICD-10-CM | POA: Diagnosis not present

## 2019-03-26 DIAGNOSIS — I2510 Atherosclerotic heart disease of native coronary artery without angina pectoris: Secondary | ICD-10-CM

## 2019-03-26 DIAGNOSIS — I2584 Coronary atherosclerosis due to calcified coronary lesion: Secondary | ICD-10-CM | POA: Diagnosis not present

## 2019-03-26 NOTE — Patient Instructions (Signed)
Medication Instructions:  No changes *If you need a refill on your cardiac medications before your next appointment, please call your pharmacy*  Lab Work: At PCP [facility] prescription with you (for CBC, BMET, Lipids) If you have labs (blood work) drawn today and your tests are completely normal, you will receive your results only by: Marland Kitchen MyChart Message (if you have MyChart) OR . A paper copy in the mail If you have any lab test that is abnormal or we need to change your treatment, we will call you to review the results.  Testing/Procedures: none  Follow-Up: At [facility], you and your health needs are our priority.  As part of our continuing mission to provide you with exceptional heart care, we have created designated Provider Care Teams.  These Care Teams include your primary Cardiologist (physician) and Advanced Practice Providers (APPs -  Physician Assistants and Nurse Practitioners) who all work together to provide you with the care you need, when you need it.  Your next appointment:   5 month(s)  The format for your next appointment:   Either In Person or Virtual  Provider:   You may see Dorris Carnes, MD or one of the following Advanced Practice Providers on your designated Care Team:    Richardson Dopp, PA-C  [place], [place]  Daune Perch, NP   Other Instructions

## 2019-04-16 ENCOUNTER — Other Ambulatory Visit: Payer: Self-pay

## 2019-04-16 ENCOUNTER — Other Ambulatory Visit (INDEPENDENT_AMBULATORY_CARE_PROVIDER_SITE_OTHER): Payer: Medicare Other

## 2019-04-16 DIAGNOSIS — I428 Other cardiomyopathies: Secondary | ICD-10-CM

## 2019-04-16 DIAGNOSIS — E785 Hyperlipidemia, unspecified: Secondary | ICD-10-CM | POA: Diagnosis not present

## 2019-04-16 DIAGNOSIS — I10 Essential (primary) hypertension: Secondary | ICD-10-CM | POA: Diagnosis not present

## 2019-04-16 DIAGNOSIS — E1159 Type 2 diabetes mellitus with other circulatory complications: Secondary | ICD-10-CM | POA: Diagnosis not present

## 2019-04-16 DIAGNOSIS — I152 Hypertension secondary to endocrine disorders: Secondary | ICD-10-CM

## 2019-04-16 LAB — LIPID PANEL
Cholesterol: 161 mg/dL (ref 0–200)
HDL: 63.8 mg/dL (ref >39.00)
LDL Cholesterol: 70 mg/dL (ref 0–99)
NonHDL: 97.59
Total CHOL/HDL Ratio: 3
Triglycerides: 140 mg/dL (ref 0.0–149.0)
VLDL: 28 mg/dL (ref 0.0–40.0)

## 2019-04-16 LAB — CBC
HCT: 50.3 % (ref 39.0–52.0)
Hemoglobin: 16.4 g/dL (ref 13.0–17.0)
MCHC: 32.5 g/dL (ref 30.0–36.0)
MCV: 86.9 fl (ref 78.0–100.0)
Platelets: 122 10*3/uL — ABNORMAL LOW (ref 150.0–400.0)
RBC: 5.79 Mil/uL (ref 4.22–5.81)
RDW: 14.9 % (ref 11.5–15.5)
WBC: 5.1 10*3/uL (ref 4.0–10.5)

## 2019-04-16 LAB — COMPREHENSIVE METABOLIC PANEL
ALT: 18 U/L (ref 0–53)
AST: 16 U/L (ref 0–37)
Albumin: 4.1 g/dL (ref 3.5–5.2)
Alkaline Phosphatase: 44 U/L (ref 39–117)
BUN: 16 mg/dL (ref 6–23)
CO2: 30 mEq/L (ref 19–32)
Calcium: 9.5 mg/dL (ref 8.4–10.5)
Chloride: 101 mEq/L (ref 96–112)
Creatinine, Ser: 1.22 mg/dL (ref 0.40–1.50)
GFR: 70.6 mL/min (ref >60.00)
Glucose, Bld: 129 mg/dL — ABNORMAL HIGH (ref 70–99)
Potassium: 3.6 mEq/L (ref 3.5–5.1)
Sodium: 141 mEq/L (ref 135–145)
Total Bilirubin: 0.7 mg/dL (ref 0.2–1.2)
Total Protein: 7.1 g/dL (ref 6.0–8.3)

## 2019-04-16 NOTE — Addendum Note (Signed)
Addended by: Mervin Kung A on: 04/16/2019 08:36 AM   Modules accepted: Orders

## 2019-04-22 ENCOUNTER — Other Ambulatory Visit: Payer: Self-pay | Admitting: Family

## 2019-05-09 ENCOUNTER — Other Ambulatory Visit: Payer: Self-pay | Admitting: Family

## 2019-05-22 ENCOUNTER — Telehealth: Payer: Self-pay | Admitting: Family

## 2019-05-22 NOTE — Telephone Encounter (Signed)
Caller Name: Riot Phone: [phone]  Pt called stating [facility] sent a fax yesterday they need additional documentation from the office before they can provide new CPAP for the pt. Please advise.

## 2019-05-22 NOTE — Telephone Encounter (Signed)
Dalencia from [facility] said yesterday, they need documentation on last ov note stating the need for patient to use a CPAP. If an addendum can not be done patient will need a new ov and a new rx.  Please advise,.

## 2019-05-22 NOTE — Telephone Encounter (Signed)
Addendum has been made

## 2019-05-24 ENCOUNTER — Telehealth: Payer: Self-pay

## 2019-05-24 NOTE — Telephone Encounter (Signed)
Patient called in to see if Dr. Lendell Caprice would send in a prescription to [facility]. Also [facility] needs verification that the patient uses it every night and when he travels.   [facility] Telephone number is  [phone]    Please follow up with the patient at [phone]  Thanks,

## 2019-05-25 ENCOUNTER — Ambulatory Visit: Payer: Medicare Other | Attending: Internal Medicine

## 2019-05-25 DIAGNOSIS — Z23 Encounter for immunization: Secondary | ICD-10-CM

## 2019-05-25 NOTE — Progress Notes (Signed)
   Covid-19 Vaccination Clinic  Name:  [name]    MRN: [id number] DOB: 01/31/1946  05/25/2019  [name] was observed post Covid-19 immunization for 15 minutes without incidence. He was provided with Vaccine Information Sheet and instruction to access the V-Safe system.   [name] was instructed to call 911 with any severe reactions post vaccine: Marland Kitchen Difficulty breathing  . Swelling of your face and throat  . A fast heartbeat  . A bad rash all over your body  . Dizziness and weakness    Immunizations Administered    Name Date Dose VIS Date Route   Pfizer COVID-19 Vaccine 05/25/2019  5:50 PM 0.3 mL 03/23/2019 Intramuscular   Manufacturer: [employer], [employer]   Lot: [id number]   NDC: [address]-8

## 2019-05-29 NOTE — Telephone Encounter (Signed)
[name] has form and information

## 2019-06-01 ENCOUNTER — Telehealth: Payer: Self-pay | Admitting: Family

## 2019-06-01 NOTE — Telephone Encounter (Signed)
Patient states that he needs a letter sent to [facility]  (where patient receives CPap machine). Patient states that he needs a new machine due to his five year begin up on the old one. Patient states that [facility] needs a note that states he use CPap and it's medical nessacary.   Patient states [facility] needs letter  by 06/10/2019.  [facility]  # [phone], [phone]

## 2019-06-04 NOTE — Telephone Encounter (Signed)
Lets schedule a virtual visit please.

## 2019-06-05 NOTE — Telephone Encounter (Signed)
Called pt to schedule Virtual visit and appt has already been made. He had not been made aware of appt time yet but states he is agreeable for mychart video visit on Friday at 10:40am.

## 2019-06-08 ENCOUNTER — Encounter: Payer: Self-pay | Admitting: Family

## 2019-06-08 ENCOUNTER — Telehealth: Payer: Self-pay | Admitting: Family

## 2019-06-08 ENCOUNTER — Ambulatory Visit (INDEPENDENT_AMBULATORY_CARE_PROVIDER_SITE_OTHER): Payer: Medicare Other | Admitting: Family

## 2019-06-08 DIAGNOSIS — G4733 Obstructive sleep apnea (adult) (pediatric): Secondary | ICD-10-CM

## 2019-06-08 NOTE — Telephone Encounter (Signed)
Please send today's office note to [facility] to document today's sleep apnea visit.  Pt states that the fax number there is fax [phone] [facility]

## 2019-06-08 NOTE — Progress Notes (Signed)
Virtual Visit via Video Note  I connected with [name] on 06/08/19 at 10:40 AM EST by a video enabled telemedicine application and verified that I am speaking with the correct person using two identifiers.  Location: Patient: home Provider: work   I discussed the limitations of evaluation and management by telemedicine and the availability of in person appointments. The patient expressed understanding and agreed to proceed.  History of Present Illness:  Patient is a 74 yr old male who presents today for follow up.  OSA- Patient reports that he uses cpap every night and takes it with him when he travels. Reports that he gets 7 hrs/night at least.  Reports no obvious leak.  Reports that he wakes up and feels ready.  Settings are as follows:   CPAP titration 12/19/13- CPAP of 13 centimeters with a medium fullface mask.  Past Medical History:  Diagnosis Date  . Anomalous right coronary artery   . Chronic systolic CHF (congestive heart failure) ([facility]) 08/18/2016   Echo 1/17:  Diff HK especially in inf wall, mild LVH, EF 30-35, mild AS (mean 10, peak 18) // Echo 11/15: Mild LVH, EF 30-35, moderate HK, Gr 1 DD, mild AI, mild RAE // Echo 8/18: Mild LVH, EF 30-35, diffuse HK, mild aortic stenosis (mean 7), mild AI, mild BAE  . Coronary artery calcification 08/18/2016   Cor CTA 1/08: Ca score 248 // LHC 1/08: oLAD 25%  . History of ETOH abuse   . Hyperlipidemia 08/27/2013  . Hypertension   . Non-ischemic cardiomyopathy ([facility])    Presumed secondary to Etoh and HTN  (Prev EF 20-25% -- improved to 50%) // LHC 1/08: oLAD 25, EF 25%   . OSA (obstructive sleep apnea) 10/17/2013   Severe per home sleep study performed on 09/26/13.      Social History   Socioeconomic History  . Marital status: Married    Spouse name: [name]  . Number of children: 3  . Years of education: Not on file  . Highest education level: Not on file  Occupational History  . Occupation: Retired Radiographer, therapeutic  Tobacco Use  . Smoking  status: Former Smoker    Years: 20.00    Quit date: 04/12/2002    Years since quitting: 17.1  . Smokeless tobacco: Never Used  Substance and Sexual Activity  . Alcohol use: Yes    Alcohol/week: 7.0 standard drinks    Types: 7 Glasses of wine per week    Comment: 1 glass of wine each night.    . Drug use: No    Comment: history of marijuana use  . Sexual activity: Yes  Other Topics Concern  . Not on file  Social History Narrative   2 sons and daughter   1 son in [name]. Living with wife 32yrs [name]).  Use to live near [place].   Social Determinants of Health   Financial Resource Strain:   . Difficulty of Paying Living Expenses: Not on file  Food Insecurity:   . Worried About Programme researcher, broadcasting/film/video in the Last Year: Not on file  . Ran Out of Food in the Last Year: Not on file  Transportation Needs:   . Lack of Transportation (Medical): Not on file  . Lack of Transportation (Non-Medical): Not on file  Physical Activity:   . Days of Exercise per Week: Not on file  . Minutes of Exercise per Session: Not on file  Stress:   . Feeling of Stress : Not on file  Social Connections:   . Frequency of Communication with Friends and Family: Not on file  . Frequency of Social Gatherings with Friends and Family: Not on file  . Attends Religious Services: Not on file  . Active Member of Clubs or Organizations: Not on file  . Attends Archivist Meetings: Not on file  . Marital Status: Not on file  Intimate Partner Violence:   . Fear of Current or Ex-Partner: Not on file  . Emotionally Abused: Not on file  . Physically Abused: Not on file  . Sexually Abused: Not on file    No past surgical history on file.  Family History  Problem Relation Age of Onset  . Alcohol abuse Mother   . Heart disease Mother   . Alcohol abuse Father   . Heart failure Father   . Heart disease Maternal Grandmother   . Stroke Maternal Grandmother   . Diabetes Neg Hx     Allergies  Allergen  Reactions  . Lisinopril Swelling    ANGIOEDEMA    Current Outpatient Medications on File Prior to Visit  Medication Sig Dispense Refill  . aspirin EC 81 MG tablet Take 1 tablet (81 mg total) by mouth daily. 90 tablet 3  . atorvastatin (LIPITOR) 10 MG tablet TAKE 1 TABLET BY MOUTH ONCE DAILY AT  6  00  PM 90 tablet 3  . carvedilol (COREG) 25 MG tablet Take 1 tablet by mouth twice daily 180 tablet 0  . diphenhydrAMINE (BENADRYL) 25 MG tablet Take 50 mg by mouth every 6 (six) hours as needed.    . furosemide (LASIX) 40 MG tablet Take 1 tablet by mouth once daily 90 tablet 0  . Multiple Vitamins-Minerals (PRESERVISION AREDS 2+MULTI VIT PO) Take 1 tablet by mouth daily.    . potassium chloride (KLOR-CON) 10 MEQ tablet TAKE 1  BY MOUTH ONCE DAILY 90 tablet 0  . spironolactone (ALDACTONE) 25 MG tablet Take 1/2 (one-half) tablet by mouth once daily 45 tablet 2   No current facility-administered medications on file prior to visit.    There were no vitals taken for this visit.      Observations/Objective:   Gen: Awake, alert, no acute distress Resp: Breathing is even and non-labored Psych: calm/pleasant demeanor Neuro: Alert and Oriented x 3, + facial symmetry, speech is clear.   Assessment and Plan:  Obstructive sleep apnea-patient reports great compliance.  He wakes up feeling rested.  We will request a download report from his provider.  He needs additional supplies and we will fax him a copy of today's note for insurance purposes.    Follow Up Instructions:    I discussed the assessment and treatment plan with the patient. The patient was provided an opportunity to ask questions and all were answered. The patient agreed with the plan and demonstrated an understanding of the instructions.   The patient was advised to call back or seek an in-person evaluation if the symptoms worsen or if the condition fails to improve as anticipated.  Nance Pear, NP

## 2019-06-08 NOTE — Telephone Encounter (Signed)
Office visit faxed to [facility] with request to fax download report for the past 3 months.

## 2019-06-08 NOTE — Telephone Encounter (Signed)
Also, can we request a download report from his cpap from [facility] please?

## 2019-06-17 ENCOUNTER — Ambulatory Visit: Payer: Medicare Other | Attending: Internal Medicine

## 2019-06-17 DIAGNOSIS — Z23 Encounter for immunization: Secondary | ICD-10-CM | POA: Insufficient documentation

## 2019-06-17 NOTE — Progress Notes (Signed)
   Covid-19 Vaccination Clinic  Name:  [name]    MRN: [id number] DOB: Dec 28, 1945  06/17/2019  [name] was observed post Covid-19 immunization for 15 minutes without incident. He was provided with Vaccine Information Sheet and instruction to access the V-Safe system.   [name] was instructed to call 911 with any severe reactions post vaccine: Marland Kitchen Difficulty breathing  . Swelling of face and throat  . A fast heartbeat  . A bad rash all over body  . Dizziness and weakness   Immunizations Administered    Name Date Dose VIS Date Route   Pfizer COVID-19 Vaccine 06/17/2019  8:38 AM 0.3 mL 03/23/2019 Intramuscular   Manufacturer: [employer], [employer]   Lot: [id number]   NDC: [address]-0

## 2019-07-09 ENCOUNTER — Telehealth: Payer: Self-pay | Admitting: Family

## 2019-07-09 ENCOUNTER — Other Ambulatory Visit: Payer: Self-pay

## 2019-07-09 MED ORDER — FUROSEMIDE 40 MG PO TABS: 40.0000 mg | ORAL_TABLET | Freq: Every day | ORAL | 0 refills | Status: DC

## 2019-07-09 NOTE — Telephone Encounter (Signed)
Medication: furosemide (LASIX) 40 MG tablet ( Pt willing to pay out of pocket)   Has the patient contacted their pharmacy? No. (If no, request that the patient contact the pharmacy for the refill.) (If yes, when and what did the pharmacy advise?)  Preferred Pharmacy (with phone number or street name):   [facility] [address], [place] - [address]  [address] [place] [address]   Phone:  [phone] Fax:  [phone]  Agent: Please be advised that RX refills may take up to 3 business days. We ask that you follow-up with your pharmacy.

## 2019-07-09 NOTE — Telephone Encounter (Signed)
Refill sent in

## 2019-07-25 ENCOUNTER — Other Ambulatory Visit: Payer: Self-pay | Admitting: Family

## 2019-07-31 DIAGNOSIS — N5201 Erectile dysfunction due to arterial insufficiency: Secondary | ICD-10-CM | POA: Diagnosis not present

## 2019-08-15 ENCOUNTER — Encounter (INDEPENDENT_AMBULATORY_CARE_PROVIDER_SITE_OTHER): Payer: Self-pay | Admitting: Bariatrics

## 2019-08-15 ENCOUNTER — Ambulatory Visit (INDEPENDENT_AMBULATORY_CARE_PROVIDER_SITE_OTHER): Payer: Medicare Other | Admitting: Bariatrics

## 2019-08-15 ENCOUNTER — Other Ambulatory Visit: Payer: Self-pay

## 2019-08-15 VITALS — BP 104/67 | HR 64 | Temp 98.4°F | Ht 69.0 in | Wt 223.0 lb

## 2019-08-15 DIAGNOSIS — R7989 Other specified abnormal findings of blood chemistry: Secondary | ICD-10-CM | POA: Diagnosis not present

## 2019-08-15 DIAGNOSIS — R0602 Shortness of breath: Secondary | ICD-10-CM

## 2019-08-15 DIAGNOSIS — E559 Vitamin D deficiency, unspecified: Secondary | ICD-10-CM

## 2019-08-15 DIAGNOSIS — Z1331 Encounter for screening for depression: Secondary | ICD-10-CM

## 2019-08-15 DIAGNOSIS — E669 Obesity, unspecified: Secondary | ICD-10-CM | POA: Diagnosis not present

## 2019-08-15 DIAGNOSIS — E7849 Other hyperlipidemia: Secondary | ICD-10-CM

## 2019-08-15 DIAGNOSIS — R739 Hyperglycemia, unspecified: Secondary | ICD-10-CM

## 2019-08-15 DIAGNOSIS — I5022 Chronic systolic (congestive) heart failure: Secondary | ICD-10-CM

## 2019-08-15 DIAGNOSIS — Z0289 Encounter for other administrative examinations: Secondary | ICD-10-CM

## 2019-08-15 DIAGNOSIS — I10 Essential (primary) hypertension: Secondary | ICD-10-CM | POA: Diagnosis not present

## 2019-08-15 DIAGNOSIS — G4733 Obstructive sleep apnea (adult) (pediatric): Secondary | ICD-10-CM | POA: Diagnosis not present

## 2019-08-15 DIAGNOSIS — E66811 Obesity, class 1: Secondary | ICD-10-CM

## 2019-08-15 DIAGNOSIS — Z6832 Body mass index (BMI) 32.0-32.9, adult: Secondary | ICD-10-CM | POA: Diagnosis not present

## 2019-08-15 DIAGNOSIS — R5383 Other fatigue: Secondary | ICD-10-CM

## 2019-08-15 NOTE — Progress Notes (Signed)
Chief Complaint:   OBESITY [name] (MR# [id number]) is a 74 y.o. male who presents for evaluation and treatment of obesity and related comorbidities. Current BMI is Body mass index is 32.93 kg/m.[name] has been struggling with his weight for many years and has been unsuccessful in either losing weight, maintaining weight loss, or reaching his healthy weight goal.  [name] is currently in the action stage of change and ready to dedicate time achieving and maintaining a healthier weight. [name] is interested in becoming our patient and working on intensive lifestyle modifications including (but not limited to) diet and exercise for weight loss.  [name] is lactose intolerant.  [name]'s habits were reviewed today and are as follows: His family eats meals together, he thinks his family will eat healthier with him, his desired weight loss is 68 lbs, he has been heavy most of his life, he started gaining weight between 1978 and 2008, his heaviest weight ever was 226 pounds and he skips meals frequently.  Depression Screen [name]'s Food and Mood (modified PHQ-9) score was 4.  Depression screen PHQ 2/9 08/15/2019  Decreased Interest 1  Down, Depressed, Hopeless 1  PHQ - 2 Score 2  Altered sleeping 1  Tired, decreased energy 1  Change in appetite 0  Feeling bad or failure about yourself  0  Trouble concentrating 0  Moving slowly or fidgety/restless 0  Suicidal thoughts 0  PHQ-9 Score 4  Difficult doing work/chores Not difficult at all   Subjective:   Other fatigue. [name] denies daytime somnolence and denies waking up still tired. [name] generally gets 6+ hours of sleep per night, and states that he has generally restful sleep. Snoring is not present with CPAP. Apneic episodes are not present with CPAP. Epworth Sleepiness Score is 1.  Shortness of breath on exertion. [name] notes increasing shortness of breath with certain activities and seems to be worsening over time with weight gain. He notes  getting out of breath sooner with activity than he used to. This has gotten worse recently. [name] denies shortness of breath at rest or orthopnea.  Essential hypertension. [name] takes spironolactone and carvedilol. Blood pressure is well controlled.  BP Readings from Last 3 Encounters:  08/15/19 104/67  03/26/19 120/68  03/23/19 116/65   Lab Results  Component Value Date   CREATININE 1.22 04/16/2019   CREATININE 1.21 (H) 12/22/2018   CREATININE 1.32 (H) 12/17/2018   Other hyperlipidemia. [name] takes atorvastatin.   Lab Results  Component Value Date   CHOL 161 04/16/2019   HDL 63.80 04/16/2019   LDLCALC 70 04/16/2019   TRIG 140.0 04/16/2019   CHOLHDL 3 04/16/2019   Lab Results  Component Value Date   ALT 18 04/16/2019   AST 16 04/16/2019   ALKPHOS 44 04/16/2019   BILITOT 0.7 04/16/2019   The 10-year ASCVD risk score Denman George DC Jr., et al., 2013) is: 22.2%   Values used to calculate the score:     Age: 20 years     Sex: Male     Is Non-Hispanic African American: Yes     Diabetic: Yes     Tobacco smoker: No     Systolic Blood Pressure: 104 mmHg     Is BP treated: Yes     HDL Cholesterol: 63.8 mg/dL     Total Cholesterol: 161 mg/dL  Obstructive sleep apnea syndrome. [name] wears CPAP and reports sleeping well.  Vitamin D deficiency. [name] is taking OTC Vitamin D.  Chronic systolic congestive  heart failure (). [name] is taking Lasix, spironolactone, and carvedilol.  Elevated LFTs. [name] has a new dx of elevated ALT. His BMI is over 40. He denies abdominal pain or jaundice and has never been told of any liver problems in the past. He denies excessive alcohol intake. His last lab results were normal.  Lab Results  Component Value Date   ALT 18 04/16/2019   AST 16 04/16/2019   ALKPHOS 44 04/16/2019   BILITOT 0.7 04/16/2019   Hyperglycemia. [name] has a history of some elevated blood glucose readings without a diagnosis of diabetes. He reports having a normal  appetite.  Depression screening. [name] had a negative depression screen with a PHQ-9 score of 4.  Assessment/Plan:   Other fatigue. [name] does feel that his weight is causing his energy to be lower than it should be. Fatigue may be related to obesity, depression or many other causes. Labs will be ordered, and in the meanwhile, [name] will focus on self care including making healthy food choices, increasing physical activity and focusing on stress reduction. EKG 12-Lead, T3, T4, free, TSH tests ordered today.  Shortness of breath on exertion. [name] does feel that he gets out of breath more easily that he used to when he exercises. [name]'s shortness of breath appears to be obesity related and exercise induced. He has agreed to work on weight loss and gradually increase exercise to treat his exercise induced shortness of breath. Will continue to monitor closely.  Essential hypertension. [name] is working on healthy weight loss and exercise to improve blood pressure control. We will watch for signs of hypotension as he continues his lifestyle modifications. He will continue his medications as directed.  Other hyperlipidemia. Cardiovascular risk and specific lipid/LDL goals reviewed.  We discussed several lifestyle modifications today and [name] will continue to work on diet, exercise and weight loss efforts. Orders and follow up as documented in patient record. He will continue his medication as directed.  Counseling Intensive lifestyle modifications are the first line treatment for this issue. . Dietary changes: Increase soluble fiber. Decrease simple carbohydrates. . Exercise changes: Moderate to vigorous-intensity aerobic activity 150 minutes per week if tolerated. . Lipid-lowering medications: see documented in medical record.  Obstructive sleep apnea syndrome. Intensive lifestyle modifications are the first line treatment for this issue. We discussed several lifestyle modifications today and he will  continue to work on diet, exercise and weight loss efforts. We will continue to monitor. Orders and follow up as documented in patient record. [name] will wear CPAP nightly.  Counseling  Sleep apnea is a condition in which breathing pauses or becomes shallow during sleep. This happens over and over during the night. This disrupts your sleep and keeps your body from getting the rest that it needs, which can cause tiredness and lack of energy (fatigue) during the day.  Sleep apnea treatment: If you were given a device to open your airway while you sleep, USE IT!  Sleep hygiene:   Limit or avoid alcohol, caffeinated beverages, and cigarettes, especially close to bedtime.   Do not eat a large meal or eat spicy foods right before bedtime. This can lead to digestive discomfort that can make it hard for you to sleep.  Keep a sleep diary to help you and your health care provider figure out what could be causing your insomnia.  . Make your bedroom a dark, comfortable place where it is easy to fall asleep. ? Put up shades or blackout curtains to block light  from outside. ? Use a white noise machine to block noise. ? Keep the temperature cool. . Limit screen use before bedtime. This includes: ? Watching TV. ? Using your smartphone, tablet, or computer. . Stick to a routine that includes going to bed and waking up at the same times every day and night. This can help you fall asleep faster. Consider making a quiet activity, such as reading, part of your nighttime routine. . Try to avoid taking naps during the day so that you sleep better at night. . Get out of bed if you are still awake after 15 minutes of trying to sleep. Keep the lights down, but try reading or doing a quiet activity. When you feel sleepy, go back to bed.  Vitamin D deficiency. Low Vitamin D level contributes to fatigue and are associated with obesity, breast, and colon cancer. VITAMIN D 25 Hydroxy (Vit-D Deficiency, Fractures) level  ordered today.  Chronic systolic congestive heart failure (HCC). [name] will follow-up with Cardiology every 6 months as scheduled.  Elevated LFTs. We discussed the likely diagnosis of non-alcoholic fatty liver disease today and how this condition is obesity related. [name] was educated the importance of weight loss. [name] agreed to continue with his weight loss efforts with healthier diet and exercise as an essential part of his treatment plan. Will check CMP today.  Hyperglycemia. Fasting labs will be obtained and results with be discussed with [name] in 2 weeks at his follow up visit. In the meanwhile [name] was started on a lower simple carbohydrate diet and will work on weight loss efforts. Hemoglobin A1c, Insulin, random, Comprehensive metabolic panel labs ordered today.  Depression screening. Treylin had a negative depression screen.  Class 1 obesity with serious comorbidity and body mass index (BMI) of 32.0 to 32.9 in adult, unspecified obesity type.  [name] is currently in the action stage of change and his goal is to continue with weight loss efforts. I recommend [name] begin the structured treatment plan as follows:  He has agreed to the Category 1 Plan.  He will work on meal planning and will decrease or eliminate alcohol use.  We independently reviewed with the patient labs from 04/16/2019 including CMP, lipids, CBC, and glucose.  Exercise goals: [name] will walk 3 miles on the treadmill daily.   Behavioral modification strategies: increasing lean protein intake, decreasing simple carbohydrates, increasing vegetables, increasing water intake, decreasing eating out, no skipping meals, meal planning and cooking strategies, keeping healthy foods in the home and planning for success.  He was informed of the importance of frequent follow-up visits to maximize his success with intensive lifestyle modifications for his multiple health conditions. He was informed we would discuss his lab results at his  next visit unless there is a critical issue that needs to be addressed sooner. [name] agreed to keep his next visit at the agreed upon time to discuss these results.  Objective:   Blood pressure 104/67, pulse 64, temperature 98.4 F (36.9 C), height 5\' 9"  (1.753 m), weight 223 lb (101.2 kg), SpO2 99 %. Body mass index is 32.93 kg/m.  EKG: Sinus  Rhythm with a rate of 82 BPM. First degree A-V block - frequent multiform ectopic ventricular beats. PRi = 234. # VECs = 3, # types 2. Left axis -anterior fascicular block. Left atrial enlargement. Nonspecific T-abnormality.  Indirect Calorimeter completed today shows a VO2 of 166 and a REE of 1157.  His calculated basal metabolic rate is thus his basal metabolic rate is  worse than expected.  General: Cooperative, alert, well developed, in no acute distress. HEENT: Conjunctivae and lids unremarkable. Cardiovascular: Regular rhythm.  Lungs: Normal work of breathing. Neurologic: No focal deficits.   Lab Results  Component Value Date   CREATININE 1.22 04/16/2019   BUN 16 04/16/2019   NA 141 04/16/2019   K 3.6 04/16/2019   CL 101 04/16/2019   CO2 30 04/16/2019   Lab Results  Component Value Date   ALT 18 04/16/2019   AST 16 04/16/2019   ALKPHOS 44 04/16/2019   BILITOT 0.7 04/16/2019   Lab Results  Component Value Date   HGBA1C 6.1 11/28/2018   HGBA1C 6.4 08/23/2017   HGBA1C 6.0 02/16/2017   HGBA1C 6.0 08/11/2016   HGBA1C 5.8 07/30/2015   No results found for: INSULIN Lab Results  Component Value Date   TSH 0.930 06/26/2009   Lab Results  Component Value Date   CHOL 161 04/16/2019   HDL 63.80 04/16/2019   LDLCALC 70 04/16/2019   TRIG 140.0 04/16/2019   CHOLHDL 3 04/16/2019   Lab Results  Component Value Date   WBC 5.1 04/16/2019   HGB 16.4 04/16/2019   HCT 50.3 04/16/2019   MCV 86.9 04/16/2019   PLT 122.0 (L) 04/16/2019   No results found for: IRON, TIBC, FERRITIN  Obesity Behavioral Intervention Visit  Documentation for Insurance:   Approximately 15 minutes were spent on the discussion below.  ASK: We discussed the diagnosis of obesity with [name] today and Jalin agreed to give [place] permission to discuss obesity behavioral modification therapy today.  ASSESS: Erion has the diagnosis of obesity and his BMI today is 32.9. Joseandres is in the action stage of change.   ADVISE: Destyn was educated on the multiple health risks of obesity as well as the benefit of weight loss to improve his health. He was advised of the need for long term treatment and the importance of lifestyle modifications to improve his current health and to decrease his risk of future health problems.  AGREE: Multiple dietary modification options and treatment options were discussed and Jazier agreed to follow the recommendations documented in the above note.  ARRANGE: Javontay was educated on the importance of frequent visits to treat obesity as outlined per CMS and USPSTF guidelines and agreed to schedule his next follow up appointment today.  Attestation Statements:   Reviewed by clinician on day of visit: allergies, medications, problem list, medical history, surgical history, family history, social history, and previous encounter notes.  Fernanda Drum, am acting as Energy manager for [employer], DO   I have reviewed the above documentation for accuracy and completeness, and I agree with the above. Corinna Capra, DO

## 2019-08-16 LAB — COMPREHENSIVE METABOLIC PANEL
ALT: 18 IU/L (ref 0–44)
AST: 15 IU/L (ref 0–40)
Albumin/Globulin Ratio: 1.5 (ref 1.2–2.2)
Albumin: 4.5 g/dL (ref 3.7–4.7)
Alkaline Phosphatase: 49 IU/L (ref 39–117)
BUN/Creatinine Ratio: 13 (ref 10–24)
BUN: 17 mg/dL (ref 8–27)
Bilirubin Total: 1 mg/dL (ref 0.0–1.2)
CO2: 24 mmol/L (ref 20–29)
Calcium: 9.7 mg/dL (ref 8.6–10.2)
Chloride: 100 mmol/L (ref 96–106)
Creatinine, Ser: 1.31 mg/dL — ABNORMAL HIGH (ref 0.76–1.27)
GFR calc Af Amer: 62 mL/min/{1.73_m2} (ref >59)
GFR calc non Af Amer: 54 mL/min/{1.73_m2} — ABNORMAL LOW (ref >59)
Globulin, Total: 3 g/dL (ref 1.5–4.5)
Glucose: 120 mg/dL — ABNORMAL HIGH (ref 65–99)
Potassium: 4.2 mmol/L (ref 3.5–5.2)
Sodium: 139 mmol/L (ref 134–144)
Total Protein: 7.5 g/dL (ref 6.0–8.5)

## 2019-08-16 LAB — TSH: TSH: 1.37 u[IU]/mL (ref 0.450–4.500)

## 2019-08-16 LAB — INSULIN, RANDOM: INSULIN: 10 u[IU]/mL (ref 2.6–24.9)

## 2019-08-16 LAB — HEMOGLOBIN A1C
Est. average glucose Bld gHb Est-mCnc: 128 mg/dL
Hgb A1c MFr Bld: 6.1 % — ABNORMAL HIGH (ref 4.8–5.6)

## 2019-08-16 LAB — T3: T3, Total: 117 ng/dL (ref 71–180)

## 2019-08-16 LAB — VITAMIN D 25 HYDROXY (VIT D DEFICIENCY, FRACTURES): Vit D, 25-Hydroxy: 43.3 ng/mL (ref 30.0–100.0)

## 2019-08-16 LAB — T4, FREE: Free T4: 1.54 ng/dL (ref 0.82–1.77)

## 2019-08-20 ENCOUNTER — Encounter (INDEPENDENT_AMBULATORY_CARE_PROVIDER_SITE_OTHER): Payer: Self-pay | Admitting: Bariatrics

## 2019-08-20 DIAGNOSIS — R7303 Prediabetes: Secondary | ICD-10-CM | POA: Insufficient documentation

## 2019-08-27 ENCOUNTER — Telehealth: Payer: Self-pay | Admitting: Family

## 2019-08-27 ENCOUNTER — Other Ambulatory Visit: Payer: Self-pay

## 2019-08-27 MED ORDER — FUROSEMIDE 40 MG PO TABS: 40.0000 mg | ORAL_TABLET | Freq: Every day | ORAL | 0 refills | Status: DC

## 2019-08-27 NOTE — Telephone Encounter (Signed)
Prescription was sent

## 2019-08-27 NOTE — Telephone Encounter (Signed)
Medication: furosemide (LASIX) 40 MG tablet [[id number]]    Has the patient contacted their pharmacy? No  (If no, request that the patient contact the pharmacy for the refill.) (If yes, when and what did the pharmacy advise?)  Preferred Pharmacy (with phone number or street name): [facility] [address] [place] - [address]  [address] [place] [address]  Phone:  [phone] Fax:  [phone]  DEA #:  --  Agent: Please be advised that RX refills may take up to 3 business days. We ask that you follow-up with your pharmacy.

## 2019-08-29 ENCOUNTER — Encounter (INDEPENDENT_AMBULATORY_CARE_PROVIDER_SITE_OTHER): Payer: Self-pay | Admitting: Bariatrics

## 2019-08-29 ENCOUNTER — Other Ambulatory Visit: Payer: Self-pay

## 2019-08-29 ENCOUNTER — Ambulatory Visit (INDEPENDENT_AMBULATORY_CARE_PROVIDER_SITE_OTHER): Payer: Medicare Other | Admitting: Bariatrics

## 2019-08-29 VITALS — BP 104/62 | HR 74 | Temp 98.1°F | Ht 69.0 in | Wt 212.0 lb

## 2019-08-29 DIAGNOSIS — E669 Obesity, unspecified: Secondary | ICD-10-CM

## 2019-08-29 DIAGNOSIS — E559 Vitamin D deficiency, unspecified: Secondary | ICD-10-CM | POA: Diagnosis not present

## 2019-08-29 DIAGNOSIS — R7303 Prediabetes: Secondary | ICD-10-CM | POA: Diagnosis not present

## 2019-08-29 DIAGNOSIS — I10 Essential (primary) hypertension: Secondary | ICD-10-CM | POA: Diagnosis not present

## 2019-08-29 DIAGNOSIS — Z6831 Body mass index (BMI) 31.0-31.9, adult: Secondary | ICD-10-CM | POA: Diagnosis not present

## 2019-08-29 MED ORDER — VITAMIN D (ERGOCALCIFEROL) 1.25 MG (50000 UNIT) PO CAPS: 50000.0000 [IU] | ORAL_CAPSULE | ORAL | 0 refills | Status: DC

## 2019-08-30 ENCOUNTER — Ambulatory Visit: Payer: Medicare Other | Admitting: *Deleted

## 2019-08-30 ENCOUNTER — Encounter (INDEPENDENT_AMBULATORY_CARE_PROVIDER_SITE_OTHER): Payer: Self-pay | Admitting: Bariatrics

## 2019-08-30 NOTE — Progress Notes (Signed)
Chief Complaint:   OBESITY Cam is here to discuss his progress with his obesity treatment plan along with follow-up of his obesity related diagnoses. Diangelo is on the Category 1 Plan and states he is following his eating plan approximately 100% of the time. Kaelon states he is doing 0 minutes 0 times per week.  Today's visit was #: 2 Starting weight: 223 lbs Starting date: 08/15/2019 Today's weight: 212 lbs Today's date: 08/29/2019 Total lbs lost to date: 11 Total lbs lost since last in-office visit: 11  Interim History: Joanne is down 11 lbs. He states that he is getting adequate water. He has not struggled with his plan.  Subjective:   1. Pre-diabetes Tarus has a diagnosis of pre-diabetes based on his elevated Hgb A1c at 6.1and was informed this puts him at greater risk of developing diabetes. He continues to work on diet and exercise to decrease his risk of diabetes. He denies nausea or hypoglycemia.  2. Essential hypertension Zayd's blood pressure is well controlled, and denies lightheadedness.  3. Vitamin D insufficiency Koven is taking OTC Vit D, and last Vit D level was 43.3. He denies nausea, vomiting or muscle weakness.  Assessment/Plan:   1. Pre-diabetes Antione will continue to work on weight loss, exercise, and decreasing simple carbohydrates to help decrease the risk of diabetes. He will increase his healthy protein and healthy fats.  2. Essential hypertension Ceejay continue his medications, and will continue working on healthy weight loss and exercise to improve blood pressure control. We will watch for signs of hypotension as he continues his lifestyle modifications.  3. Vitamin D insufficiency Low Vitamin D level contributes to fatigue and are associated with obesity, breast, and colon cancer. Zhaire agreed to start prescription Vitamin D 50,000 IU every week with no refills. He will follow-up for routine testing of Vitamin D, at least 2-3 times per year to avoid  over-replacement.  - Vitamin D, Ergocalciferol, (DRISDOL) 1.25 MG (50000 UNIT) CAPS capsule; Take 1 capsule (50,000 Units total) by mouth every 7 (seven) days.  Dispense: 4 capsule; Refill: 0  4. Class 1 obesity with serious comorbidity and body mass index (BMI) of 31.0 to 31.9 in adult, unspecified obesity type Cristiano is currently in the action stage of change. As such, his goal is to continue with weight loss efforts. He has agreed to the Category 1 Plan.   I discussed labs with the patient (08/15/2019-CMP, Vit D, A1c, insulin, and thyroid panel).  Exercise goals: No exercise has been prescribed at this time.  Behavioral modification strategies: increasing lean protein intake, decreasing simple carbohydrates, increasing vegetables, increasing water intake, decreasing liquid calories, decreasing eating out, no skipping meals, meal planning and cooking strategies, keeping healthy foods in the home and planning for success.  Benedict has agreed to follow-up with our clinic in 2 weeks. He was informed of the importance of frequent follow-up visits to maximize his success with intensive lifestyle modifications for his multiple health conditions.   Objective:   Blood pressure 104/62, pulse 74, temperature 98.1 F (36.7 C), height 5\' 9"  (1.753 m), weight 212 lb (96.2 kg), SpO2 97 %. Body mass index is 31.31 kg/m.  General: Cooperative, alert, well developed, in no acute distress. HEENT: Conjunctivae and lids unremarkable. Cardiovascular: Regular rhythm.  Lungs: Normal work of breathing. Neurologic: No focal deficits.   Lab Results  Component Value Date   CREATININE 1.31 (H) 08/15/2019   BUN 17 08/15/2019   NA 139 08/15/2019   K  4.2 08/15/2019   CL 100 08/15/2019   CO2 24 08/15/2019   Lab Results  Component Value Date   ALT 18 08/15/2019   AST 15 08/15/2019   ALKPHOS 49 08/15/2019   BILITOT 1.0 08/15/2019   Lab Results  Component Value Date   HGBA1C 6.1 (H) 08/15/2019   HGBA1C 6.1  11/28/2018   HGBA1C 6.4 08/23/2017   HGBA1C 6.0 02/16/2017   HGBA1C 6.0 08/11/2016   Lab Results  Component Value Date   INSULIN 10.0 08/15/2019   Lab Results  Component Value Date   TSH 1.370 08/15/2019   Lab Results  Component Value Date   CHOL 161 04/16/2019   HDL 63.80 04/16/2019   LDLCALC 70 04/16/2019   TRIG 140.0 04/16/2019   CHOLHDL 3 04/16/2019   Lab Results  Component Value Date   WBC 5.1 04/16/2019   HGB 16.4 04/16/2019   HCT 50.3 04/16/2019   MCV 86.9 04/16/2019   PLT 122.0 (L) 04/16/2019   No results found for: IRON, TIBC, FERRITIN  Obesity Behavioral Intervention Documentation for Insurance:   Approximately 15 minutes were spent on the discussion below.  ASK: We discussed the diagnosis of obesity with [name] today and Taishaun agreed to give [place] permission to discuss obesity behavioral modification therapy today.  ASSESS: Shonte has the diagnosis of obesity and his BMI today is 31.29. Kathan is in the action stage of change.   ADVISE: Newton was educated on the multiple health risks of obesity as well as the benefit of weight loss to improve his health. He was advised of the need for long term treatment and the importance of lifestyle modifications to improve his current health and to decrease his risk of future health problems.  AGREE: Multiple dietary modification options and treatment options were discussed and Adonte agreed to follow the recommendations documented in the above note.  ARRANGE: Vale was educated on the importance of frequent visits to treat obesity as outlined per CMS and USPSTF guidelines and agreed to schedule his next follow up appointment today.  Attestation Statements:   Reviewed by clinician on day of visit: allergies, medications, problem list, medical history, surgical history, family history, social history, and previous encounter notes.   Trude Mcburney, am acting as Energy manager for [employer], DO.  I have reviewed the above  documentation for accuracy and completeness, and I agree with the above. Corinna Capra, DO

## 2019-09-17 ENCOUNTER — Encounter (INDEPENDENT_AMBULATORY_CARE_PROVIDER_SITE_OTHER): Payer: Self-pay | Admitting: Family Medicine

## 2019-09-17 ENCOUNTER — Other Ambulatory Visit: Payer: Self-pay

## 2019-09-17 ENCOUNTER — Ambulatory Visit (INDEPENDENT_AMBULATORY_CARE_PROVIDER_SITE_OTHER): Payer: Medicare Other | Admitting: Family Medicine

## 2019-09-17 VITALS — BP 92/70 | HR 70 | Temp 98.0°F | Ht 69.0 in | Wt 212.0 lb

## 2019-09-17 DIAGNOSIS — E669 Obesity, unspecified: Secondary | ICD-10-CM | POA: Diagnosis not present

## 2019-09-17 DIAGNOSIS — E559 Vitamin D deficiency, unspecified: Secondary | ICD-10-CM

## 2019-09-17 DIAGNOSIS — Z6831 Body mass index (BMI) 31.0-31.9, adult: Secondary | ICD-10-CM

## 2019-09-17 MED ORDER — VITAMIN D (ERGOCALCIFEROL) 1.25 MG (50000 UNIT) PO CAPS: 50000.0000 [IU] | ORAL_CAPSULE | ORAL | 0 refills | Status: DC

## 2019-09-18 NOTE — Progress Notes (Signed)
Chief Complaint:   OBESITY [name] is here to discuss his progress with his obesity treatment plan along with follow-up of his obesity related diagnoses. [name] is on the Category 1 Plan and states he is following his eating plan approximately 90-95% of the time. [name] states he is gardening and walking for 45 minutes 3 times per week.  Today's visit was #: 3 Starting weight: 223 lbs Starting date: 08/15/2019 Today's weight: 212 lbs Today's date: 09/17/2019 Total lbs lost to date: 11 Total lbs lost since last in-office visit: 0  Interim History: [name] has done well with maintaining his weight but his weight loss has stalled. He has been trying to eat healthier but he is making more deviations from his plan.  Subjective:   1. Vitamin D deficiency [name] is stable on Vit D, but his level is not yet at goal.  Assessment/Plan:   1. Vitamin D deficiency Low Vitamin D level contributes to fatigue and are associated with obesity, breast, and colon cancer. We will refill prescription Vitamin D for 1 month. [name] will follow-up for routine testing of Vitamin D, at least 2-3 times per year to avoid over-replacement.  - Vitamin D, Ergocalciferol, (DRISDOL) 1.25 MG (50000 UNIT) CAPS capsule; Take 1 capsule (50,000 Units total) by mouth every 7 (seven) days.  Dispense: 4 capsule; Refill: 0  2. Class 1 obesity with serious comorbidity and body mass index (BMI) of 31.0 to 31.9 in adult, unspecified obesity type [name] is currently in the action stage of change. As such, his goal is to continue with weight loss efforts. He has agreed to the Category 3 Plan or keeping a food journal and adhering to recommended goals of 1400-1600 calories and 90+ grams of protein daily.   Exercise goals: As is.  Behavioral modification strategies: increasing lean protein intake and keeping a strict food journal.  [name] has agreed to follow-up with our clinic in 3 weeks. He was informed of the importance of frequent follow-up  visits to maximize his success with intensive lifestyle modifications for his multiple health conditions.   Objective:   Blood pressure 92/70, pulse 70, temperature 98 F (36.7 C), temperature source Oral, height 5\' 9"  (1.753 m), weight 212 lb (96.2 kg), SpO2 97 %. Body mass index is 31.31 kg/m.  General: Cooperative, alert, well developed, in no acute distress. HEENT: Conjunctivae and lids unremarkable. Cardiovascular: Regular rhythm.  Lungs: Normal work of breathing. Neurologic: No focal deficits.   Lab Results  Component Value Date   CREATININE 1.31 (H) 08/15/2019   BUN 17 08/15/2019   NA 139 08/15/2019   K 4.2 08/15/2019   CL 100 08/15/2019   CO2 24 08/15/2019   Lab Results  Component Value Date   ALT 18 08/15/2019   AST 15 08/15/2019   ALKPHOS 49 08/15/2019   BILITOT 1.0 08/15/2019   Lab Results  Component Value Date   HGBA1C 6.1 (H) 08/15/2019   HGBA1C 6.1 11/28/2018   HGBA1C 6.4 08/23/2017   HGBA1C 6.0 02/16/2017   HGBA1C 6.0 08/11/2016   Lab Results  Component Value Date   INSULIN 10.0 08/15/2019   Lab Results  Component Value Date   TSH 1.370 08/15/2019   Lab Results  Component Value Date   CHOL 161 04/16/2019   HDL 63.80 04/16/2019   LDLCALC 70 04/16/2019   TRIG 140.0 04/16/2019   CHOLHDL 3 04/16/2019   Lab Results  Component Value Date   WBC 5.1 04/16/2019   HGB 16.4 04/16/2019  HCT 50.3 04/16/2019   MCV 86.9 04/16/2019   PLT 122.0 (L) 04/16/2019   No results found for: IRON, TIBC, FERRITIN  Obesity Behavioral Intervention Documentation for Insurance:   Approximately 15 minutes were spent on the discussion below.  ASK: We discussed the diagnosis of obesity with [name] today and [name] agreed to give [place] permission to discuss obesity behavioral modification therapy today.  ASSESS: [name] has the diagnosis of obesity and his BMI today is 31.29. [name] is in the action stage of change.   ADVISE: [name] was educated on the multiple health risks  of obesity as well as the benefit of weight loss to improve his health. He was advised of the need for long term treatment and the importance of lifestyle modifications to improve his current health and to decrease his risk of future health problems.  AGREE: Multiple dietary modification options and treatment options were discussed and [name] agreed to follow the recommendations documented in the above note.  ARRANGE: [name] was educated on the importance of frequent visits to treat obesity as outlined per CMS and USPSTF guidelines and agreed to schedule his next follow up appointment today.  Attestation Statements:   Reviewed by clinician on day of visit: allergies, medications, problem list, medical history, surgical history, family history, social history, and previous encounter notes.   I, Trixie Dredge, am acting as transcriptionist for Dennard Nip, MD.  I have reviewed the above documentation for accuracy and completeness, and I agree with the above. -  Dennard Nip, MD

## 2019-10-11 ENCOUNTER — Encounter (INDEPENDENT_AMBULATORY_CARE_PROVIDER_SITE_OTHER): Payer: Self-pay | Admitting: Bariatrics

## 2019-10-11 ENCOUNTER — Other Ambulatory Visit: Payer: Self-pay

## 2019-10-11 ENCOUNTER — Ambulatory Visit (INDEPENDENT_AMBULATORY_CARE_PROVIDER_SITE_OTHER): Payer: Medicare Other | Admitting: Bariatrics

## 2019-10-11 VITALS — BP 104/68 | HR 70 | Temp 98.0°F | Ht 69.0 in | Wt 206.0 lb

## 2019-10-11 DIAGNOSIS — E669 Obesity, unspecified: Secondary | ICD-10-CM

## 2019-10-11 DIAGNOSIS — Z6830 Body mass index (BMI) 30.0-30.9, adult: Secondary | ICD-10-CM

## 2019-10-11 DIAGNOSIS — I10 Essential (primary) hypertension: Secondary | ICD-10-CM

## 2019-10-11 DIAGNOSIS — E7849 Other hyperlipidemia: Secondary | ICD-10-CM | POA: Diagnosis not present

## 2019-10-11 NOTE — Progress Notes (Signed)
Chief Complaint:   OBESITY [name] is here to discuss his progress with his obesity treatment plan along with follow-up of his obesity related diagnoses. [name] is keeping a food journal and adhering to recommended goals of 1400-1600 calories and 90 grams of protein and states he is following his eating plan approximately 95% of the time. [name] states he is walking 2 miles 2 times per week.  Today's visit was #: 4 Starting weight: 223 lbs Starting date: 08/15/2019 Today's weight: 206 lbs Today's date: 10/11/2019 Total lbs lost to date: 17 Total lbs lost since last in-office visit: 6  Interim History: [name] is down 6 lbs and doing well overall.  Subjective:   Essential hypertension. Shadi is taking spironolactone and Coreg. Blood pressure is well controlled.  BP Readings from Last 3 Encounters:  10/11/19 104/68  09/17/19 92/70  08/29/19 104/62   Lab Results  Component Value Date   CREATININE 1.31 (H) 08/15/2019   CREATININE 1.22 04/16/2019   CREATININE 1.21 (H) 12/22/2018   Other hyperlipidemia. [name] is taking Lipitor.   Lab Results  Component Value Date   CHOL 161 04/16/2019   HDL 63.80 04/16/2019   LDLCALC 70 04/16/2019   TRIG 140.0 04/16/2019   CHOLHDL 3 04/16/2019   Lab Results  Component Value Date   ALT 18 08/15/2019   AST 15 08/15/2019   ALKPHOS 49 08/15/2019   BILITOT 1.0 08/15/2019   The 10-year ASCVD risk score Denman George DC Jr., et al., 2013) is: 22.2%   Values used to calculate the score:     Age: 89 years     Sex: Male     Is Non-Hispanic African American: Yes     Diabetic: Yes     Tobacco smoker: No     Systolic Blood Pressure: 104 mmHg     Is BP treated: Yes     HDL Cholesterol: 63.8 mg/dL     Total Cholesterol: 161 mg/dL  Assessment/Plan:   Essential hypertension. [name] is working on healthy weight loss and exercise to improve blood pressure control. We will watch for signs of hypotension as he continues his lifestyle modifications. [name]  will continue his medications as directed.  Other hyperlipidemia. Cardiovascular risk and specific lipid/LDL goals reviewed.  We discussed several lifestyle modifications today and [name] will continue to work on diet, exercise and weight loss efforts. Orders and follow up as documented in patient record. [name] will continue Lipitor as directed.  Counseling Intensive lifestyle modifications are the first line treatment for this issue. . Dietary changes: Increase soluble fiber. Decrease simple carbohydrates. . Exercise changes: Moderate to vigorous-intensity aerobic activity 150 minutes per week if tolerated. . Lipid-lowering medications: see documented in medical record.  Class 1 obesity with serious comorbidity and body mass index (BMI) of 30.0 to 30.9 in adult, unspecified obesity type.  [name] is currently in the action stage of change. As such, his goal is to continue with weight loss efforts. He has agreed to keeping a food journal and adhering to recommended goals of 1400-1600 calories and 90+ grams of protein.   He will work on meal planning and intentional eating, and will continue to use "My Fitness Pal."  Exercise goals: Myking will continue walking in the morning for exercise.  Behavioral modification strategies: increasing lean protein intake, decreasing simple carbohydrates, increasing vegetables, increasing water intake, decreasing eating out, no skipping meals, meal planning and cooking strategies and keeping healthy foods in the home.  [name] has agreed to  follow-up with our clinic in 2 weeks. He was informed of the importance of frequent follow-up visits to maximize his success with intensive lifestyle modifications for his multiple health conditions.   Objective:   Blood pressure 104/68, pulse 70, temperature 98 F (36.7 C), height 5\' 9"  (1.753 m), weight 206 lb (93.4 kg), SpO2 97 %. Body mass index is 30.42 kg/m.  General: Cooperative, alert, well developed, in no acute  distress. HEENT: Conjunctivae and lids unremarkable. Cardiovascular: Regular rhythm.  Lungs: Normal work of breathing. Neurologic: No focal deficits.   Lab Results  Component Value Date   CREATININE 1.31 (H) 08/15/2019   BUN 17 08/15/2019   NA 139 08/15/2019   K 4.2 08/15/2019   CL 100 08/15/2019   CO2 24 08/15/2019   Lab Results  Component Value Date   ALT 18 08/15/2019   AST 15 08/15/2019   ALKPHOS 49 08/15/2019   BILITOT 1.0 08/15/2019   Lab Results  Component Value Date   HGBA1C 6.1 (H) 08/15/2019   HGBA1C 6.1 11/28/2018   HGBA1C 6.4 08/23/2017   HGBA1C 6.0 02/16/2017   HGBA1C 6.0 08/11/2016   Lab Results  Component Value Date   INSULIN 10.0 08/15/2019   Lab Results  Component Value Date   TSH 1.370 08/15/2019   Lab Results  Component Value Date   CHOL 161 04/16/2019   HDL 63.80 04/16/2019   LDLCALC 70 04/16/2019   TRIG 140.0 04/16/2019   CHOLHDL 3 04/16/2019   Lab Results  Component Value Date   WBC 5.1 04/16/2019   HGB 16.4 04/16/2019   HCT 50.3 04/16/2019   MCV 86.9 04/16/2019   PLT 122.0 (L) 04/16/2019   No results found for: IRON, TIBC, FERRITIN  Obesity Behavioral Intervention Documentation for Insurance:   Approximately 15 minutes were spent on the discussion below.  ASK: We discussed the diagnosis of obesity with 06/14/2019 today and [name] agreed to give [name] permission to discuss obesity behavioral modification therapy today.  ASSESS: [name] has the diagnosis of obesity and his BMI today is 30.5. [name] is in the action stage of change.   ADVISE: [name] was educated on the multiple health risks of obesity as well as the benefit of weight loss to improve his health. He was advised of the need for long term treatment and the importance of lifestyle modifications to improve his current health and to decrease his risk of future health problems.  AGREE: Multiple dietary modification options and treatment options were discussed and [name] agreed to follow  the recommendations documented in the above note.  ARRANGE: [name] was educated on the importance of frequent visits to treat obesity as outlined per CMS and USPSTF guidelines and agreed to schedule his next follow up appointment today.  Attestation Statements:   Reviewed by clinician on day of visit: allergies, medications, problem list, medical history, surgical history, family history, social history, and previous encounter notes.  [name], am acting as Fernanda Drum for Energy manager, DO   I have reviewed the above documentation for accuracy and completeness, and I agree with the above. [employer], DO

## 2019-10-11 NOTE — Progress Notes (Signed)
Cardiology Office Note   Date:  10/12/2019   ID:  [name] June 24, 1945, MRN [id number]  PCP:  Alan Craze, NP  Cardiologist:   Alan Pates, MD   F/U of [facility]IC[facility]     History of Present Illness: [name] is a 74 y.o. male with a history of nonischemic cardiomyopathy, HTN, OSA on CPAP), mild AS.  Cath in 2008 minimal plaquing   Ca score 248   Hx of angioedema with lisinopril   He was admitted in  Sept 2020 with resp failure and CHF   Echo LVEF 35 to 40%  Diuresed and sent home    I last saw the pt in Dec 2020  Since seen he has done [employer] is down   Dieting   He says that when he got below 220 lb his SOB went away.    He walks about 2 miles in 60 min    Denies CP   No edema      Current Meds  Medication Sig   aspirin EC 81 MG tablet Take 1 tablet (81 mg total) by mouth daily.   atorvastatin (LIPITOR) 10 MG tablet TAKE 1 TABLET BY MOUTH ONCE DAILY AT  6  00  PM   calcium-vitamin D (OSCAL WITH D) 500-200 MG-UNIT tablet Take 1 tablet by mouth.   carvedilol (COREG) 25 MG tablet Take 1 tablet by mouth twice daily   furosemide (LASIX) 40 MG tablet Take 1 tablet (40 mg total) by mouth daily.   Multiple Vitamins-Minerals (PRESERVISION AREDS 2+MULTI VIT PO) Take 1 tablet by mouth daily.   potassium chloride (KLOR-CON) 10 MEQ tablet Take 1 tablet by mouth once daily   spironolactone (ALDACTONE) 25 MG tablet Take 1/2 (one-half) tablet by mouth once daily   Vitamin D, Ergocalciferol, (DRISDOL) 1.25 MG (50000 UNIT) CAPS capsule Take 1 capsule (50,000 Units total) by mouth every 7 (seven) days.     Allergies:   Lisinopril   Past Medical History:  Diagnosis Date   Alcohol abuse    Anomalous right coronary artery    Chronic systolic CHF (congestive heart failure) (HCC) 08/18/2016   Echo 1/17:  Diff HK especially in inf wall, mild LVH, EF 30-35, mild AS (mean 10, peak 18) // Echo 11/15: Mild LVH, EF 30-35, moderate HK, Gr 1 DD, mild AI, mild RAE // Echo 8/18:  Mild LVH, EF 30-35, diffuse HK, mild aortic stenosis (mean 7), mild AI, mild BAE   Coronary artery calcification 08/18/2016   Cor CTA 1/08: Ca score 248 // LHC 1/08: oLAD 25%   History of ETOH abuse    Hyperlipidemia 08/27/2013   Hypertension    Lactose intolerance    Non-ischemic cardiomyopathy (HCC)    Presumed secondary to Etoh and HTN  (Prev EF 20-25% -- improved to 50%) // LHC 1/08: oLAD 25, EF 25%    OSA (obstructive sleep apnea) 10/17/2013   Severe per home sleep study performed on 09/26/13.    Sleep apnea    SOB (shortness of breath)    Vitamin D deficiency     History reviewed. No pertinent surgical history.   Social History:  The patient  reports that he quit smoking about 17 years ago. He quit after 20.00 years of use. He has never used smokeless tobacco. He reports current alcohol use of about 7.0 standard drinks of alcohol per week. He reports that he does not use drugs.   Family History:  The patient's family  history includes Alcohol abuse in his father and mother; Heart disease in his maternal grandmother and mother; Heart failure in his father; High Cholesterol in his mother; High blood pressure in his mother; Stroke in his maternal grandmother; Sudden death in his mother.    ROS:  Please see the history of present illness. All other systems are reviewed and  Negative to the above problem except as noted.    PHYSICAL EXAM: VS:  BP 110/76    Pulse 68    Ht 5\' 10"  (1.778 m) Comment: per pt   Wt 210 lb (95.3 kg)    SpO2 98%    BMI 30.13 kg/m   GEN: Well nourished, well developed, in no acute distress  HEENT: normal  Neck: JVP normla  No carotid bruits Cardiac: RRR; no murmurs No LE  edema  Respiratory:  clear to auscultation bilaterally, normal work of breathing GI: soft, nontender, nondistended, + BS  No hepatomegaly  MS: no deformity Moving all extremities   Skin: warm and dry, no rash Neuro:  Strength and sensation are intact Psych: euthymic mood, full  affect   EKG:  EKG is not ordered today.     Lipid Panel    Component Value Date/Time   CHOL 161 04/16/2019 0738   TRIG 140.0 04/16/2019 0738   HDL 63.80 04/16/2019 0738   CHOLHDL 3 04/16/2019 0738   VLDL 28.0 04/16/2019 0738   LDLCALC 70 04/16/2019 0738      Wt Readings from Last 3 Encounters:  10/12/19 210 lb (95.3 kg)  10/11/19 206 lb (93.4 kg)  09/17/19 212 lb (96.2 kg)      ASSESSMENT AND PLAN:  1  Chronic systolic CHF Volume status remains good  He is Class I NYHA heart failure  I would keep on current regimen, not push further with BP    2  Lipids   Last LDL 70, HDL 64 (Jan 2021)  3  HTN  BP is good on current regimen     4  CAD   No symtpoms of angina  5  Weight loss   Congratulated him on loss   Encouraged him to stay active.    F/U next spring       Current medicines are reviewed at length with the patient today.  The patient does not have concerns regarding medicines.  Signed, [birth year]-01-1989, MD  10/12/2019 9:06 AM    [facility] [address] [place], [place], [place]  [place] Phone: [phone]; Fax: [phone]

## 2019-10-12 ENCOUNTER — Ambulatory Visit (INDEPENDENT_AMBULATORY_CARE_PROVIDER_SITE_OTHER): Payer: Medicare Other | Admitting: Internal Medicine

## 2019-10-12 ENCOUNTER — Encounter: Payer: Self-pay | Admitting: Internal Medicine

## 2019-10-12 VITALS — BP 110/76 | HR 68 | Ht 70.0 in | Wt 210.0 lb

## 2019-10-12 DIAGNOSIS — I428 Other cardiomyopathies: Secondary | ICD-10-CM | POA: Diagnosis not present

## 2019-10-12 DIAGNOSIS — I10 Essential (primary) hypertension: Secondary | ICD-10-CM

## 2019-10-12 DIAGNOSIS — E782 Mixed hyperlipidemia: Secondary | ICD-10-CM

## 2019-10-12 DIAGNOSIS — I2510 Atherosclerotic heart disease of native coronary artery without angina pectoris: Secondary | ICD-10-CM | POA: Diagnosis not present

## 2019-10-12 MED ORDER — FUROSEMIDE 40 MG PO TABS: 40.0000 mg | ORAL_TABLET | Freq: Every day | ORAL | 3 refills | Status: DC

## 2019-10-12 NOTE — Patient Instructions (Signed)
Medication Instructions:  The current medical regimen is effective;  continue present plan and medications.  *If you need a refill on your cardiac medications before your next appointment, please call your pharmacy*  Follow-Up: At [facility], you and your health needs are our priority.  As part of our continuing mission to provide you with exceptional heart care, we have created designated Provider Care Teams.  These Care Teams include your primary Cardiologist (physician) and Advanced Practice Providers (APPs -  Physician Assistants and Nurse Practitioners) who all work together to provide you with the care you need, when you need it.  We recommend signing up for the patient portal called "MyChart".  Sign up information is provided on this After Visit Summary.  MyChart is used to connect with patients for Virtual Visits (Telemedicine).  Patients are able to view lab/test results, encounter notes, upcoming appointments, etc.  Non-urgent messages can be sent to your provider as well.   To learn more about what you can do with MyChart, go to [email or web address].    Your next appointment:   6 month(s)  The format for your next appointment:   In Person  Provider:   Paula Ross, MD   Thank you for choosing [facility]!!      

## 2019-10-24 ENCOUNTER — Ambulatory Visit (INDEPENDENT_AMBULATORY_CARE_PROVIDER_SITE_OTHER): Payer: Medicare Other | Admitting: *Deleted

## 2019-10-24 ENCOUNTER — Other Ambulatory Visit: Payer: Self-pay

## 2019-10-24 ENCOUNTER — Encounter: Payer: Self-pay | Admitting: *Deleted

## 2019-10-24 VITALS — BP 106/74 | HR 54 | Wt 211.0 lb

## 2019-10-24 DIAGNOSIS — Z0000 Encounter for general adult medical examination without abnormal findings: Secondary | ICD-10-CM | POA: Diagnosis not present

## 2019-10-24 NOTE — Patient Instructions (Signed)
Please schedule your next medicare wellness visit with me in 1 yr.  Continue to eat heart healthy diet (full of fruits, vegetables, whole grains, lean protein, water--limit salt, fat, and sugar intake) and increase physical activity as tolerated.  Continue doing brain stimulating activities (puzzles, reading, adult coloring books, staying active) to keep memory sharp.    [name] , Thank you for taking time to come for your Medicare Wellness Visit. I appreciate your ongoing commitment to your health goals. Please review the following plan we discussed and let me know if I can assist you in the future.   These are the goals we discussed: Goals    . Eat more fruits and vegetables    . Maintain current health.    . Weight (lb) < 210 lb (95.3 kg)       This is a list of the screening recommended for you and due dates:  Health Maintenance  Topic Date Due  . Eye exam for diabetics  Never done  . Urine Protein Check  Never done  . Tetanus Vaccine  01/28/2018  . Flu Shot  11/11/2019  . Complete foot exam   11/28/2019  . Hemoglobin A1C  02/15/2020  . Colon Cancer Screening  09/20/2027  . COVID-19 Vaccine  Completed  .  Hepatitis C: One time screening is recommended by Center for Disease Control  (CDC) for  adults born from [birth year] through 1964.   Completed  . Pneumonia vaccines  Completed    Preventive Care 75 Years and Older, Male Preventive care refers to lifestyle choices and visits with your health care provider that can promote health and wellness. This includes:  A yearly physical exam. This is also called an annual well check.  Regular dental and eye exams.  Immunizations.  Screening for certain conditions.  Healthy lifestyle choices, such as diet and exercise. What can I expect for my preventive care visit? Physical exam Your health care provider will check:  Height and weight. These may be used to calculate body mass index (BMI), which is a measurement that tells if you  are at a healthy weight.  Heart rate and blood pressure.  Your skin for abnormal spots. Counseling Your health care provider may ask you questions about:  Alcohol, tobacco, and drug use.  Emotional well-being.  Home and relationship well-being.  Sexual activity.  Eating habits.  History of falls.  Memory and ability to understand (cognition).  Work and work Statistician. What immunizations do I need?  Influenza (flu) vaccine  This is recommended every year. Tetanus, diphtheria, and pertussis (Tdap) vaccine  You may need a Td booster every 10 years. Varicella (chickenpox) vaccine  You may need this vaccine if you have not already been vaccinated. Zoster (shingles) vaccine  You may need this after age 32. Pneumococcal conjugate (PCV13) vaccine  One dose is recommended after age 27. Pneumococcal polysaccharide (PPSV23) vaccine  One dose is recommended after age 46. Measles, mumps, and rubella (MMR) vaccine  You may need at least one dose of MMR if you were born in 1957 or later. You may also need a second dose. Meningococcal conjugate (MenACWY) vaccine  You may need this if you have certain conditions. Hepatitis A vaccine  You may need this if you have certain conditions or if you travel or work in places where you may be exposed to hepatitis A. Hepatitis B vaccine  You may need this if you have certain conditions or if you travel or work in places  where you may be exposed to hepatitis B. Haemophilus influenzae type b (Hib) vaccine  You may need this if you have certain conditions. You may receive vaccines as individual doses or as more than one vaccine together in one shot (combination vaccines). Talk with your health care provider about the risks and benefits of combination vaccines. What tests do I need? Blood tests  Lipid and cholesterol levels. These may be checked every 5 years, or more frequently depending on your overall health.  Hepatitis C  test.  Hepatitis B test. Screening  Lung cancer screening. You may have this screening every year starting at age 50 if you have a 30-pack-year history of smoking and currently smoke or have quit within the past 15 years.  Colorectal cancer screening. All adults should have this screening starting at age 40 and continuing until age 54. Your health care provider may recommend screening at age 72 if you are at increased risk. You will have tests every 1-10 years, depending on your results and the type of screening test.  Prostate cancer screening. Recommendations will vary depending on your family history and other risks.  Diabetes screening. This is done by checking your blood sugar (glucose) after you have not eaten for a while (fasting). You may have this done every 1-3 years.  Abdominal aortic aneurysm (AAA) screening. You may need this if you are a current or former smoker.  Sexually transmitted disease (STD) testing. Follow these instructions at home: Eating and drinking  Eat a diet that includes fresh fruits and vegetables, whole grains, lean protein, and low-fat dairy products. Limit your intake of foods with high amounts of sugar, saturated fats, and salt.  Take vitamin and mineral supplements as recommended by your health care provider.  Do not drink alcohol if your health care provider tells you not to drink.  If you drink alcohol: ? Limit how much you have to 0-2 drinks a day. ? Be aware of how much alcohol is in your drink. In the [place] one drink equals one 12 oz bottle of beer (355 mL), one 5 oz glass of wine (148 mL), or one 1 oz glass of hard liquor (44 mL). Lifestyle  Take daily care of your teeth and gums.  Stay active. Exercise for at least 30 minutes on 5 or more days each week.  Do not use any products that contain nicotine or tobacco, such as cigarettes, e-cigarettes, and chewing tobacco. If you need help quitting, ask your health care provider.  If you are  sexually active, practice safe sex. Use a condom or other form of protection to prevent STIs (sexually transmitted infections).  Talk with your health care provider about taking a low-dose aspirin or statin. What's next?  Visit your health care provider once a year for a well check visit.  Ask your health care provider how often you should have your eyes and teeth checked.  Stay up to date on all vaccines. This information is not intended to replace advice given to you by your health care provider. Make sure you discuss any questions you have with your health care provider. Document Revised: 03/23/2018 Document Reviewed: 03/23/2018 Elsevier Patient Education  2020 [employer].

## 2019-10-24 NOTE — Progress Notes (Signed)
I connected with [place] today by telephone and verified that I am speaking with the correct person using two identifiers. Location patient: home Location provider: work Persons participating in the virtual visit: patient, Engineer, civil (consulting).    I discussed the limitations, risks, security and privacy concerns of performing an evaluation and management service by telephone and the availability of in person appointments. I also discussed with the patient that there may be a patient responsible charge related to this service. The patient expressed understanding and verbally consented to this telephonic visit.    Interactive audio and video telecommunications were attempted between this provider and patient, however failed, due to patient having technical difficulties OR patient did not have access to video capability.  We continued and completed visit with audio only.  Some vital signs may be absent or patient reported.    Subjective:   [name] is a 74 y.o. male who presents for Medicare Annual/Subsequent preventive examination.  Review of Systems    Cardiac Risk Factors include: advanced age (>63men, >93 women);dyslipidemia;male gender     Objective:    Today's Vitals   10/24/19 1104  BP: 106/74  Pulse: (!) 54  Weight: 211 lb (95.7 kg)   Body mass index is 30.28 kg/m.  Advanced Directives 10/24/2019 12/15/2018 08/29/2018 08/23/2017 08/11/2016 04/25/2016 06/20/2015  Does Patient Have a Medical Advance Directive? No No No No No No No  Does patient want to make changes to medical advance directive? - - - - Yes (MAU/Ambulatory/Procedural Areas - Information given) - -  Would patient like information on creating a medical advance directive? No - Patient declined No - Patient declined No - Patient declined No - Patient declined - - No - patient declined information    Current Medications (verified) Outpatient Encounter Medications as of 10/24/2019  Medication Sig  . aspirin EC 81 MG tablet Take 1  tablet (81 mg total) by mouth daily.  Marland Kitchen atorvastatin (LIPITOR) 10 MG tablet TAKE 1 TABLET BY MOUTH ONCE DAILY AT  6  00  PM  . carvedilol (COREG) 25 MG tablet Take 1 tablet by mouth twice daily  . furosemide (LASIX) 40 MG tablet Take 1 tablet (40 mg total) by mouth daily.  . Multiple Vitamins-Minerals (PRESERVISION AREDS 2+MULTI VIT PO) Take 1 tablet by mouth daily.  . potassium chloride (KLOR-CON) 10 MEQ tablet Take 1 tablet by mouth once daily  . spironolactone (ALDACTONE) 25 MG tablet Take 1/2 (one-half) tablet by mouth once daily  . Vitamin D, Ergocalciferol, (DRISDOL) 1.25 MG (50000 UNIT) CAPS capsule Take 1 capsule (50,000 Units total) by mouth every 7 (seven) days.  . calcium-vitamin D (OSCAL WITH D) 500-200 MG-UNIT tablet Take 1 tablet by mouth. (Patient not taking: Reported on 10/24/2019)   No facility-administered encounter medications on file as of 10/24/2019.    Allergies (verified) Lisinopril   History: Past Medical History:  Diagnosis Date  . Alcohol abuse   . Anomalous right coronary artery   . Chronic systolic CHF (congestive heart failure) (HCC) 08/18/2016   Echo 1/17:  Diff HK especially in inf wall, mild LVH, EF 30-35, mild AS (mean 10, peak 18) // Echo 11/15: Mild LVH, EF 30-35, moderate HK, Gr 1 DD, mild AI, mild RAE // Echo 8/18: Mild LVH, EF 30-35, diffuse HK, mild aortic stenosis (mean 7), mild AI, mild BAE  . Coronary artery calcification 08/18/2016   Cor CTA 1/08: Ca score 248 // LHC 1/08: oLAD 25%  . History of ETOH abuse   .  Hyperlipidemia 08/27/2013  . Hypertension   . Lactose intolerance   . Non-ischemic cardiomyopathy (HCC)    Presumed secondary to Etoh and HTN  (Prev EF 20-25% -- improved to 50%) // LHC 1/08: oLAD 25, EF 25%   . OSA (obstructive sleep apnea) 10/17/2013   Severe per home sleep study performed on 09/26/13.   . Sleep apnea   . SOB (shortness of breath)   . Vitamin D deficiency    History reviewed. No pertinent surgical history. Family  History  Problem Relation Age of Onset  . Alcohol abuse Mother   . Heart disease Mother   . High blood pressure Mother   . High Cholesterol Mother   . Sudden death Mother   . Alcohol abuse Father   . Heart failure Father   . Heart disease Maternal Grandmother   . Stroke Maternal Grandmother   . Diabetes Neg Hx    Social History   Socioeconomic History  . Marital status: Married    Spouse name: [name]  . Number of children: 3  . Years of education: Not on file  . Highest education level: Not on file  Occupational History  . Occupation: Retired Radiographer, therapeutic  Tobacco Use  . Smoking status: Former Smoker    Years: 20.00    Quit date: 04/12/2002    Years since quitting: 17.5  . Smokeless tobacco: Never Used  Vaping Use  . Vaping Use: Never used  Substance and Sexual Activity  . Alcohol use: Not Currently    Alcohol/week: 0.0 standard drinks  . Drug use: No    Comment: history of marijuana use  . Sexual activity: Yes  Other Topics Concern  . Not on file  Social History Narrative   2 sons and daughter   1 son in [name]. Living with wife 42yrs [name]).  Use to live near [place].   Social Determinants of Health   Financial Resource Strain: Low Risk   . Difficulty of Paying Living Expenses: Not hard at all  Food Insecurity: No Food Insecurity  . Worried About Programme researcher, broadcasting/film/video in the Last Year: Never true  . Ran Out of Food in the Last Year: Never true  Transportation Needs: No Transportation Needs  . Lack of Transportation (Medical): No  . Lack of Transportation (Non-Medical): No  Physical Activity:   . Days of Exercise per Week:   . Minutes of Exercise per Session:   Stress:   . Feeling of Stress :   Social Connections:   . Frequency of Communication with Friends and Family:   . Frequency of Social Gatherings with Friends and Family:   . Attends Religious Services:   . Active Member of Clubs or Organizations:   . Attends Banker Meetings:   Marland Kitchen Marital  Status:     Tobacco Counseling Counseling given: Not Answered   Clinical Intake: Pain : No/denies pain   Activities of Daily Living In your present state of health, do you have any difficulty performing the following activities: 10/24/2019 12/15/2018  Hearing? N N  Vision? N N  Difficulty concentrating or making decisions? N N  Walking or climbing stairs? N N  Dressing or bathing? N N  Doing errands, shopping? N N  Preparing Food and eating ? N -  Using the Toilet? N -  In the past six months, have you accidently leaked urine? N -  Do you have problems with loss of bowel control? N -  Managing your Medications? N -  Managing your Finances? N -  Housekeeping or managing your Housekeeping? N -  Some recent data might be hidden    Patient Care Team: Sandford Craze, NP as PCP - General (Internal Medicine) Pricilla Riffle, MD as PCP - Cardiology (Cardiology) Barron Alvine, MD as Consulting Physician (Urology)  Indicate any recent Medical Services you may have received from other than [facility] providers in the past year (date may be approximate).     Assessment:   This is a routine wellness examination for [facility].  Dietary issues and exercise activities discussed: Current Exercise Habits: Home exercise routine, Type of exercise: walking, Time (Minutes): 60, Frequency (Times/Week): 7, Weekly Exercise (Minutes/Week): 420, Intensity: Mild, Exercise limited by: None identified Diet (meal preparation, eat out, water intake, caffeinated beverages, dairy products, fruits and vegetables): in general, a "healthy" diet  , well balanced   Goals    . Eat more fruits and vegetables    . Maintain current health.    . Weight (lb) < 210 lb (95.3 kg)      Depression Screen PHQ 2/9 Scores 08/15/2019 08/29/2018 08/23/2017 08/11/2016 05/05/2016 02/02/2016 06/20/2015  PHQ - 2 Score 2 0 0 0 0 0 0  PHQ- 9 Score 4 - - - - - -  Exception Documentation Medical reason - - - - - -    Fall Risk Fall Risk   10/24/2019 08/29/2018 08/23/2017 08/23/2017 08/11/2016  Falls in the past year? 0 0 No No No  Number falls in past yr: 0 - - - -  Injury with Fall? 0 - - - -  Risk for fall due to : - - Other (Comment) - -  Follow up Education provided;Falls prevention discussed - - - -   Lives w/ wife in 3 story home.  Any stairs in or around the home? Yes  If so, are there any without handrails? No  Home free of loose throw rugs in walkways, pet beds, electrical cords, etc? Yes  Adequate lighting in your home to reduce risk of falls? Yes    Cognitive Function: Ad8 score reviewed for issues:  Issues making decisions:no  Less interest in hobbies / activities:no  Repeats questions, stories (family complaining):no  Trouble using ordinary gadgets (microwave, computer, phone):no  Forgets the month or year: no  Mismanaging finances: no  Remembering appts:no  Daily problems with thinking and/or memory:no Ad8 score is=0     MMSE - Mini Mental State Exam 06/20/2015  Orientation to time 5  Orientation to Place 5  Registration 3  Attention/ Calculation 5  Recall 3  Language- name 2 objects 2  Language- repeat 1  Language- follow 3 step command 3  Language- read & follow direction 1  Write a sentence 1  Copy design 1  Total score 30        Immunizations Immunization History  Administered Date(s) Administered  . Fluad Quad(high Dose 65+) 12/20/2018  . Influenza Split 02/23/2011, 01/13/2012  . Influenza Whole 01/19/2008, 01/20/2009, 12/18/2009  . Influenza, High Dose Seasonal PF 02/02/2016, 02/02/2017, 12/11/2017  . Influenza,inj,Quad PF,6+ Mos 01/03/2013, 01/21/2014, 02/05/2015  . PFIZER SARS-COV-2 Vaccination 05/25/2019, 06/17/2019  . Pneumococcal Conjugate-13 01/21/2014, 02/05/2015  . Pneumococcal Polysaccharide-23 01/19/2008, 02/02/2016  . Td 01/29/2008  . Zoster 08/24/2011  . Zoster Recombinat (Shingrix) 12/07/2018, 02/13/2019     Flu Vaccine status: Up to date Pneumococcal  vaccine status: Up to date Covid-19 vaccine status: Completed vaccines  Qualifies for Shingles Vaccine? Yes   Zostavax completed Yes   Shingrix Completed?:  Yes  Screening Tests Health Maintenance  Topic Date Due  . OPHTHALMOLOGY EXAM  Never done  . URINE MICROALBUMIN  Never done  . TETANUS/TDAP  01/28/2018  . INFLUENZA VACCINE  11/11/2019  . FOOT EXAM  11/28/2019  . HEMOGLOBIN A1C  02/15/2020  . COLONOSCOPY  09/20/2027  . COVID-19 Vaccine  Completed  . Hepatitis C Screening  Completed  . PNA vac Low Risk Adult  Completed    Health Maintenance  Health Maintenance Due  Topic Date Due  . OPHTHALMOLOGY EXAM  Never done  . URINE MICROALBUMIN  Never done  . TETANUS/TDAP  01/28/2018    Colorectal cancer screening: Completed 09/19/17-Cologuard. Repeat every 3 years  Lung Cancer Screening: (Low Dose CT Chest recommended if Age 31-80 years, 30 pack-year currently smoking OR have quit w/in 15years.) does not qualify.   Additional Screening:  Hepatitis C Screening: does qualify; Completed 06/20/15  Vision Screening: Recommended annual ophthalmology exams for early detection of glaucoma and other disorders of the eye.  Dental Screening: Recommended annual dental exams for proper oral hygiene  Community Resource Referral / Chronic Care Management: CRR required this visit?  No   CCM required this visit?  No      Plan:     Please schedule your next medicare wellness visit with me in 1 yr.  Continue to eat heart healthy diet (full of fruits, vegetables, whole grains, lean protein, water--limit salt, fat, and sugar intake) and increase physical activity as tolerated.  Continue doing brain stimulating activities (puzzles, reading, adult coloring books, staying active) to keep memory sharp.     I have personally reviewed and noted the following in the patient's chart:   . Medical and social history . Use of alcohol, tobacco or illicit drugs  . Current medications and  supplements . Functional ability and status . Nutritional status . Physical activity . Advanced directives . List of other physicians . Hospitalizations, surgeries, and ER visits in previous 12 months . Vitals . Screenings to include cognitive, depression, and falls . Referrals and appointments  In addition, I have reviewed and discussed with patient certain preventive protocols, quality metrics, and best practice recommendations. A written personalized care plan for preventive services as well as general preventive health recommendations were provided to patient.   Due to this being a telephonic visit, the after visit summary with patients personalized plan was offered to patient via mail or my-chart.  Patient would like to access on my-chart.   Mady Haagensen [place], [place]   10/24/2019

## 2019-10-25 ENCOUNTER — Other Ambulatory Visit: Payer: Self-pay

## 2019-10-25 ENCOUNTER — Ambulatory Visit (INDEPENDENT_AMBULATORY_CARE_PROVIDER_SITE_OTHER): Payer: Medicare Other | Admitting: Adult Health

## 2019-10-25 ENCOUNTER — Encounter (INDEPENDENT_AMBULATORY_CARE_PROVIDER_SITE_OTHER): Payer: Self-pay | Admitting: Adult Health

## 2019-10-25 VITALS — BP 91/56 | HR 66 | Temp 98.0°F | Ht 69.0 in | Wt 202.0 lb

## 2019-10-25 DIAGNOSIS — I10 Essential (primary) hypertension: Secondary | ICD-10-CM | POA: Diagnosis not present

## 2019-10-25 DIAGNOSIS — Z6830 Body mass index (BMI) 30.0-30.9, adult: Secondary | ICD-10-CM | POA: Diagnosis not present

## 2019-10-25 DIAGNOSIS — I2510 Atherosclerotic heart disease of native coronary artery without angina pectoris: Secondary | ICD-10-CM

## 2019-10-25 DIAGNOSIS — E663 Overweight: Secondary | ICD-10-CM | POA: Insufficient documentation

## 2019-10-25 DIAGNOSIS — E669 Obesity, unspecified: Secondary | ICD-10-CM | POA: Diagnosis not present

## 2019-10-25 DIAGNOSIS — I5022 Chronic systolic (congestive) heart failure: Secondary | ICD-10-CM

## 2019-10-25 DIAGNOSIS — E559 Vitamin D deficiency, unspecified: Secondary | ICD-10-CM | POA: Diagnosis not present

## 2019-10-25 MED ORDER — VITAMIN D (ERGOCALCIFEROL) 1.25 MG (50000 UNIT) PO CAPS: 50000.0000 [IU] | ORAL_CAPSULE | ORAL | 0 refills | Status: DC

## 2019-10-25 NOTE — Progress Notes (Signed)
Chief Complaint:   OBESITY [name] is here to discuss his progress with his obesity treatment plan along with follow-up of his obesity related diagnoses. [name] is keeping a food journal and adhering to recommended goals of 1400-1600 calories and 90+ grams of protein and states he is following his eating plan approximately 100% of the time. [name] states he is walking 60 minutes 7 times per week.  Today's visit was #: 5 Starting weight: 223 lbs Starting date: 08/15/2019 Today's weight: 202 lbs Today's date: 10/25/2019 Total lbs lost to date: 21 Total lbs lost since last in-office visit: 4  Interim History: [name] is tracking his intake 100% and estimates to hit his protein/calorie goal 100% of the time. He denies polyphagia or excessive cravings while following the journaling plan. He and his wife will be planting an organic garden soon - tomatoes, peppers, etc.  Subjective:   Vitamin D deficiency. [name] is on prescription strength Vitamin D supplementation and denies nausea, vomiting, or muscle weakness.    Ref. Range 08/15/2019 11:50  Vitamin D, 25-Hydroxy Latest Ref Range: 30.0 - 100.0 ng/mL 43.3   Essential hypertension. Blood pressure is low today, however, he denies symptoms of hypotension. His cardiologist recently decreased his carvedilol to 25 mg daily from BID.The following has remained the same- Furosemide 40 mg daily, Potassium Chloride 10 mEq daily, and spironolactone 25 mg 1/2 tab daily.  BP Readings from Last 3 Encounters:  10/25/19 (!) 91/56  10/24/19 106/74  10/12/19 110/76   Lab Results  Component Value Date   CREATININE 1.31 (H) 08/15/2019   CREATININE 1.22 04/16/2019   CREATININE 1.21 (H) 12/22/2018   Assessment/Plan:   Vitamin D deficiency. Low Vitamin D level contributes to fatigue and are associated with obesity, breast, and colon cancer. He was given a refill on his Vitamin D, Ergocalciferol, (DRISDOL) 1.25 MG (50000 UNIT) CAPS capsule every week #4  with 0 refills and will follow-up for routine testing of Vitamin D every 3 months.   Essential hypertension. [name] is working on healthy weight loss and exercise to improve blood pressure control. We will watch for signs of hypotension as he continues his lifestyle modifications. [name] will remain well hydrated. He will continue his medication regimen per Cardiology with close monitoring of his blood pressure. He is to call his cardiologist if systolic blood pressure is less than 100 or if symptoms of hypotension develop.  Class 1 obesity with serious comorbidity and body mass index (BMI) of 30.0 to 30.9 in adult, unspecified obesity type - BMI was above 30 when started program.  [name] is currently in the action stage of change. As such, his goal is to continue with weight loss efforts. He has agreed to keeping a food journal and adhering to recommended goals of 1400-1600 calories and 90+ grams of protein.   Exercise goals: [name] will continue his current exercise regimen.   Behavioral modification strategies: increasing lean protein intake, meal planning and cooking strategies and planning for success.  [name] has agreed to follow-up with our clinic in 2 weeks. He was informed of the importance of frequent follow-up visits to maximize his success with intensive lifestyle modifications for his multiple health conditions.   Objective:   Blood pressure (!) 91/56, pulse 66, temperature 98 F (36.7 C), temperature source Oral, height 5\' 9"  (1.753 m), weight 202 lb (91.6 kg), SpO2 98 %. Body mass index is 29.83 kg/m.  General: Cooperative, alert, well developed, in no acute distress. HEENT: Conjunctivae  and lids unremarkable. Cardiovascular: Regular rhythm.  Lungs: Normal work of breathing. Neurologic: No focal deficits.   Lab Results  Component Value Date   CREATININE 1.31 (H) 08/15/2019   BUN 17 08/15/2019   NA 139 08/15/2019   K 4.2 08/15/2019   CL 100 08/15/2019   CO2 24 08/15/2019   Lab  Results  Component Value Date   ALT 18 08/15/2019   AST 15 08/15/2019   ALKPHOS 49 08/15/2019   BILITOT 1.0 08/15/2019   Lab Results  Component Value Date   HGBA1C 6.1 (H) 08/15/2019   HGBA1C 6.1 11/28/2018   HGBA1C 6.4 08/23/2017   HGBA1C 6.0 02/16/2017   HGBA1C 6.0 08/11/2016   Lab Results  Component Value Date   INSULIN 10.0 08/15/2019   Lab Results  Component Value Date   TSH 1.370 08/15/2019   Lab Results  Component Value Date   CHOL 161 04/16/2019   HDL 63.80 04/16/2019   LDLCALC 70 04/16/2019   TRIG 140.0 04/16/2019   CHOLHDL 3 04/16/2019   Lab Results  Component Value Date   WBC 5.1 04/16/2019   HGB 16.4 04/16/2019   HCT 50.3 04/16/2019   MCV 86.9 04/16/2019   PLT 122.0 (L) 04/16/2019   No results found for: IRON, TIBC, FERRITIN  Obesity Behavioral Intervention Documentation for Insurance:   Approximately 15 minutes were spent on the discussion below.  ASK: We discussed the diagnosis of obesity with [name] today and [name] agreed to give [place] permission to discuss obesity behavioral modification therapy today.  ASSESS: [name] has the diagnosis of obesity and his BMI today is 29.9. [name] is in the action stage of change.   ADVISE: [name] was educated on the multiple health risks of obesity as well as the benefit of weight loss to improve his health. He was advised of the need for long term treatment and the importance of lifestyle modifications to improve his current health and to decrease his risk of future health problems.  AGREE: Multiple dietary modification options and treatment options were discussed and [name] agreed to follow the recommendations documented in the above note.  ARRANGE: [name] was educated on the importance of frequent visits to treat obesity as outlined per CMS and USPSTF guidelines and agreed to schedule his next follow up appointment today.  Attestation Statements:   Reviewed by clinician on day of visit: allergies, medications, problem  list, medical history, surgical history, family history, social history, and previous encounter notes.  I, Marianna Payment, am acting as Energy manager for [employer], NP-C   I have reviewed the above documentation for accuracy and completeness, and I agree with the above. -  Julaine Fusi, NP

## 2019-10-26 NOTE — Telephone Encounter (Signed)
Hold PM coreg Cut back to 1/2 tab bid  May need to back off further   Needs labs   Can he come in for BMET and BNP and CBC today??  Run stat

## 2019-10-30 ENCOUNTER — Telehealth: Payer: Self-pay | Admitting: Internal Medicine

## 2019-10-30 MED ORDER — CARVEDILOL 12.5 MG PO TABS: 12.5000 mg | ORAL_TABLET | Freq: Two times a day (BID) | ORAL | 3 refills | Status: DC

## 2019-10-30 NOTE — Telephone Encounter (Signed)
Patient is returning call to discuss Carvedilol decrease (see patient message). Patient states he has additional questions for Dr. Tenny Craw.

## 2019-10-30 NOTE — Telephone Encounter (Signed)
I called and left detailed message on self identified VM. Sent same info to him via MyChart.  Wanted to confirm he is aware to decrease carvedilol to 12.5 mg twice a day and that Dr. Tenny Craw wants him to come in for labs asap.  Awaiting his reply.

## 2019-10-30 NOTE — Telephone Encounter (Signed)
Reached patient on home number.  He is aware to decrease carvedilol to 12.5 mg bid and will come tomorrow am for blood work.

## 2019-10-31 ENCOUNTER — Other Ambulatory Visit: Payer: Medicare Other | Admitting: *Deleted

## 2019-10-31 ENCOUNTER — Other Ambulatory Visit: Payer: Self-pay

## 2019-10-31 DIAGNOSIS — I2510 Atherosclerotic heart disease of native coronary artery without angina pectoris: Secondary | ICD-10-CM

## 2019-10-31 DIAGNOSIS — I5022 Chronic systolic (congestive) heart failure: Secondary | ICD-10-CM

## 2019-10-31 LAB — PRO B NATRIURETIC PEPTIDE: NT-Pro BNP: 1336 pg/mL — ABNORMAL HIGH (ref 0–376)

## 2019-10-31 LAB — BASIC METABOLIC PANEL
BUN/Creatinine Ratio: 16 (ref 10–24)
BUN: 20 mg/dL (ref 8–27)
CO2: 24 mmol/L (ref 20–29)
Calcium: 9.5 mg/dL (ref 8.6–10.2)
Chloride: 101 mmol/L (ref 96–106)
Creatinine, Ser: 1.24 mg/dL (ref 0.76–1.27)
GFR calc Af Amer: 67 mL/min/{1.73_m2} (ref >59)
GFR calc non Af Amer: 58 mL/min/{1.73_m2} — ABNORMAL LOW (ref >59)
Glucose: 88 mg/dL (ref 65–99)
Potassium: 4.3 mmol/L (ref 3.5–5.2)
Sodium: 139 mmol/L (ref 134–144)

## 2019-10-31 LAB — CBC
Hematocrit: 48.5 % (ref 37.5–51.0)
Hemoglobin: 15.9 g/dL (ref 13.0–17.7)
MCH: 27.7 pg (ref 26.6–33.0)
MCHC: 32.8 g/dL (ref 31.5–35.7)
MCV: 84 fL (ref 79–97)
Platelets: 132 10*3/uL — ABNORMAL LOW (ref 150–450)
RBC: 5.75 x10E6/uL (ref 4.14–5.80)
RDW: 14.4 % (ref 11.6–15.4)
WBC: 4.7 10*3/uL (ref 3.4–10.8)

## 2019-11-02 ENCOUNTER — Telehealth: Payer: Self-pay | Admitting: *Deleted

## 2019-11-02 MED ORDER — FUROSEMIDE 40 MG PO TABS
ORAL_TABLET | ORAL | 3 refills | Status: DC
Start: 2019-11-02 — End: 2020-04-21

## 2019-11-02 NOTE — Telephone Encounter (Signed)
Patient has been informed of results and recommendations. Discussed that he and his wife are following plan from weight management.  He drinks about 64 oz water a day and is feeling better w exercise on the decreased dose of carvedilol.  BP has improved to about 106/64, HR 58.    Will take lasix 60 mg twice a week and 40 mg on all other days.

## 2019-11-02 NOTE — Telephone Encounter (Signed)
-----   Message from Pricilla Riffle, MD sent at 11/01/2019  1:19 PM EDT ----- Labs show electrolytea are OK   Kidney function is stable  Cr 1.24 His fluid overall is increased Recom:  Can take lasix 1 1/2 lasix 2x per week forextra fluid Make sure limiting salt to 2-2.5 gram sodium per day   Watch fluids  How is his BP  ?   HOw is he feeling?

## 2019-11-04 NOTE — Telephone Encounter (Signed)
Recomm:   1.5 L fluid max   Dont push fluids just to push fluids    Chasing it with lasix

## 2019-11-05 NOTE — Telephone Encounter (Signed)
We had discussed just drinking to thirst.  I called him w the fluid restriction of 50.7 ounces or 1500 ml.  He is appreciative for information.

## 2019-11-07 ENCOUNTER — Other Ambulatory Visit: Payer: Self-pay | Admitting: Family

## 2019-11-13 ENCOUNTER — Other Ambulatory Visit: Payer: Self-pay

## 2019-11-13 ENCOUNTER — Ambulatory Visit (INDEPENDENT_AMBULATORY_CARE_PROVIDER_SITE_OTHER): Payer: Medicare Other | Admitting: Family Medicine

## 2019-11-13 ENCOUNTER — Encounter (INDEPENDENT_AMBULATORY_CARE_PROVIDER_SITE_OTHER): Payer: Self-pay | Admitting: Family Medicine

## 2019-11-13 VITALS — BP 106/71 | HR 68 | Temp 98.2°F | Ht 69.0 in | Wt 203.0 lb

## 2019-11-13 DIAGNOSIS — Z6830 Body mass index (BMI) 30.0-30.9, adult: Secondary | ICD-10-CM

## 2019-11-13 DIAGNOSIS — E559 Vitamin D deficiency, unspecified: Secondary | ICD-10-CM | POA: Diagnosis not present

## 2019-11-13 DIAGNOSIS — R7303 Prediabetes: Secondary | ICD-10-CM

## 2019-11-13 DIAGNOSIS — E669 Obesity, unspecified: Secondary | ICD-10-CM | POA: Diagnosis not present

## 2019-11-13 MED ORDER — VITAMIN D (ERGOCALCIFEROL) 1.25 MG (50000 UNIT) PO CAPS: 50000.0000 [IU] | ORAL_CAPSULE | ORAL | 0 refills | Status: DC

## 2019-11-13 NOTE — Progress Notes (Signed)
Chief Complaint:   OBESITY [name] is here to discuss his progress with his obesity treatment plan along with follow-up of his obesity related diagnoses. [name] is on keeping a food journal and adhering to recommended goals of 1400-1600 calories and 90+ grams of protein daily and states he is following his eating plan approximately 50% of the time. [name] states he is walking for 60 minutes 3 times per week.  Today's visit was #: 6 Starting weight: 223 lbs Starting date: 08/15/2019 Today's weight: 203 lbs Today's date: 11/13/2019 Total lbs lost to date: 20 Total lbs lost since last in-office visit: 3  Interim History: [name] continues to do well with weight loss and journaling. His hunger is controlled and he is not struggling with cravings.  Subjective:   1. Vitamin D deficiency [name] is stable on Vit D, but his level is not yet at goal. He is due for labs.  2. Pre-diabetes [name]'s last A1c was 6.1, and he is working on diet and exercise. He notes decreased polyphagia.  Assessment/Plan:   1. Vitamin D deficiency Low Vitamin D level contributes to fatigue and are associated with obesity, breast, and colon cancer. We will check labs today, and we will refill prescription Vitamin D for 1 month. [name] will follow-up for routine testing of Vitamin D, at least 2-3 times per year to avoid over-replacement.  - Vitamin D, Ergocalciferol, (DRISDOL) 1.25 MG (50000 UNIT) CAPS capsule; Take 1 capsule (50,000 Units total) by mouth every 7 (seven) days.  Dispense: 4 capsule; Refill: 0 - VITAMIN D 25 Hydroxy (Vit-D Deficiency, Fractures)  2. Pre-diabetes [name] will continue to work on weight loss, exercise, and decreasing simple carbohydrates to help decrease the risk of diabetes. We will check labs today.  - Hemoglobin A1c - Insulin, random  3. Class 1 obesity with serious comorbidity and body mass index (BMI) of 30.0 to 30.9 in adult, unspecified obesity type [name] is currently in the action stage of  change. As such, his goal is to continue with weight loss efforts. He has agreed to keeping a food journal and adhering to recommended goals of 1400-1600 calories and 90+ grams of protein daily.   Exercise goals: As is.  Behavioral modification strategies: meal planning and cooking strategies.  [name] has agreed to follow-up with our clinic in 3 weeks. He was informed of the importance of frequent follow-up visits to maximize his success with intensive lifestyle modifications for his multiple health conditions.   [name] was informed we would discuss his lab results at his next visit unless there is a critical issue that needs to be addressed sooner. [name] agreed to keep his next visit at the agreed upon time to discuss these results.  Objective:   Blood pressure 106/71, pulse 68, temperature 98.2 F (36.8 C), temperature source Oral, height 5\' 9"  (1.753 m), weight 203 lb (92.1 kg), SpO2 97 %. Body mass index is 29.98 kg/m.  General: Cooperative, alert, well developed, in no acute distress. HEENT: Conjunctivae and lids unremarkable. Cardiovascular: Regular rhythm.  Lungs: Normal work of breathing. Neurologic: No focal deficits.   Lab Results  Component Value Date   CREATININE 1.24 10/31/2019   BUN 20 10/31/2019   NA 139 10/31/2019   K 4.3 10/31/2019   CL 101 10/31/2019   CO2 24 10/31/2019   Lab Results  Component Value Date   ALT 18 08/15/2019   AST 15 08/15/2019   ALKPHOS 49 08/15/2019   BILITOT 1.0 08/15/2019   Lab  Results  Component Value Date   HGBA1C 6.1 (H) 08/15/2019   HGBA1C 6.1 11/28/2018   HGBA1C 6.4 08/23/2017   HGBA1C 6.0 02/16/2017   HGBA1C 6.0 08/11/2016   Lab Results  Component Value Date   INSULIN 10.0 08/15/2019   Lab Results  Component Value Date   TSH 1.370 08/15/2019   Lab Results  Component Value Date   CHOL 161 04/16/2019   HDL 63.80 04/16/2019   LDLCALC 70 04/16/2019   TRIG 140.0 04/16/2019   CHOLHDL 3 04/16/2019   Lab Results    Component Value Date   WBC 4.7 10/31/2019   HGB 15.9 10/31/2019   HCT 48.5 10/31/2019   MCV 84 10/31/2019   PLT 132 (L) 10/31/2019   No results found for: IRON, TIBC, FERRITIN  Obesity Behavioral Intervention Documentation for Insurance:   Approximately 15 minutes were spent on the discussion below.  ASK: We discussed the diagnosis of obesity with [name] today and [name] agreed to give [place] permission to discuss obesity behavioral modification therapy today.  ASSESS: [name] has the diagnosis of obesity and his BMI today is 29.96. [name] is in the action stage of change.   ADVISE: [name] was educated on the multiple health risks of obesity as well as the benefit of weight loss to improve his health. He was advised of the need for long term treatment and the importance of lifestyle modifications to improve his current health and to decrease his risk of future health problems.  AGREE: Multiple dietary modification options and treatment options were discussed and [name] agreed to follow the recommendations documented in the above note.  ARRANGE: [name] was educated on the importance of frequent visits to treat obesity as outlined per CMS and USPSTF guidelines and agreed to schedule his next follow up appointment today.  Attestation Statements:   Reviewed by clinician on day of visit: allergies, medications, problem list, medical history, surgical history, family history, social history, and previous encounter notes.   I, Burt Knack, am acting as transcriptionist for Quillian Quince, MD.  I have reviewed the above documentation for accuracy and completeness, and I agree with the above. -  Quillian Quince, MD

## 2019-11-14 LAB — INSULIN, RANDOM: INSULIN: 10.2 u[IU]/mL (ref 2.6–24.9)

## 2019-11-14 LAB — HEMOGLOBIN A1C
Est. average glucose Bld gHb Est-mCnc: 123 mg/dL
Hgb A1c MFr Bld: 5.9 % — ABNORMAL HIGH (ref 4.8–5.6)

## 2019-11-14 LAB — VITAMIN D 25 HYDROXY (VIT D DEFICIENCY, FRACTURES): Vit D, 25-Hydroxy: 52.8 ng/mL (ref 30.0–100.0)

## 2019-11-20 ENCOUNTER — Other Ambulatory Visit: Payer: Self-pay | Admitting: Internal Medicine

## 2019-12-10 ENCOUNTER — Other Ambulatory Visit: Payer: Self-pay

## 2019-12-10 ENCOUNTER — Encounter (INDEPENDENT_AMBULATORY_CARE_PROVIDER_SITE_OTHER): Payer: Self-pay | Admitting: Family Medicine

## 2019-12-10 ENCOUNTER — Ambulatory Visit (INDEPENDENT_AMBULATORY_CARE_PROVIDER_SITE_OTHER): Payer: Medicare Other | Admitting: Family Medicine

## 2019-12-10 VITALS — BP 130/68 | HR 64 | Temp 98.0°F | Ht 69.0 in | Wt 200.0 lb

## 2019-12-10 DIAGNOSIS — Z6830 Body mass index (BMI) 30.0-30.9, adult: Secondary | ICD-10-CM

## 2019-12-10 DIAGNOSIS — E669 Obesity, unspecified: Secondary | ICD-10-CM | POA: Diagnosis not present

## 2019-12-10 DIAGNOSIS — E559 Vitamin D deficiency, unspecified: Secondary | ICD-10-CM

## 2019-12-10 MED ORDER — VITAMIN D (ERGOCALCIFEROL) 1.25 MG (50000 UNIT) PO CAPS: 50000.0000 [IU] | ORAL_CAPSULE | ORAL | 0 refills | Status: DC

## 2019-12-10 NOTE — Progress Notes (Signed)
Chief Complaint:   OBESITY [name] is here to discuss his progress with his obesity treatment plan along with follow-up of his obesity related diagnoses. [name] is on keeping a food journal and adhering to recommended goals of 1400-1600 calories and 90+ grams of protein daily and states he is following his eating plan approximately 80% of the time. [name] states he is walking and doing yard work for 60 minutes 2-3 times per week.  Today's visit was #: 7 Starting weight: 223 lbs Starting date: 08/15/2019 Today's weight: 200 lbs Today's date: 12/10/2019 Total lbs lost to date: 23 Total lbs lost since last in-office visit: 3  Interim History: [name] continues to do well with weight loss on his journaling plan. He is working on eating his protein goal.  Subjective:   1. Vitamin D deficiency [name] is stable on Vit D, and he denies nausea or vomiting. His Vit D level is now at goal.  Assessment/Plan:   1. Vitamin D deficiency Low Vitamin D level contributes to fatigue and are associated with obesity, breast, and colon cancer. We will refill prescription Vitamin D for 1 month, and we will recheck labs in 2 months. [name] will follow-up for routine testing of Vitamin D, at least 2-3 times per year to avoid over-replacement.  - Vitamin D, Ergocalciferol, (DRISDOL) 1.25 MG (50000 UNIT) CAPS capsule; Take 1 capsule (50,000 Units total) by mouth every 7 (seven) days.  Dispense: 4 capsule; Refill: 0  2. Class 1 obesity with serious comorbidity and body mass index (BMI) of 30.0 to 30.9 in adult, unspecified obesity type [name] is currently in the action stage of change. As such, his goal is to continue with weight loss efforts. He has agreed to keeping a food journal and adhering to recommended goals of 1400-1600 calories and 90+ grams of protein daily.   Exercise goals: As is.  Behavioral modification strategies: increasing lean protein intake, decreasing simple carbohydrates and meal planning and cooking  strategies.  [name] has agreed to follow-up with our clinic in 2 to 3 weeks. He was informed of the importance of frequent follow-up visits to maximize his success with intensive lifestyle modifications for his multiple health conditions.   Objective:   Blood pressure 130/68, pulse 64, temperature 98 F (36.7 C), height 5\' 9"  (1.753 m), weight 200 lb (90.7 kg), SpO2 100 %. Body mass index is 29.53 kg/m.  General: Cooperative, alert, well developed, in no acute distress. HEENT: Conjunctivae and lids unremarkable. Cardiovascular: Regular rhythm.  Lungs: Normal work of breathing. Neurologic: No focal deficits.   Lab Results  Component Value Date   CREATININE 1.24 10/31/2019   BUN 20 10/31/2019   NA 139 10/31/2019   K 4.3 10/31/2019   CL 101 10/31/2019   CO2 24 10/31/2019   Lab Results  Component Value Date   ALT 18 08/15/2019   AST 15 08/15/2019   ALKPHOS 49 08/15/2019   BILITOT 1.0 08/15/2019   Lab Results  Component Value Date   HGBA1C 5.9 (H) 11/13/2019   HGBA1C 6.1 (H) 08/15/2019   HGBA1C 6.1 11/28/2018   HGBA1C 6.4 08/23/2017   HGBA1C 6.0 02/16/2017   Lab Results  Component Value Date   INSULIN 10.2 11/13/2019   INSULIN 10.0 08/15/2019   Lab Results  Component Value Date   TSH 1.370 08/15/2019   Lab Results  Component Value Date   CHOL 161 04/16/2019   HDL 63.80 04/16/2019   LDLCALC 70 04/16/2019   TRIG 140.0 04/16/2019  CHOLHDL 3 04/16/2019   Lab Results  Component Value Date   WBC 4.7 10/31/2019   HGB 15.9 10/31/2019   HCT 48.5 10/31/2019   MCV 84 10/31/2019   PLT 132 (L) 10/31/2019   No results found for: IRON, TIBC, FERRITIN  Obesity Behavioral Intervention Documentation for Insurance:   Approximately 15 minutes were spent on the discussion below.  ASK: We discussed the diagnosis of obesity with [name] today and [name] agreed to give [place] permission to discuss obesity behavioral modification therapy today.  ASSESS: [name] has the diagnosis of  obesity and his BMI today is 29.52. [name] is in the action stage of change.   ADVISE: [name] was educated on the multiple health risks of obesity as well as the benefit of weight loss to improve his health. He was advised of the need for long term treatment and the importance of lifestyle modifications to improve his current health and to decrease his risk of future health problems.  AGREE: Multiple dietary modification options and treatment options were discussed and [name] agreed to follow the recommendations documented in the above note.  ARRANGE: [name] was educated on the importance of frequent visits to treat obesity as outlined per CMS and USPSTF guidelines and agreed to schedule his next follow up appointment today.  Attestation Statements:   Reviewed by clinician on day of visit: allergies, medications, problem list, medical history, surgical history, family history, social history, and previous encounter notes.   I, Burt Knack, am acting as transcriptionist for Quillian Quince, MD.  I have reviewed the above documentation for accuracy and completeness, and I agree with the above. -  Quillian Quince, MD

## 2019-12-20 ENCOUNTER — Ambulatory Visit: Payer: Medicare Other

## 2019-12-21 DIAGNOSIS — Z23 Encounter for immunization: Secondary | ICD-10-CM | POA: Diagnosis not present

## 2019-12-31 ENCOUNTER — Ambulatory Visit (INDEPENDENT_AMBULATORY_CARE_PROVIDER_SITE_OTHER): Payer: Medicare Other | Admitting: Family Medicine

## 2019-12-31 ENCOUNTER — Other Ambulatory Visit: Payer: Self-pay

## 2019-12-31 ENCOUNTER — Encounter (INDEPENDENT_AMBULATORY_CARE_PROVIDER_SITE_OTHER): Payer: Self-pay | Admitting: Family Medicine

## 2019-12-31 DIAGNOSIS — E559 Vitamin D deficiency, unspecified: Secondary | ICD-10-CM | POA: Diagnosis not present

## 2019-12-31 DIAGNOSIS — E669 Obesity, unspecified: Secondary | ICD-10-CM

## 2019-12-31 DIAGNOSIS — Z6830 Body mass index (BMI) 30.0-30.9, adult: Secondary | ICD-10-CM

## 2019-12-31 MED ORDER — VITAMIN D (ERGOCALCIFEROL) 1.25 MG (50000 UNIT) PO CAPS: 50000.0000 [IU] | ORAL_CAPSULE | ORAL | 0 refills | Status: DC

## 2020-01-01 NOTE — Progress Notes (Signed)
Chief Complaint:   OBESITY [name] is here to discuss his progress with his obesity treatment plan along with follow-up of his obesity related diagnoses. [name] is on keeping a food journal and adhering to recommended goals of 1400-1600 calories and 90+ grams of protein daily and states he is following his eating plan approximately 100% of the time. [name] states he is walking for 60 minutes 2-3 times per week.  Today's visit was #: 8 Starting weight: 223 lbs Starting date: 08/15/2019 Today's weight: 197 lbs Today's date: 12/31/2019 Total lbs lost to date: 26 Total lbs lost since last in-office visit: 3  Interim History: [name] is working on increasing his lean protein and is journaling well. He and his wife share the cooking and are both working on weight loss. His hunger is mostly controlled.  Subjective:   1. Vitamin D deficiency [name] is on Vit D, but his level is not yet at goal. He denies nausea or vomiting.  Assessment/Plan:   1. Vitamin D deficiency Low Vitamin D level contributes to fatigue and are associated with obesity, breast, and colon cancer. We will refill prescription Vitamin D for 1 month. [name] will follow-up for routine testing of Vitamin D, at least 2-3 times per year to avoid over-replacement.  - Vitamin D, Ergocalciferol, (DRISDOL) 1.25 MG (50000 UNIT) CAPS capsule; Take 1 capsule (50,000 Units total) by mouth every 7 (seven) days.  Dispense: 4 capsule; Refill: 0  2. Class 1 obesity with serious comorbidity and body mass index (BMI) of 30.0 to 30.9 in adult, unspecified obesity type [name] is currently in the action stage of change. As such, his goal is to continue with weight loss efforts. He has agreed to keeping a food journal and adhering to recommended goals of 1400-1600 calories and 90+ grams of protein daily.   Exercise goals: As is.  Behavioral modification strategies: meal planning and cooking strategies.  [name] has agreed to follow-up with our clinic in 2 to  3 weeks. He was informed of the importance of frequent follow-up visits to maximize his success with intensive lifestyle modifications for his multiple health conditions.   Objective:   There were no vitals taken for this visit. There is no height or weight on file to calculate BMI.  General: Cooperative, alert, well developed, in no acute distress. HEENT: Conjunctivae and lids unremarkable. Cardiovascular: Regular rhythm.  Lungs: Normal work of breathing. Neurologic: No focal deficits.   Lab Results  Component Value Date   CREATININE 1.24 10/31/2019   BUN 20 10/31/2019   NA 139 10/31/2019   K 4.3 10/31/2019   CL 101 10/31/2019   CO2 24 10/31/2019   Lab Results  Component Value Date   ALT 18 08/15/2019   AST 15 08/15/2019   ALKPHOS 49 08/15/2019   BILITOT 1.0 08/15/2019   Lab Results  Component Value Date   HGBA1C 5.9 (H) 11/13/2019   HGBA1C 6.1 (H) 08/15/2019   HGBA1C 6.1 11/28/2018   HGBA1C 6.4 08/23/2017   HGBA1C 6.0 02/16/2017   Lab Results  Component Value Date   INSULIN 10.2 11/13/2019   INSULIN 10.0 08/15/2019   Lab Results  Component Value Date   TSH 1.370 08/15/2019   Lab Results  Component Value Date   CHOL 161 04/16/2019   HDL 63.80 04/16/2019   LDLCALC 70 04/16/2019   TRIG 140.0 04/16/2019   CHOLHDL 3 04/16/2019   Lab Results  Component Value Date   WBC 4.7 10/31/2019   HGB 15.9  10/31/2019   HCT 48.5 10/31/2019   MCV 84 10/31/2019   PLT 132 (L) 10/31/2019   No results found for: IRON, TIBC, FERRITIN  Attestation Statements:   Reviewed by clinician on day of visit: allergies, medications, problem list, medical history, surgical history, family history, social history, and previous encounter notes.  Time spent on visit including pre-visit chart review and post-visit care and charting was 35 minutes.    I, Burt Knack, am acting as transcriptionist for Quillian Quince, MD.  I have reviewed the above documentation for accuracy and  completeness, and I agree with the above. -  Quillian Quince, MD

## 2020-01-21 ENCOUNTER — Ambulatory Visit (INDEPENDENT_AMBULATORY_CARE_PROVIDER_SITE_OTHER): Payer: Medicare Other | Admitting: Family Medicine

## 2020-01-21 ENCOUNTER — Encounter (INDEPENDENT_AMBULATORY_CARE_PROVIDER_SITE_OTHER): Payer: Self-pay | Admitting: Family Medicine

## 2020-01-21 ENCOUNTER — Other Ambulatory Visit: Payer: Self-pay

## 2020-01-21 VITALS — BP 114/77 | HR 72 | Temp 97.9°F | Ht 69.0 in | Wt 197.0 lb

## 2020-01-21 DIAGNOSIS — E559 Vitamin D deficiency, unspecified: Secondary | ICD-10-CM | POA: Diagnosis not present

## 2020-01-21 DIAGNOSIS — E669 Obesity, unspecified: Secondary | ICD-10-CM

## 2020-01-21 DIAGNOSIS — Z6830 Body mass index (BMI) 30.0-30.9, adult: Secondary | ICD-10-CM | POA: Diagnosis not present

## 2020-01-22 NOTE — Progress Notes (Signed)
Chief Complaint:   OBESITY [name] is here to discuss his progress with his obesity treatment plan along with follow-up of his obesity related diagnoses. [name] is on keeping a food journal and adhering to recommended goals of 1400-1600 calories and 90+ grams of protein daily and states he is following his eating plan approximately 50% of the time. [name] states he is walking for 60 minutes 4 times per week.  Today's visit was #: 9 Starting weight: 223 lbs Starting date: 08/15/2019 Today's weight: 197 lbs Today's date: 01/21/2020 Total lbs lost to date: 26 Total lbs lost since last in-office visit: 3  Interim History: [name] continues to do well with weight loss even over his birthday. He sometimes  Struggles to eat all of his food but has been doing better meeting protein goals with the help of his wife.  Subjective:   1. Vitamin D deficiency [name] is stable on Vit D prescription and OTC. L[ast Vit D level not yet at goal. He denies nausea or vomiting, and notes fatigue is improving.  Assessment/Plan:   1. Vitamin D deficiency Low Vitamin D level contributes to fatigue and are associated with obesity, breast, and colon cancer. [name] agreed to continue taking prescription Vitamin D as is, and we will plan to recheck labs in 3 months. He will follow-up for routine testing of Vitamin D, at least 2-3 times per year to avoid over-replacement.  2. Class 1 obesity with serious comorbidity and body mass index (BMI) of 30.0 to 30.9 in adult, unspecified obesity type [name] is currently in the action stage of change. As such, his goal is to continue with weight loss efforts. He has agreed to keeping a food journal and adhering to recommended goals of 1400-1600 calories and 90+ grams of protein daily.   Exercise goals: As is.  Behavioral modification strategies: no skipping meals and meal planning and cooking strategies.  [name] has agreed to follow-up with our clinic in 3 weeks. He was informed of the  importance of frequent follow-up visits to maximize his success with intensive lifestyle modifications for his multiple health conditions.   Objective:   Blood pressure 114/77, pulse 72, temperature 97.9 F (36.6 C), height 5\' 9"  (1.753 m), weight 197 lb (89.4 kg), SpO2 97 %. Body mass index is 29.09 kg/m.  General: Cooperative, alert, well developed, in no acute distress. HEENT: Conjunctivae and lids unremarkable. Cardiovascular: Regular rhythm.  Lungs: Normal work of breathing. Neurologic: No focal deficits.   Lab Results  Component Value Date   CREATININE 1.24 10/31/2019   BUN 20 10/31/2019   NA 139 10/31/2019   K 4.3 10/31/2019   CL 101 10/31/2019   CO2 24 10/31/2019   Lab Results  Component Value Date   ALT 18 08/15/2019   AST 15 08/15/2019   ALKPHOS 49 08/15/2019   BILITOT 1.0 08/15/2019   Lab Results  Component Value Date   HGBA1C 5.9 (H) 11/13/2019   HGBA1C 6.1 (H) 08/15/2019   HGBA1C 6.1 11/28/2018   HGBA1C 6.4 08/23/2017   HGBA1C 6.0 02/16/2017   Lab Results  Component Value Date   INSULIN 10.2 11/13/2019   INSULIN 10.0 08/15/2019   Lab Results  Component Value Date   TSH 1.370 08/15/2019   Lab Results  Component Value Date   CHOL 161 04/16/2019   HDL 63.80 04/16/2019   LDLCALC 70 04/16/2019   TRIG 140.0 04/16/2019   CHOLHDL 3 04/16/2019   Lab Results  Component Value Date  WBC 4.7 10/31/2019   HGB 15.9 10/31/2019   HCT 48.5 10/31/2019   MCV 84 10/31/2019   PLT 132 (L) 10/31/2019   No results found for: IRON, TIBC, FERRITIN  Attestation Statements:   Reviewed by clinician on day of visit: allergies, medications, problem list, medical history, surgical history, family history, social history, and previous encounter notes.  Time spent on visit including pre-visit chart review and post-visit care and charting was 33 minutes.    I, Burt Knack, am acting as transcriptionist for Quillian Quince, MD.  I have reviewed the above  documentation for accuracy and completeness, and I agree with the above. -  Quillian Quince, MD

## 2020-01-28 ENCOUNTER — Other Ambulatory Visit: Payer: Self-pay | Admitting: Family

## 2020-01-29 ENCOUNTER — Ambulatory Visit (INDEPENDENT_AMBULATORY_CARE_PROVIDER_SITE_OTHER): Payer: Medicare Other | Admitting: Family

## 2020-01-29 ENCOUNTER — Other Ambulatory Visit: Payer: Self-pay

## 2020-01-29 VITALS — BP 116/77 | HR 73 | Temp 98.4°F | Resp 16 | Ht 70.0 in | Wt 197.0 lb

## 2020-01-29 DIAGNOSIS — I2510 Atherosclerotic heart disease of native coronary artery without angina pectoris: Secondary | ICD-10-CM | POA: Diagnosis not present

## 2020-01-29 DIAGNOSIS — E785 Hyperlipidemia, unspecified: Secondary | ICD-10-CM

## 2020-01-29 DIAGNOSIS — G4733 Obstructive sleep apnea (adult) (pediatric): Secondary | ICD-10-CM | POA: Diagnosis not present

## 2020-01-29 DIAGNOSIS — I10 Essential (primary) hypertension: Secondary | ICD-10-CM | POA: Diagnosis not present

## 2020-01-29 MED ORDER — ATORVASTATIN CALCIUM 10 MG PO TABS: ORAL_TABLET | ORAL | 1 refills | Status: DC

## 2020-01-29 NOTE — Patient Instructions (Signed)
Please complete your lab work prior to leaving. Keep up the good work with healthy diet, exercise and weight loss.

## 2020-01-29 NOTE — Progress Notes (Signed)
Subjective:    Patient ID: [name], male    DOB: September 22, 1945, 74 y.o.   MRN: [id number]  HPI  Patient is a 74 yr old male who presents today for follow up.  HTN- on aldactone and carvedilol. He is also maintained on aldactone.  BP Readings from Last 3 Encounters:  01/29/20 116/77  01/21/20 114/77  12/10/19 130/68   He is working with the medical weight management clinic. He reports max weight up to 230 pounds. He has been growing a lot of his food out of his garden.   Wt Readings from Last 3 Encounters:  01/29/20 197 lb (89.4 kg)  01/21/20 197 lb (89.4 kg)  12/10/19 200 lb (90.7 kg)   He walks 2 miles a day.   Hyperlipidemia- on atorvastatin 10 mg once daily.  Lab Results  Component Value Date   CHOL 161 04/16/2019   HDL 63.80 04/16/2019   LDLCALC 70 04/16/2019   TRIG 140.0 04/16/2019   CHOLHDL 3 04/16/2019   OSA- reports that he is very compliant.   Review of Systems See HPI  Past Medical History:  Diagnosis Date  . Alcohol abuse   . Anomalous right coronary artery   . Chronic systolic CHF (congestive heart failure) (HCC) 08/18/2016   Echo 1/17:  Diff HK especially in inf wall, mild LVH, EF 30-35, mild AS (mean 10, peak 18) // Echo 11/15: Mild LVH, EF 30-35, moderate HK, Gr 1 DD, mild AI, mild RAE // Echo 8/18: Mild LVH, EF 30-35, diffuse HK, mild aortic stenosis (mean 7), mild AI, mild BAE  . Coronary artery calcification 08/18/2016   Cor CTA 1/08: Ca score 248 // LHC 1/08: oLAD 25%  . History of ETOH abuse   . Hyperlipidemia 08/27/2013  . Hypertension   . Lactose intolerance   . Non-ischemic cardiomyopathy (HCC)    Presumed secondary to Etoh and HTN  (Prev EF 20-25% -- improved to 50%) // LHC 1/08: oLAD 25, EF 25%   . OSA (obstructive sleep apnea) 10/17/2013   Severe per home sleep study performed on 09/26/13.   . Sleep apnea   . SOB (shortness of breath)   . Vitamin D deficiency      Social History   Socioeconomic History  . Marital status: Married      Spouse name: [name]  . Number of children: 3  . Years of education: Not on file  . Highest education level: Not on file  Occupational History  . Occupation: Retired Radiographer, therapeutic  Tobacco Use  . Smoking status: Former Smoker    Years: 20.00    Quit date: 04/12/2002    Years since quitting: 17.8  . Smokeless tobacco: Never Used  Vaping Use  . Vaping Use: Never used  Substance and Sexual Activity  . Alcohol use: Not Currently    Alcohol/week: 0.0 standard drinks  . Drug use: No    Comment: history of marijuana use  . Sexual activity: Yes  Other Topics Concern  . Not on file  Social History Narrative   2 sons and daughter   1 son in [name]. Living with wife 74yrs [name]).  Use to live near [place].   Social Determinants of Health   Financial Resource Strain: Low Risk   . Difficulty of Paying Living Expenses: Not hard at all  Food Insecurity: No Food Insecurity  . Worried About Programme researcher, broadcasting/film/video in the Last Year: Never true  . Ran Out of Food in the Last Year:  Never true  Transportation Needs: No Transportation Needs  . Lack of Transportation (Medical): No  . Lack of Transportation (Non-Medical): No  Physical Activity:   . Days of Exercise per Week: Not on file  . Minutes of Exercise per Session: Not on file  Stress:   . Feeling of Stress : Not on file  Social Connections:   . Frequency of Communication with Friends and Family: Not on file  . Frequency of Social Gatherings with Friends and Family: Not on file  . Attends Religious Services: Not on file  . Active Member of Clubs or Organizations: Not on file  . Attends Banker Meetings: Not on file  . Marital Status: Not on file  Intimate Partner Violence:   . Fear of Current or Ex-Partner: Not on file  . Emotionally Abused: Not on file  . Physically Abused: Not on file  . Sexually Abused: Not on file    No past surgical history on file.  Family History  Problem Relation Age of Onset  . Alcohol abuse  Mother   . Heart disease Mother   . High blood pressure Mother   . High Cholesterol Mother   . Sudden death Mother   . Alcohol abuse Father   . Heart failure Father   . Heart disease Maternal Grandmother   . Stroke Maternal Grandmother   . Diabetes Neg Hx     Allergies  Allergen Reactions  . Lisinopril Swelling    ANGIOEDEMA    Current Outpatient Medications on File Prior to Visit  Medication Sig Dispense Refill  . aspirin EC 81 MG tablet Take 1 tablet (81 mg total) by mouth daily. 90 tablet 3  . calcium-vitamin D (OSCAL WITH D) 500-200 MG-UNIT tablet Take 1 tablet by mouth.     . carvedilol (COREG) 12.5 MG tablet Take 1 tablet (12.5 mg total) by mouth 2 (two) times daily. 180 tablet 3  . furosemide (LASIX) 40 MG tablet Take 60 mg by mouth two times a week.  Take 40 mg by mouth all other days. 104 tablet 3  . Multiple Vitamins-Minerals (PRESERVISION AREDS 2+MULTI VIT PO) Take 1 tablet by mouth daily.    . potassium chloride (KLOR-CON) 10 MEQ tablet Take 1 tablet by mouth once daily 90 tablet 0  . spironolactone (ALDACTONE) 25 MG tablet Take 1/2 (one-half) tablet by mouth once daily 45 tablet 2  . Vitamin D, Ergocalciferol, (DRISDOL) 1.25 MG (50000 UNIT) CAPS capsule Take 1 capsule (50,000 Units total) by mouth every 7 (seven) days. 4 capsule 0   No current facility-administered medications on file prior to visit.    BP 116/77 (BP Location: Right Arm, Patient Position: Sitting, Cuff Size: Small)   Pulse 73   Temp 98.4 F (36.9 C) (Oral)   Resp 16   Ht 5\' 10"  (1.778 m)   Wt 197 lb (89.4 kg)   BMI 28.27 kg/m       Objective:   Physical Exam Constitutional:      General: He is not in acute distress.    Appearance: He is well-developed.  HENT:     Head: Normocephalic and atraumatic.  Cardiovascular:     Rate and Rhythm: Normal rate and regular rhythm.     Heart sounds: No murmur heard.   Pulmonary:     Effort: Pulmonary effort is normal. No respiratory distress.      Breath sounds: Normal breath sounds. No wheezing or rales.  Skin:    General: Skin is  warm and dry.  Neurological:     Mental Status: He is alert and oriented to person, place, and time.  Psychiatric:        Behavior: Behavior normal.        Thought Content: Thought content normal.           Assessment & Plan:  HTN- bp stable. Continue aldactone 25mg  once daily, carvedilol 12.5mg  on bid.   Hyperlipidemia- last lipid panel was at goal. Continue atorvastatin 10mg .   OSA- good compliance with CPAP.   This visit occurred during the SARS-CoV-2 public health emergency.  Safety protocols were in place, including screening questions prior to the visit, additional usage of staff PPE, and extensive cleaning of exam room while observing appropriate contact time as indicated for disinfecting solutions.

## 2020-01-30 LAB — BASIC METABOLIC PANEL
BUN/Creatinine Ratio: 21 (calc) (ref 6–22)
BUN: 27 mg/dL — ABNORMAL HIGH (ref 7–25)
CO2: 29 mmol/L (ref 20–32)
Calcium: 9.7 mg/dL (ref 8.6–10.3)
Chloride: 103 mmol/L (ref 98–110)
Creat: 1.28 mg/dL — ABNORMAL HIGH (ref 0.70–1.18)
Glucose, Bld: 100 mg/dL — ABNORMAL HIGH (ref 65–99)
Potassium: 4.6 mmol/L (ref 3.5–5.3)
Sodium: 140 mmol/L (ref 135–146)

## 2020-02-12 ENCOUNTER — Other Ambulatory Visit: Payer: Self-pay

## 2020-02-12 ENCOUNTER — Ambulatory Visit (INDEPENDENT_AMBULATORY_CARE_PROVIDER_SITE_OTHER): Payer: Medicare Other | Admitting: Family Medicine

## 2020-02-12 ENCOUNTER — Encounter (INDEPENDENT_AMBULATORY_CARE_PROVIDER_SITE_OTHER): Payer: Self-pay | Admitting: Family Medicine

## 2020-02-12 VITALS — BP 106/70 | HR 68 | Temp 98.1°F | Ht 69.0 in | Wt 197.0 lb

## 2020-02-12 DIAGNOSIS — E559 Vitamin D deficiency, unspecified: Secondary | ICD-10-CM | POA: Diagnosis not present

## 2020-02-12 DIAGNOSIS — Z6830 Body mass index (BMI) 30.0-30.9, adult: Secondary | ICD-10-CM | POA: Diagnosis not present

## 2020-02-12 DIAGNOSIS — R7303 Prediabetes: Secondary | ICD-10-CM

## 2020-02-12 DIAGNOSIS — E669 Obesity, unspecified: Secondary | ICD-10-CM | POA: Diagnosis not present

## 2020-02-12 MED ORDER — VITAMIN D (ERGOCALCIFEROL) 1.25 MG (50000 UNIT) PO CAPS: 50000.0000 [IU] | ORAL_CAPSULE | ORAL | 0 refills | Status: DC

## 2020-02-13 LAB — INSULIN, RANDOM: INSULIN: 7.6 u[IU]/mL (ref 2.6–24.9)

## 2020-02-13 LAB — COMPREHENSIVE METABOLIC PANEL
ALT: 31 IU/L (ref 0–44)
AST: 21 IU/L (ref 0–40)
Albumin/Globulin Ratio: 1.5 (ref 1.2–2.2)
Albumin: 4.3 g/dL (ref 3.7–4.7)
Alkaline Phosphatase: 65 IU/L (ref 44–121)
BUN/Creatinine Ratio: 17 (ref 10–24)
BUN: 21 mg/dL (ref 8–27)
Bilirubin Total: 0.8 mg/dL (ref 0.0–1.2)
CO2: 26 mmol/L (ref 20–29)
Calcium: 9.2 mg/dL (ref 8.6–10.2)
Chloride: 102 mmol/L (ref 96–106)
Creatinine, Ser: 1.23 mg/dL (ref 0.76–1.27)
GFR calc Af Amer: 67 mL/min/{1.73_m2} (ref >59)
GFR calc non Af Amer: 58 mL/min/{1.73_m2} — ABNORMAL LOW (ref >59)
Globulin, Total: 2.9 g/dL (ref 1.5–4.5)
Glucose: 105 mg/dL — ABNORMAL HIGH (ref 65–99)
Potassium: 4 mmol/L (ref 3.5–5.2)
Sodium: 141 mmol/L (ref 134–144)
Total Protein: 7.2 g/dL (ref 6.0–8.5)

## 2020-02-13 LAB — VITAMIN D 25 HYDROXY (VIT D DEFICIENCY, FRACTURES): Vit D, 25-Hydroxy: 63 ng/mL (ref 30.0–100.0)

## 2020-02-13 LAB — HEMOGLOBIN A1C
Est. average glucose Bld gHb Est-mCnc: 111 mg/dL
Hgb A1c MFr Bld: 5.5 % (ref 4.8–5.6)

## 2020-02-13 NOTE — Progress Notes (Signed)
Chief Complaint:   OBESITY [name] is here to discuss his progress with his obesity treatment plan along with follow-up of his obesity related diagnoses. [name] is on keeping a food journal and adhering to recommended goals of 1400-1600 calories and 90+ grams of protein daily and states he is following his eating plan approximately 100% of the time. [name] states he is walking for 60 minutes 6 times per week.  Today's visit was #: 10 Starting weight: 223 lbs Starting date: 08/15/2019 Today's weight: 197 lbs Today's date: 02/12/2020 Total lbs lost to date: 26 Total lbs lost since last in-office visit: 0  Interim History: [name] has done well maintaining his weight. He is doing excellent with cardio and he feels better overall. He is [name] with his weight loss.  Subjective:   1. Vitamin D deficiency [name] is stable on Vit D, and he denies nausea or vomiting.  2. Pre-diabetes [name] is doing well with diet and weight loss. He is due to have labs rechecked.  Assessment/Plan:   1. Vitamin D deficiency Low Vitamin D level contributes to fatigue and are associated with obesity, breast, and colon cancer. We will check labs today, and we will refill prescription Vitamin D for 1 month. [name] will follow-up for routine testing of Vitamin D, at least 2-3 times per year to avoid over-replacement.  - VITAMIN D 25 Hydroxy (Vit-D Deficiency, Fractures) - Vitamin D, Ergocalciferol, (DRISDOL) 1.25 MG (50000 UNIT) CAPS capsule; Take 1 capsule (50,000 Units total) by mouth every 7 (seven) days.  Dispense: 4 capsule; Refill: 0  2. Pre-diabetes [name] will continue to work on weight loss, exercise, and decreasing simple carbohydrates to help decrease the risk of diabetes. We will check labs today, and he will follow up as directed.  - Insulin, random - Hemoglobin A1c - Comprehensive metabolic panel  3. Class 1 obesity with serious comorbidity and body mass index (BMI) of 30.0 to 30.9 in adult, unspecified  obesity type [name] is currently in the action stage of change. As such, his goal is to continue with weight loss efforts. He has agreed to keeping a food journal and adhering to recommended goals of 1400-1600 calories and 90+ grams of protein daily.   [name]'s goal is to improve muscle mass at this stage versus losing more weight.  Exercise goals: As is, but add strengthening exercise to his routine.  Behavioral modification strategies: holiday eating strategies .  [name] has agreed to follow-up with our clinic in 4 weeks. He was informed of the importance of frequent follow-up visits to maximize his success with intensive lifestyle modifications for his multiple health conditions.   [name] was informed we would discuss his lab results at his next visit unless there is a critical issue that needs to be addressed sooner. [name] agreed to keep his next visit at the agreed upon time to discuss these results.  Objective:   Blood pressure 106/70, pulse 68, temperature 98.1 F (36.7 C), height 5\' 9"  (1.753 m), weight 197 lb (89.4 kg), SpO2 96 %. Body mass index is 29.09 kg/m.  General: Cooperative, alert, well developed, in no acute distress. HEENT: Conjunctivae and lids unremarkable. Cardiovascular: Regular rhythm.  Lungs: Normal work of breathing. Neurologic: No focal deficits.   Lab Results  Component Value Date   CREATININE 1.28 (H) 01/29/2020   BUN 27 (H) 01/29/2020   NA 140 01/29/2020   K 4.6 01/29/2020   CL 103 01/29/2020   CO2 29 01/29/2020   Lab Results  Component Value Date   ALT 18 08/15/2019   AST 15 08/15/2019   ALKPHOS 49 08/15/2019   BILITOT 1.0 08/15/2019   Lab Results  Component Value Date   HGBA1C 5.9 (H) 11/13/2019   HGBA1C 6.1 (H) 08/15/2019   HGBA1C 6.1 11/28/2018   HGBA1C 6.4 08/23/2017   HGBA1C 6.0 02/16/2017   Lab Results  Component Value Date   INSULIN 10.2 11/13/2019   INSULIN 10.0 08/15/2019   Lab Results  Component Value Date   TSH 1.370  08/15/2019   Lab Results  Component Value Date   CHOL 161 04/16/2019   HDL 63.80 04/16/2019   LDLCALC 70 04/16/2019   TRIG 140.0 04/16/2019   CHOLHDL 3 04/16/2019   Lab Results  Component Value Date   WBC 4.7 10/31/2019   HGB 15.9 10/31/2019   HCT 48.5 10/31/2019   MCV 84 10/31/2019   PLT 132 (L) 10/31/2019   No results found for: IRON, TIBC, FERRITIN  Obesity Behavioral Intervention:   Approximately 15 minutes were spent on the discussion below.  ASK: We discussed the diagnosis of obesity with [name] today and [name] agreed to give [place] permission to discuss obesity behavioral modification therapy today.  ASSESS: [name] has the diagnosis of obesity and his BMI today is 29.08. [name] is in the action stage of change.   ADVISE: [name] was educated on the multiple health risks of obesity as well as the benefit of weight loss to improve his health. He was advised of the need for long term treatment and the importance of lifestyle modifications to improve his current health and to decrease his risk of future health problems.  AGREE: Multiple dietary modification options and treatment options were discussed and [name] agreed to follow the recommendations documented in the above note.  ARRANGE: [name] was educated on the importance of frequent visits to treat obesity as outlined per CMS and USPSTF guidelines and agreed to schedule his next follow up appointment today.  Attestation Statements:   Reviewed by clinician on day of visit: allergies, medications, problem list, medical history, surgical history, family history, social history, and previous encounter notes.   I, Burt Knack, am acting as transcriptionist for Quillian Quince, MD.  I have reviewed the above documentation for accuracy and completeness, and I agree with the above. -  Quillian Quince, MD

## 2020-03-11 ENCOUNTER — Other Ambulatory Visit: Payer: Self-pay

## 2020-03-11 ENCOUNTER — Encounter (INDEPENDENT_AMBULATORY_CARE_PROVIDER_SITE_OTHER): Payer: Self-pay | Admitting: Family Medicine

## 2020-03-11 ENCOUNTER — Ambulatory Visit (INDEPENDENT_AMBULATORY_CARE_PROVIDER_SITE_OTHER): Payer: Medicare Other | Admitting: Family Medicine

## 2020-03-11 VITALS — BP 116/76 | HR 74 | Temp 97.9°F | Ht 69.0 in | Wt 199.0 lb

## 2020-03-11 DIAGNOSIS — E669 Obesity, unspecified: Secondary | ICD-10-CM | POA: Diagnosis not present

## 2020-03-11 DIAGNOSIS — R7303 Prediabetes: Secondary | ICD-10-CM

## 2020-03-11 DIAGNOSIS — Z6830 Body mass index (BMI) 30.0-30.9, adult: Secondary | ICD-10-CM

## 2020-03-11 DIAGNOSIS — E559 Vitamin D deficiency, unspecified: Secondary | ICD-10-CM | POA: Diagnosis not present

## 2020-03-11 MED ORDER — VITAMIN D (ERGOCALCIFEROL) 1.25 MG (50000 UNIT) PO CAPS: 50000.0000 [IU] | ORAL_CAPSULE | ORAL | 0 refills | Status: DC

## 2020-03-17 NOTE — Progress Notes (Signed)
Chief Complaint:   OBESITY [name] is here to discuss his progress with his obesity treatment plan along with follow-up of his obesity related diagnoses. [name] is on keeping a food journal and adhering to recommended goals of 1400-1600 calories and 90+ grams of protein daily and states he is following his eating plan approximately 75% of the time. [name] states he is walking for 60 minutes 5 times per week.  Today's visit was #: 11 Starting weight: 223 lbs Starting date: 08/15/2019 Today's weight: 199 lbs Today's date: 03/11/2020 Total lbs lost to date: 24 Total lbs lost since last in-office visit: 0  Interim History: [name] Francis done well minimizing weight gain over Thanksgiving. He is ready to get back on track with journaling, and he is working on increasing lean protein.  Subjective:   1. Vitamin D deficiency [name]'s Vit D level is now at goal on Vit D prescription, but he is not yet at risk of over-replacement. I discussed labs with the patient today.  2. Pre-diabetes [name]'s A1c is improving and his fasting insulin is also improving. He notes decreased polyphagia overall. I discussed labs with the patient today.  Assessment/Plan:   1. Vitamin D deficiency Low Vitamin D level contributes to fatigue and are associated with obesity, breast, and colon cancer. We will refill prescription Vitamin D for 1 month. [name] will follow-up for routine testing of Vitamin D, at least 2-3 times per year to avoid over-replacement.  - Vitamin D, Ergocalciferol, (DRISDOL) 1.25 MG (50000 UNIT) CAPS capsule; Take 1 capsule (50,000 Units total) by mouth every 7 (seven) days.  Dispense: 4 capsule; Refill: 0  2. Pre-diabetes [name] will continue to work on weight loss, diet, exercise, and decreasing simple carbohydrates to help decrease the risk of diabetes.   3. Class 1 obesity with serious comorbidity and body mass index (BMI) of 30.0 to 30.9 in adult, unspecified obesity type [name] is currently in the  action stage of change. As such, his goal is to continue with weight loss efforts. He Francis agreed to keeping a food journal and adhering to recommended goals of 1400-1600 calories and 90+ grams of protein daily.   Exercise goals: As is.  Behavioral modification strategies: meal planning and cooking strategies.  [name] Francis agreed to follow-up with our clinic in 2 to 3 weeks. He was informed of the importance of frequent follow-up visits to maximize his success with intensive lifestyle modifications for his multiple health conditions.   Objective:   Blood pressure 116/76, pulse 74, temperature 97.9 F (36.6 C), height 5\' 9"  (1.753 m), weight 199 lb (90.3 kg), SpO2 97 %. Body mass index is 29.39 kg/m.  General: Cooperative, alert, well developed, in no acute distress. HEENT: Conjunctivae and lids unremarkable. Cardiovascular: Regular rhythm.  Lungs: Normal work of breathing. Neurologic: No focal deficits.   Lab Results  Component Value Date   CREATININE 1.23 02/12/2020   BUN 21 02/12/2020   NA 141 02/12/2020   K 4.0 02/12/2020   CL 102 02/12/2020   CO2 26 02/12/2020   Lab Results  Component Value Date   ALT 31 02/12/2020   AST 21 02/12/2020   ALKPHOS 65 02/12/2020   BILITOT 0.8 02/12/2020   Lab Results  Component Value Date   HGBA1C 5.5 02/12/2020   HGBA1C 5.9 (H) 11/13/2019   HGBA1C 6.1 (H) 08/15/2019   HGBA1C 6.1 11/28/2018   HGBA1C 6.4 08/23/2017   Lab Results  Component Value Date   INSULIN 7.6 02/12/2020  INSULIN 10.2 11/13/2019   INSULIN 10.0 08/15/2019   Lab Results  Component Value Date   TSH 1.370 08/15/2019   Lab Results  Component Value Date   CHOL 161 04/16/2019   HDL 63.80 04/16/2019   LDLCALC 70 04/16/2019   TRIG 140.0 04/16/2019   CHOLHDL 3 04/16/2019   Lab Results  Component Value Date   WBC 4.7 10/31/2019   HGB 15.9 10/31/2019   HCT 48.5 10/31/2019   MCV 84 10/31/2019   PLT 132 (L) 10/31/2019   No results found for: IRON, TIBC,  FERRITIN  Obesity Behavioral Intervention:   Approximately 15 minutes were spent on the discussion below.  ASK: We discussed the diagnosis of obesity with [name] today and [name] agreed to give [place] permission to discuss obesity behavioral modification therapy today.  ASSESS: [name] the diagnosis of obesity and his BMI today is 29.37. [name] is in the action stage of change.   ADVISE: [name] was educated on the multiple health risks of obesity as well as the benefit of weight loss to improve his health. He was advised of the need for long term treatment and the importance of lifestyle modifications to improve his current health and to decrease his risk of future health problems.  AGREE: Multiple dietary modification options and treatment options were discussed and [name] agreed to follow the recommendations documented in the above note.  ARRANGE: [name] was educated on the importance of frequent visits to treat obesity as outlined per CMS and USPSTF guidelines and agreed to schedule his next follow up appointment today.  Attestation Statements:   Reviewed by clinician on day of visit: allergies, medications, problem list, medical history, surgical history, family history, social history, and previous encounter notes.   I, Burt Knack, am acting as transcriptionist for Quillian Quince, MD.  I have reviewed the above documentation for accuracy and completeness, and I agree with the above. -  Quillian Quince, MD

## 2020-03-25 ENCOUNTER — Other Ambulatory Visit: Payer: Self-pay

## 2020-03-25 ENCOUNTER — Ambulatory Visit (INDEPENDENT_AMBULATORY_CARE_PROVIDER_SITE_OTHER): Payer: Medicare Other | Admitting: Family Medicine

## 2020-03-25 ENCOUNTER — Encounter (INDEPENDENT_AMBULATORY_CARE_PROVIDER_SITE_OTHER): Payer: Self-pay | Admitting: Family Medicine

## 2020-03-25 VITALS — BP 104/68 | HR 70 | Temp 98.4°F | Ht 69.0 in | Wt 196.0 lb

## 2020-03-25 DIAGNOSIS — E559 Vitamin D deficiency, unspecified: Secondary | ICD-10-CM | POA: Diagnosis not present

## 2020-03-25 DIAGNOSIS — E669 Obesity, unspecified: Secondary | ICD-10-CM | POA: Diagnosis not present

## 2020-03-25 DIAGNOSIS — Z6830 Body mass index (BMI) 30.0-30.9, adult: Secondary | ICD-10-CM

## 2020-03-25 DIAGNOSIS — I10 Essential (primary) hypertension: Secondary | ICD-10-CM

## 2020-03-25 MED ORDER — VITAMIN D (ERGOCALCIFEROL) 1.25 MG (50000 UNIT) PO CAPS: 50000.0000 [IU] | ORAL_CAPSULE | ORAL | 0 refills | Status: DC

## 2020-03-26 NOTE — Progress Notes (Signed)
Chief Complaint:   OBESITY [name] is here to discuss his progress with his obesity treatment plan along with follow-up of his obesity related diagnoses. [name] is on keeping a food journal and adhering to recommended goals of 1400-1600 calories and 90+ grams of protein daily and states he is following his eating plan approximately 60-70% of the time. [name] states he is walking for 50 minutes 7 times per week.  Today's visit was #: 12 Starting weight: 223 lbs Starting date: 08/15/2019 Today's weight: 196 lbs Today's date: 03/25/2020 Total lbs lost to date: 27 Total lbs lost since last in-office visit: 3  Interim History: [name] continues to do well with weight loss even over Thanksgiving. He is journaling and working on increasing his protein, and he is doing well with monitoring his calories and protein.  Subjective:   1. Essential hypertension [name]'s blood pressure is well controlled. He denies signs of hypotension.   2. Vitamin D deficiency [name] is stable on Vit D, and he denies nausea or vomiting. He requests a refill today.  Assessment/Plan:   1. Essential hypertension [name] will continue healthy weight loss, diet, and exercise to improve blood pressure control. We will continue to monitor. He will watch for signs of hypotension as he continues his lifestyle modifications.  2. Vitamin D deficiency Low Vitamin D level contributes to fatigue and are associated with obesity, breast, and colon cancer. We will refill prescription Vitamin D for 1 month. [name] will follow-up for routine testing of Vitamin D, at least 2-3 times per year to avoid over-replacement.  - Vitamin D, Ergocalciferol, (DRISDOL) 1.25 MG (50000 UNIT) CAPS capsule; Take 1 capsule (50,000 Units total) by mouth every 7 (seven) days.  Dispense: 4 capsule; Refill: 0  3. Class 1 obesity with serious comorbidity and body mass index (BMI) of 30.0 to 30.9 in adult, unspecified obesity type [name] is currently in the action  stage of change. As such, his goal is to continue with weight loss efforts. He has agreed to keeping a food journal and adhering to recommended goals of 1400-1600 calories and 90+ grams of protein daily.   Exercise goals: As is.  Behavioral modification strategies: increasing water intake and holiday eating strategies .  [name] has agreed to follow-up with our clinic in 4 weeks. He was informed of the importance of frequent follow-up visits to maximize his success with intensive lifestyle modifications for his multiple health conditions.   Objective:   Blood pressure 104/68, pulse 70, temperature 98.4 F (36.9 C), height 5\' 9"  (1.753 m), weight 196 lb (88.9 kg), SpO2 96 %. Body mass index is 28.94 kg/m.  General: Cooperative, alert, well developed, in no acute distress. HEENT: Conjunctivae and lids unremarkable. Cardiovascular: Regular rhythm.  Lungs: Normal work of breathing. Neurologic: No focal deficits.   Lab Results  Component Value Date   CREATININE 1.23 02/12/2020   BUN 21 02/12/2020   NA 141 02/12/2020   K 4.0 02/12/2020   CL 102 02/12/2020   CO2 26 02/12/2020   Lab Results  Component Value Date   ALT 31 02/12/2020   AST 21 02/12/2020   ALKPHOS 65 02/12/2020   BILITOT 0.8 02/12/2020   Lab Results  Component Value Date   HGBA1C 5.5 02/12/2020   HGBA1C 5.9 (H) 11/13/2019   HGBA1C 6.1 (H) 08/15/2019   HGBA1C 6.1 11/28/2018   HGBA1C 6.4 08/23/2017   Lab Results  Component Value Date   INSULIN 7.6 02/12/2020   INSULIN 10.2 11/13/2019  INSULIN 10.0 08/15/2019   Lab Results  Component Value Date   TSH 1.370 08/15/2019   Lab Results  Component Value Date   CHOL 161 04/16/2019   HDL 63.80 04/16/2019   LDLCALC 70 04/16/2019   TRIG 140.0 04/16/2019   CHOLHDL 3 04/16/2019   Lab Results  Component Value Date   WBC 4.7 10/31/2019   HGB 15.9 10/31/2019   HCT 48.5 10/31/2019   MCV 84 10/31/2019   PLT 132 (L) 10/31/2019   No results found for: IRON, TIBC,  FERRITIN  Obesity Behavioral Intervention:   Approximately 15 minutes were spent on the discussion below.  ASK: We discussed the diagnosis of obesity with [name] today and [name] agreed to give [place] permission to discuss obesity behavioral modification therapy today.  ASSESS: [name] has the diagnosis of obesity and his BMI today is 28.93. [name] is in the action stage of change.   ADVISE: [name] was educated on the multiple health risks of obesity as well as the benefit of weight loss to improve his health. He was advised of the need for long term treatment and the importance of lifestyle modifications to improve his current health and to decrease his risk of future health problems.  AGREE: Multiple dietary modification options and treatment options were discussed and Darelle agreed to follow the recommendations documented in the above note.  ARRANGE: [name] was educated on the importance of frequent visits to treat obesity as outlined per CMS and USPSTF guidelines and agreed to schedule his next follow up appointment today.  Attestation Statements:   Reviewed by clinician on day of visit: allergies, medications, problem list, medical history, surgical history, family history, social history, and previous encounter notes.   I, Burt Knack, am acting as transcriptionist for Quillian Quince, MD.  I have reviewed the above documentation for accuracy and completeness, and I agree with the above. -  Quillian Quince, MD

## 2020-04-03 ENCOUNTER — Other Ambulatory Visit: Payer: Medicare Other

## 2020-04-03 ENCOUNTER — Other Ambulatory Visit: Payer: Self-pay

## 2020-04-03 DIAGNOSIS — Z20822 Contact with and (suspected) exposure to covid-19: Secondary | ICD-10-CM

## 2020-04-05 LAB — NOVEL CORONAVIRUS, NAA

## 2020-04-07 ENCOUNTER — Telehealth: Payer: Self-pay | Admitting: Family

## 2020-04-07 NOTE — Telephone Encounter (Signed)
Tried calling patient. No answer. Left detailed message in voicemail.

## 2020-04-07 NOTE — Telephone Encounter (Signed)
Please advise pt that his COVID test was indeterminant. If he is still having symptoms, I would recommend that he be retested.

## 2020-04-16 ENCOUNTER — Telehealth: Payer: Self-pay | Admitting: Internal Medicine

## 2020-04-16 NOTE — Telephone Encounter (Signed)
[name] is calling requesting his appointment on 04/21/20 be made virtual due to not wanting to be in a Doctor's office with everything that is going on with Covid right now. Please advise.

## 2020-04-21 ENCOUNTER — Other Ambulatory Visit: Payer: Self-pay

## 2020-04-21 ENCOUNTER — Encounter: Payer: Self-pay | Admitting: Internal Medicine

## 2020-04-21 ENCOUNTER — Telehealth (INDEPENDENT_AMBULATORY_CARE_PROVIDER_SITE_OTHER): Payer: Medicare Other | Admitting: Internal Medicine

## 2020-04-21 VITALS — BP 107/69 | HR 45 | Ht 70.0 in | Wt 196.5 lb

## 2020-04-21 DIAGNOSIS — I5022 Chronic systolic (congestive) heart failure: Secondary | ICD-10-CM | POA: Diagnosis not present

## 2020-04-21 NOTE — Patient Instructions (Signed)
Medication Instructions:  No changes *If you need a refill on your cardiac medications before your next appointment, please call your pharmacy*   Lab Work: Please schedule lab appointment (BMET)   Testing/Procedures: none   Follow-Up: At [facility], you and your health needs are our priority.  As part of our continuing mission to provide you with exceptional heart care, we have created designated Provider Care Teams.  These Care Teams include your primary Cardiologist (physician) and Advanced Practice Providers (APPs -  Physician Assistants and Nurse Practitioners) who all work together to provide you with the care you need, when you need it.  Your next appointment:   7 month(s)  The format for your next appointment:   In Person  Provider:   You may see Dietrich Pates, MD or one of the following Advanced Practice Providers on your designated Care Team:    Tereso Newcomer, PA-C  Chelsea Aus, [place]   Other Instructions

## 2020-04-21 NOTE — Progress Notes (Signed)
Virtual Visit via Telephone Note   This visit type was conducted due to national recommendations for restrictions regarding the COVID-19 Pandemic (e.g. social distancing) in an effort to limit this patient's exposure and mitigate transmission in our community.  Due to his co-morbid illnesses, this patient is at least at moderate risk for complications without adequate follow up.  This format is felt to be most appropriate for this patient at this time.  The patient did not have access to video technology/had technical difficulties with video requiring transitioning to audio format only (telephone).  All issues noted in this document were discussed and addressed.  No physical exam could be performed with this format.  Please refer to the patient's chart for his  consent to telehealth for [facility].    Date:  04/21/2020   ID:  [name], DOB 14-Mar-1946, MRN [id number] The patient was identified using 2 identifiers.  Patient Location: Home Provider Location: Office/Clinic  PCP:  Sandford Craze, NP Cardiologist:  Dietrich Pates, MD  Electrophysiologist:  None   Evaluation Performed:  Follow-Up Visit  Chief Complaint:  F/U of NICM and HTN   History of Present Illness:    [name] is a 75 y.o. male with a history of nonischemic cardiomyopathy, HTN, OSA on CPAP), mild AS.  Cath in 2008 minimal plaquing   Ca score 248   Hx of angioedema with lisinopril   He was admitted in  Sept 2020 with resp failure and CHF   Echo LVEF 35 to 40%  Diuresed and sent home   I last saw the pt in July 2021   He says he is doing OK    His lasix was stopped but he is still taking KCL Going to health and [employer] is coming down Dneies CP  Breathing is good, much better with wt down      The patient does not have symptoms concerning for COVID-19 infection (fever, chills, cough, or new shortness of breath).    Past Medical History:  Diagnosis Date  . Alcohol abuse   . Anomalous right  coronary artery   . Chronic systolic CHF (congestive heart failure) (HCC) 08/18/2016   Echo 1/17:  Diff HK especially in inf wall, mild LVH, EF 30-35, mild AS (mean 10, peak 18) // Echo 11/15: Mild LVH, EF 30-35, moderate HK, Gr 1 DD, mild AI, mild RAE // Echo 8/18: Mild LVH, EF 30-35, diffuse HK, mild aortic stenosis (mean 7), mild AI, mild BAE  . Coronary artery calcification 08/18/2016   Cor CTA 1/08: Ca score 248 // LHC 1/08: oLAD 25%  . History of ETOH abuse   . Hyperlipidemia 08/27/2013  . Hypertension   . Lactose intolerance   . Non-ischemic cardiomyopathy (HCC)    Presumed secondary to Etoh and HTN  (Prev EF 20-25% -- improved to 50%) // LHC 1/08: oLAD 25, EF 25%   . OSA (obstructive sleep apnea) 10/17/2013   Severe per home sleep study performed on 09/26/13.   . Sleep apnea   . SOB (shortness of breath)   . Vitamin D deficiency    No past surgical history on file.   Current Meds  Medication Sig  . aspirin EC 81 MG tablet Take 1 tablet (81 mg total) by mouth daily.  Marland Kitchen atorvastatin (LIPITOR) 10 MG tablet TAKE 1 TABLET BY MOUTH ONCE DAILY AT  6  00  PM  . carvedilol (COREG) 12.5 MG tablet Take 1 tablet (  12.5 mg total) by mouth 2 (two) times daily.  . Multiple Vitamins-Minerals (PRESERVISION AREDS 2+MULTI VIT PO) Take 1 tablet by mouth daily.  . potassium chloride (KLOR-CON) 10 MEQ tablet Take 1 tablet by mouth once daily  . spironolactone (ALDACTONE) 25 MG tablet Take 1/2 (one-half) tablet by mouth once daily  . Vitamin D, Ergocalciferol, (DRISDOL) 1.25 MG (50000 UNIT) CAPS capsule Take 1 capsule (50,000 Units total) by mouth every 7 (seven) days.     Allergies:   Lisinopril   Social History   Tobacco Use  . Smoking status: Former Smoker    Years: 20.00    Quit date: 04/12/2002    Years since quitting: 18.0  . Smokeless tobacco: Never Used  Vaping Use  . Vaping Use: Never used  Substance Use Topics  . Alcohol use: Not Currently    Alcohol/week: 0.0 standard drinks  . Drug  use: No    Comment: history of marijuana use     Family Hx: The patient's family history includes Alcohol abuse in his father and mother; Heart disease in his maternal grandmother and mother; Heart failure in his father; High Cholesterol in his mother; High blood pressure in his mother; Stroke in his maternal grandmother; Sudden death in his mother. There is no history of Diabetes.  ROS:   Please see the history of present illness.    All other systems reviewed and are negative.   Prior CV studies:    Echo Sept 2020  1. The left ventricle has moderately reduced systolic function, with an  ejection fraction of 35-40%. The cavity size was normal. There is mildly  increased left ventricular wall thickness. Left ventricular diastolic  Doppler parameters are consistent with  pseudonormalization. Elevated left atrial and left ventricular  end-diastolic pressures The E/e' is >15. Left ventricular diffuse  hypokinesis.  2. The mitral valve is grossly normal.  3. The tricuspid valve is grossly normal.  4. The aortic valve is grossly normal.  5. The aorta is normal unless otherwise noted.  6. The inferior vena cava was dilated in size with >50% respiratory  variability.  7. The interatrial septum was not well visualized.  8. When compared to the prior study: 11/11/2016: LVEF 30-35%.    Labs/Other Tests and Data Reviewed:    EKG:  No ECG reviewed.  Recent Labs: 08/15/2019: TSH 1.370 10/31/2019: Hemoglobin 15.9; NT-Pro BNP 1,336; Platelets 132 02/12/2020: ALT 31; BUN 21; Creatinine, Ser 1.23; Potassium 4.0; Sodium 141   Recent Lipid Panel Lab Results  Component Value Date/Time   CHOL 161 04/16/2019 07:38 AM   TRIG 140.0 04/16/2019 07:38 AM   HDL 63.80 04/16/2019 07:38 AM   CHOLHDL 3 04/16/2019 07:38 AM   LDLCALC 70 04/16/2019 07:38 AM    Wt Readings from Last 3 Encounters:  04/21/20 196 lb 8 oz (89.1 kg)  03/25/20 196 lb (88.9 kg)  03/11/20 199 lb (90.3 kg)     Risk  Assessment/Calculations:      Objective:    Vital Signs:  BP 107/69   Pulse (!) 45   Ht 5\' 10"  (1.778 m)   Wt 196 lb 8 oz (89.1 kg)   BMI 28.19 kg/m    No exam  ASSESSMENT & PLAN:    1. NICM   Pt denies symptoms  Actiually better with wt loss   Keep track of BP   If, with wt loss, his bp goes too low then cut back on B Blocker   Given that he is  off of lasix but still on KCL need BMET and BNP  2  HL  Keep on lipitor  LDL 70  HDL 63    3  BP   Continue to follow as noted          COVID-19 Education: The signs and symptoms of COVID-19 were discussed with the patient and how to seek care for testing (follow up with PCP or arrange E-visit).  The importance of social distancing was discussed today.  Time:   Today, I have spent 20 minutes with the patient with telehealth technology discussing the above problems.     Medication Adjustments/Labs and Tests Ordered: Current medicines are reviewed at length with the patient today.  Concerns regarding medicines are outlined above.   Tests Ordered: Orders Placed This Encounter  Procedures  . Basic metabolic panel    Medication Changes: No orders of the defined types were placed in this encounter.   Follow Up:  In Person later this year   Signed, Dietrich Pates, MD  04/21/2020 10:20 PM    [facility]    Date:  04/21/2020   ID:  Locklan, Canoy Jul 30, 1945, MRN [id number]  PCP:  Sandford Craze, NP  Cardiologist:   Dietrich Pates, MD   F/U of NICM     History of Present Illness: [name] is a 75 y.o. male with a history of nonischemic cardiomyopathy, HTN, OSA on CPAP), mild AS.  Cath in 2008 minimal plaquing   Ca score 248   Hx of angioedema with lisinopril   He was admitted in  Sept 2020 with resp failure and CHF   Echo LVEF 35 to 40%  Diuresed and sent home    I last saw the pt in Dec 2020  Since seen he has done [employer] is down   Dieting   He says that when he got below 220 lb his SOB  went away.    He walks about 2 miles in 60 min    Denies CP   No edema      Current Meds  Medication Sig  . aspirin EC 81 MG tablet Take 1 tablet (81 mg total) by mouth daily.  Marland Kitchen atorvastatin (LIPITOR) 10 MG tablet TAKE 1 TABLET BY MOUTH ONCE DAILY AT  6  00  PM  . carvedilol (COREG) 12.5 MG tablet Take 1 tablet (12.5 mg total) by mouth 2 (two) times daily.  . Multiple Vitamins-Minerals (PRESERVISION AREDS 2+MULTI VIT PO) Take 1 tablet by mouth daily.  . potassium chloride (KLOR-CON) 10 MEQ tablet Take 1 tablet by mouth once daily  . spironolactone (ALDACTONE) 25 MG tablet Take 1/2 (one-half) tablet by mouth once daily  . Vitamin D, Ergocalciferol, (DRISDOL) 1.25 MG (50000 UNIT) CAPS capsule Take 1 capsule (50,000 Units total) by mouth every 7 (seven) days.     Allergies:   Lisinopril   Past Medical History:  Diagnosis Date  . Alcohol abuse   . Anomalous right coronary artery   . Chronic systolic CHF (congestive heart failure) (HCC) 08/18/2016   Echo 1/17:  Diff HK especially in inf wall, mild LVH, EF 30-35, mild AS (mean 10, peak 18) // Echo 11/15: Mild LVH, EF 30-35, moderate HK, Gr 1 DD, mild AI, mild RAE // Echo 8/18: Mild LVH, EF 30-35, diffuse HK, mild aortic stenosis (mean 7), mild AI, mild BAE  . Coronary artery calcification 08/18/2016   Cor CTA 1/08: Ca score 248 //  LHC 1/08: oLAD 25%  . History of ETOH abuse   . Hyperlipidemia 08/27/2013  . Hypertension   . Lactose intolerance   . Non-ischemic cardiomyopathy (HCC)    Presumed secondary to Etoh and HTN  (Prev EF 20-25% -- improved to 50%) // LHC 1/08: oLAD 25, EF 25%   . OSA (obstructive sleep apnea) 10/17/2013   Severe per home sleep study performed on 09/26/13.   . Sleep apnea   . SOB (shortness of breath)   . Vitamin D deficiency     No past surgical history on file.   Social History:  The patient  reports that he quit smoking about 18 years ago. He quit after 20.00 years of use. He has never used smokeless tobacco.  He reports previous alcohol use. He reports that he does not use drugs.   Family History:  The patient's family history includes Alcohol abuse in his father and mother; Heart disease in his maternal grandmother and mother; Heart failure in his father; High Cholesterol in his mother; High blood pressure in his mother; Stroke in his maternal grandmother; Sudden death in his mother.    ROS:  Please see the history of present illness. All other systems are reviewed and  Negative to the above problem except as noted.    PHYSICAL EXAM: VS:  BP 107/69   Pulse (!) 45   Ht 5\' 10"  (1.778 m)   Wt 196 lb 8 oz (89.1 kg)   BMI 28.19 kg/m   GEN: Well nourished, well developed, in no acute distress  HEENT: normal  Neck: JVP normla  No carotid bruits Cardiac: RRR; no murmurs No LE  edema  Respiratory:  clear to auscultation bilaterally, normal work of breathing GI: soft, nontender, nondistended, + BS  No hepatomegaly  MS: no deformity Moving all extremities   Skin: warm and dry, no rash Neuro:  Strength and sensation are intact Psych: euthymic mood, full affect   EKG:  EKG is not ordered today.     Lipid Panel    Component Value Date/Time   CHOL 161 04/16/2019 0738   TRIG 140.0 04/16/2019 0738   HDL 63.80 04/16/2019 0738   CHOLHDL 3 04/16/2019 0738   VLDL 28.0 04/16/2019 0738   LDLCALC 70 04/16/2019 0738      Wt Readings from Last 3 Encounters:  04/21/20 196 lb 8 oz (89.1 kg)  03/25/20 196 lb (88.9 kg)  03/11/20 199 lb (90.3 kg)      ASSESSMENT AND PLAN:  1  Chronic systolic CHF Volume status remains good  He is Class I NYHA heart failure  I would keep on current regimen, not push further with BP    2  Lipids   Last LDL 70, HDL 64 (Jan 2021)  3  HTN  BP is good on current regimen     4  CAD   No symtpoms of angina  5  Weight loss   Congratulated him on loss   Encouraged him to stay active.    F/U next spring       Current medicines are reviewed at length with the  patient today.  The patient does not have concerns regarding medicines.  Signed, [birth year]01-1989, MD  04/21/2020 2:53 PM    [facility] [address] [place], [place], [place]  [place] Phone: [phone]; Fax: [phone]

## 2020-04-23 ENCOUNTER — Other Ambulatory Visit (INDEPENDENT_AMBULATORY_CARE_PROVIDER_SITE_OTHER): Payer: Self-pay | Admitting: Family Medicine

## 2020-04-23 ENCOUNTER — Other Ambulatory Visit: Payer: Self-pay | Admitting: Family

## 2020-04-23 DIAGNOSIS — E559 Vitamin D deficiency, unspecified: Secondary | ICD-10-CM

## 2020-04-24 ENCOUNTER — Ambulatory Visit (INDEPENDENT_AMBULATORY_CARE_PROVIDER_SITE_OTHER): Payer: Medicare Other | Admitting: Family Medicine

## 2020-04-24 NOTE — Telephone Encounter (Signed)
Refill request, Dr. Dalbert Garnet is out

## 2020-04-24 NOTE — Telephone Encounter (Signed)
Dr.Beasley 

## 2020-05-07 ENCOUNTER — Other Ambulatory Visit: Payer: Self-pay

## 2020-05-07 ENCOUNTER — Ambulatory Visit (INDEPENDENT_AMBULATORY_CARE_PROVIDER_SITE_OTHER): Payer: Medicare Other | Admitting: Family Medicine

## 2020-05-07 ENCOUNTER — Encounter (INDEPENDENT_AMBULATORY_CARE_PROVIDER_SITE_OTHER): Payer: Self-pay | Admitting: Family Medicine

## 2020-05-07 VITALS — BP 107/66 | HR 61 | Temp 98.3°F | Ht 69.0 in | Wt 195.0 lb

## 2020-05-07 DIAGNOSIS — I10 Essential (primary) hypertension: Secondary | ICD-10-CM | POA: Diagnosis not present

## 2020-05-07 DIAGNOSIS — E669 Obesity, unspecified: Secondary | ICD-10-CM

## 2020-05-07 DIAGNOSIS — Z6830 Body mass index (BMI) 30.0-30.9, adult: Secondary | ICD-10-CM

## 2020-05-07 DIAGNOSIS — E7849 Other hyperlipidemia: Secondary | ICD-10-CM | POA: Diagnosis not present

## 2020-05-08 NOTE — Progress Notes (Signed)
Chief Complaint:   OBESITY [name] is here to discuss his progress with his obesity treatment plan along with follow-up of his obesity related diagnoses. [name] is on keeping a food journal and adhering to recommended goals of 1400-1600 calories and 90+ grams of protein daily and states he is following his eating plan approximately 40% of the time. [name] states he is walking for 60 minutes 5-6 times per week.  Today's visit was #: 13 Starting weight: 223 lbs Starting date: 08/15/2019 Today's weight: 195 lbs Today's date: 05/07/2020 Total lbs lost to date: 28 Total lbs lost since last in-office visit: 0  Interim History: [name] has done well with maintaining his weight even with extra challenges. He is working on increasing protein and his hunger is mostly controlled.  Subjective:   1. Other hyperlipidemia [name] is on Lipitor, and he is doing well with diet and exercise. He denies chest pain or myalgias.  2. Essential hypertension [name] is on Coreg and Aldactone. He denies signs of hypotension even with low blood pressure. He informed his Cardiologist, who will also keep an eye on him.  Assessment/Plan:   1. Other hyperlipidemia Cardiovascular risk and specific lipid/LDL goals reviewed. We discussed several lifestyle modifications today. [name] will continue his medications, and will continue to work on diet, exercise and weight loss efforts. We will continue to monitor. Orders and follow up as documented in patient record.   2. Essential hypertension [name] is to hydrate, and continue working on healthy weight loss, diet, and exercise to improve blood pressure control. We will watch for signs of hypotension as he continues his lifestyle modifications.  3. Class 1 obesity with serious comorbidity and body mass index (BMI) of 30.0 to 30.9 in adult, unspecified obesity type [name] is currently in the action stage of change. As such, his goal is to continue with weight loss efforts. He has agreed to  keeping a food journal and adhering to recommended goals of 1400-1600 calories and 90+ grams of protein daily.   Exercise goals: As is.  Behavioral modification strategies: meal planning and cooking strategies and travel eating strategies.  [name] has agreed to follow-up with our clinic in 3 weeks. He was informed of the importance of frequent follow-up visits to maximize his success with intensive lifestyle modifications for his multiple health conditions.   Objective:   Blood pressure 107/66, pulse 61, temperature 98.3 F (36.8 C), height 5\' 9"  (1.753 m), weight 195 lb (88.5 kg), SpO2 98 %. Body mass index is 28.8 kg/m.  General: Cooperative, alert, well developed, in no acute distress. HEENT: Conjunctivae and lids unremarkable. Cardiovascular: Regular rhythm.  Lungs: Normal work of breathing. Neurologic: No focal deficits.   Lab Results  Component Value Date   CREATININE 1.23 02/12/2020   BUN 21 02/12/2020   NA 141 02/12/2020   K 4.0 02/12/2020   CL 102 02/12/2020   CO2 26 02/12/2020   Lab Results  Component Value Date   ALT 31 02/12/2020   AST 21 02/12/2020   ALKPHOS 65 02/12/2020   BILITOT 0.8 02/12/2020   Lab Results  Component Value Date   HGBA1C 5.5 02/12/2020   HGBA1C 5.9 (H) 11/13/2019   HGBA1C 6.1 (H) 08/15/2019   HGBA1C 6.1 11/28/2018   HGBA1C 6.4 08/23/2017   Lab Results  Component Value Date   INSULIN 7.6 02/12/2020   INSULIN 10.2 11/13/2019   INSULIN 10.0 08/15/2019   Lab Results  Component Value Date   TSH 1.370 08/15/2019  Lab Results  Component Value Date   CHOL 161 04/16/2019   HDL 63.80 04/16/2019   LDLCALC 70 04/16/2019   TRIG 140.0 04/16/2019   CHOLHDL 3 04/16/2019   Lab Results  Component Value Date   WBC 4.7 10/31/2019   HGB 15.9 10/31/2019   HCT 48.5 10/31/2019   MCV 84 10/31/2019   PLT 132 (L) 10/31/2019   No results found for: IRON, TIBC, FERRITIN  Attestation Statements:   Reviewed by clinician on day of visit:  allergies, medications, problem list, medical history, surgical history, family history, social history, and previous encounter notes.  Time spent on visit including pre-visit chart review and post-visit care and charting was 25 minutes.    I, Burt Knack, am acting as transcriptionist for Quillian Quince, MD.  I have reviewed the above documentation for accuracy and completeness, and I agree with the above. -  Quillian Quince, MD

## 2020-06-02 ENCOUNTER — Telehealth (INDEPENDENT_AMBULATORY_CARE_PROVIDER_SITE_OTHER): Payer: Medicare Other | Admitting: Family Medicine

## 2020-06-02 ENCOUNTER — Encounter (INDEPENDENT_AMBULATORY_CARE_PROVIDER_SITE_OTHER): Payer: Self-pay | Admitting: Family Medicine

## 2020-06-02 ENCOUNTER — Other Ambulatory Visit: Payer: Self-pay

## 2020-06-02 DIAGNOSIS — E559 Vitamin D deficiency, unspecified: Secondary | ICD-10-CM | POA: Diagnosis not present

## 2020-06-02 DIAGNOSIS — E66811 Obesity, class 1: Secondary | ICD-10-CM

## 2020-06-02 DIAGNOSIS — E669 Obesity, unspecified: Secondary | ICD-10-CM

## 2020-06-02 DIAGNOSIS — Z6830 Body mass index (BMI) 30.0-30.9, adult: Secondary | ICD-10-CM | POA: Diagnosis not present

## 2020-06-02 DIAGNOSIS — Z20822 Contact with and (suspected) exposure to covid-19: Secondary | ICD-10-CM

## 2020-06-02 MED ORDER — VITAMIN D (ERGOCALCIFEROL) 1.25 MG (50000 UNIT) PO CAPS: 50000.0000 [IU] | ORAL_CAPSULE | ORAL | 0 refills | Status: DC

## 2020-06-04 NOTE — Progress Notes (Signed)
TeleHealth Visit:  Due to the COVID-19 pandemic, this visit was completed with telemedicine (audio/video) technology to reduce patient and provider exposure as well as to preserve personal protective equipment.   [name] has verbally consented to this TeleHealth visit. The patient is located at home, the provider is located at the [facility] office. The participants in this visit include the listed provider and patient. The visit was conducted today via MyChart video.   Chief Complaint: OBESITY Job is here to discuss his progress with his obesity treatment plan along with follow-up of his obesity related diagnoses. [name] is on keeping a food journal and adhering to recommended goals of 1400-1600 calories and 90+ grams of protein daily and states he is following his eating plan approximately 60% of the time. Jeris states he is walking for 60 minutes 6 times per week.  Today's visit was #: 14 Starting weight: 223 lbs Starting date: 08/15/2019  Interim History: [name] has done well maintaining his weight loss. He traveled to see his family and had to do a lot of carryout eating, but he tried to still meet his protein goals. He is getting ready to go back to strict journaling.  Subjective:   1. Vitamin D deficiency [name] is stable on Vit D, and he denies nausea, vomiting, or muscle weakness. Last Vit D level was at goal. He is at slightly high risk of over-replacement with continued weight loss.  2. Close exposure to COVID-19 virus [name] was exposed to COVID while visiting family approximately 3-4 days ago. He is completely asymptomatic and he plans to take a home COVID test tomorrow. He is fully vaccinated and boosted.  Assessment/Plan:   1. Vitamin D deficiency Low Vitamin D level contributes to fatigue and are associated with obesity, breast, and colon cancer. We will refill prescription Vitamin D for 1 month, and we will recheck labs in 1 month. [name] will follow-up for routine  testing of Vitamin D, at least 2-3 times per year to avoid over-replacement.  - Vitamin D, Ergocalciferol, (DRISDOL) 1.25 MG (50000 UNIT) CAPS capsule; Take 1 capsule (50,000 Units total) by mouth once a week.  Dispense: 4 capsule; Refill: 0  2. Close exposure to COVID-19 virus [name] was encouraged to take the test today or tomorrow, and will follow up with results if positive. Orders and follow up as documented in patient record.  3. Class 1 obesity with serious comorbidity and body mass index (BMI) of 30.0 to 30.9 in adult, unspecified obesity type [name] is currently in the action stage of change. As such, his goal is to continue with weight loss efforts. He has agreed to keeping a food journal and adhering to recommended goals of 1400-1600 calories and 90+ grams of protein daily.   We will recheck fasting labs at his next visit.  Exercise goals: As is.  Behavioral modification strategies: decreasing eating out and meal planning and cooking strategies.  [name] has agreed to follow-up with our clinic in 3 weeks. He was informed of the importance of frequent follow-up visits to maximize his success with intensive lifestyle modifications for his multiple health conditions.  Objective:   VITALS: Per patient if applicable, see vitals. GENERAL: Alert and in no acute distress. CARDIOPULMONARY: No increased WOB. Speaking in clear sentences.  PSYCH: Pleasant and cooperative. Speech normal rate and rhythm. Affect is appropriate. Insight and judgement are appropriate. Attention is focused, linear, and appropriate.  NEURO: Oriented as arrived to appointment on time with no prompting.  Lab Results  Component Value Date   CREATININE 1.23 02/12/2020   BUN 21 02/12/2020   NA 141 02/12/2020   K 4.0 02/12/2020   CL 102 02/12/2020   CO2 26 02/12/2020   Lab Results  Component Value Date   ALT 31 02/12/2020   AST 21 02/12/2020   ALKPHOS 65 02/12/2020   BILITOT 0.8 02/12/2020   Lab Results   Component Value Date   HGBA1C 5.5 02/12/2020   HGBA1C 5.9 (H) 11/13/2019   HGBA1C 6.1 (H) 08/15/2019   HGBA1C 6.1 11/28/2018   HGBA1C 6.4 08/23/2017   Lab Results  Component Value Date   INSULIN 7.6 02/12/2020   INSULIN 10.2 11/13/2019   INSULIN 10.0 08/15/2019   Lab Results  Component Value Date   TSH 1.370 08/15/2019   Lab Results  Component Value Date   CHOL 161 04/16/2019   HDL 63.80 04/16/2019   LDLCALC 70 04/16/2019   TRIG 140.0 04/16/2019   CHOLHDL 3 04/16/2019   Lab Results  Component Value Date   WBC 4.7 10/31/2019   HGB 15.9 10/31/2019   HCT 48.5 10/31/2019   MCV 84 10/31/2019   PLT 132 (L) 10/31/2019   No results found for: IRON, TIBC, FERRITIN  Attestation Statements:   Reviewed by clinician on day of visit: allergies, medications, problem list, medical history, surgical history, family history, social history, and previous encounter notes.   I, Burt Knack, am acting as transcriptionist for Quillian Quince, MD.  I have reviewed the above documentation for accuracy and completeness, and I agree with the above. - Quillian Quince, MD

## 2020-06-24 ENCOUNTER — Other Ambulatory Visit: Payer: Self-pay | Admitting: Internal Medicine

## 2020-06-25 ENCOUNTER — Encounter (INDEPENDENT_AMBULATORY_CARE_PROVIDER_SITE_OTHER): Payer: Self-pay | Admitting: Physician Assistant

## 2020-06-25 ENCOUNTER — Ambulatory Visit (INDEPENDENT_AMBULATORY_CARE_PROVIDER_SITE_OTHER): Payer: Medicare Other | Admitting: Physician Assistant

## 2020-06-25 ENCOUNTER — Other Ambulatory Visit: Payer: Self-pay

## 2020-06-25 VITALS — BP 105/66 | HR 63 | Temp 97.9°F | Ht 69.0 in | Wt 193.0 lb

## 2020-06-25 DIAGNOSIS — E669 Obesity, unspecified: Secondary | ICD-10-CM

## 2020-06-25 DIAGNOSIS — E559 Vitamin D deficiency, unspecified: Secondary | ICD-10-CM | POA: Diagnosis not present

## 2020-06-25 DIAGNOSIS — E785 Hyperlipidemia, unspecified: Secondary | ICD-10-CM

## 2020-06-25 DIAGNOSIS — Z6830 Body mass index (BMI) 30.0-30.9, adult: Secondary | ICD-10-CM | POA: Diagnosis not present

## 2020-06-25 DIAGNOSIS — E7849 Other hyperlipidemia: Secondary | ICD-10-CM

## 2020-06-25 DIAGNOSIS — R7303 Prediabetes: Secondary | ICD-10-CM | POA: Diagnosis not present

## 2020-06-25 DIAGNOSIS — E66811 Obesity, class 1: Secondary | ICD-10-CM

## 2020-06-25 MED ORDER — VITAMIN D (ERGOCALCIFEROL) 1.25 MG (50000 UNIT) PO CAPS
50000.0000 [IU] | ORAL_CAPSULE | ORAL | 0 refills | Status: DC
Start: 2020-06-25 — End: 2020-07-16

## 2020-06-26 LAB — COMPREHENSIVE METABOLIC PANEL
ALT: 30 IU/L (ref 0–44)
AST: 25 IU/L (ref 0–40)
Albumin/Globulin Ratio: 1.5 (ref 1.2–2.2)
Albumin: 4.4 g/dL (ref 3.7–4.7)
Alkaline Phosphatase: 71 IU/L (ref 44–121)
BUN/Creatinine Ratio: 19 (ref 10–24)
BUN: 22 mg/dL (ref 8–27)
Bilirubin Total: 0.5 mg/dL (ref 0.0–1.2)
CO2: 25 mmol/L (ref 20–29)
Calcium: 9.3 mg/dL (ref 8.6–10.2)
Chloride: 101 mmol/L (ref 96–106)
Creatinine, Ser: 1.17 mg/dL (ref 0.76–1.27)
Globulin, Total: 2.9 g/dL (ref 1.5–4.5)
Glucose: 68 mg/dL (ref 65–99)
Potassium: 4.3 mmol/L (ref 3.5–5.2)
Sodium: 144 mmol/L (ref 134–144)
Total Protein: 7.3 g/dL (ref 6.0–8.5)
eGFR: 66 mL/min/{1.73_m2} (ref >59)

## 2020-06-26 LAB — LIPID PANEL
Chol/HDL Ratio: 2.6 ratio (ref 0.0–5.0)
Cholesterol, Total: 136 mg/dL (ref 100–199)
HDL: 53 mg/dL (ref >39)
LDL Chol Calc (NIH): 65 mg/dL (ref 0–99)
Triglycerides: 98 mg/dL (ref 0–149)
VLDL Cholesterol Cal: 18 mg/dL (ref 5–40)

## 2020-06-26 LAB — HEMOGLOBIN A1C
Est. average glucose Bld gHb Est-mCnc: 117 mg/dL
Hgb A1c MFr Bld: 5.7 % — ABNORMAL HIGH (ref 4.8–5.6)

## 2020-06-26 LAB — VITAMIN D 25 HYDROXY (VIT D DEFICIENCY, FRACTURES): Vit D, 25-Hydroxy: 53.4 ng/mL (ref 30.0–100.0)

## 2020-06-26 LAB — INSULIN, RANDOM: INSULIN: 6.3 u[IU]/mL (ref 2.6–24.9)

## 2020-06-29 ENCOUNTER — Other Ambulatory Visit: Payer: Self-pay | Admitting: Internal Medicine

## 2020-06-30 NOTE — Progress Notes (Signed)
Chief Complaint:   OBESITY [name] is here to discuss his progress with his obesity treatment plan along with follow-up of his obesity related diagnoses. [name] is on keeping a food journal and adhering to recommended goals of 1400-1600 calories and 90+ grams of protein daily and states he is following his eating plan approximately 60% of the time. [name] states he is walking for 60 minutes 5-6 times per week.  Today's visit was #: 15 Starting weight: 223 lbs Starting date: 08/15/2019 Today's weight: 193 lbs Today's date: 06/25/2020 Total lbs lost to date: 30 Total lbs lost since last in-office visit: 3  Interim History: Doc is not journaling consistently. He eats a lot of the same things daily and he feels that he gets enough protein. He wants to start strength training.  Subjective:   1. Vitamin D deficiency [name] is on Vit D weekly, and he denies nausea, vomiting, or muscle weakness.  2. Other hyperlipidemia [name] is on atorvastatin, and he is exercising regularly. He denies myalgias. He is managed by Cardiology.  3. Pre-diabetes [name] is not on medications, and his last A1c was 5.5. He is exercising regularly.  Assessment/Plan:   1. Vitamin D deficiency Low Vitamin D level contributes to fatigue and are associated with obesity, breast, and colon cancer. We will check labs today, and we will refill prescription Vitamin D for 1 month. [name] will follow-up for routine testing of Vitamin D, at least 2-3 times per year to avoid over-replacement.  - Vitamin D, Ergocalciferol, (DRISDOL) 1.25 MG (50000 UNIT) CAPS capsule; Take 1 capsule (50,000 Units total) by mouth once a week.  Dispense: 4 capsule; Refill: 0 - VITAMIN D 25 Hydroxy (Vit-D Deficiency, Fractures)  2. Other hyperlipidemia Cardiovascular risk and specific lipid/LDL goals reviewed. We discussed several lifestyle modifications today. [name] will continue his meal plan, and will continue to work on diet, exercise and weight loss  efforts. He will continue to follow up with Cardiology. We will check labs today. Orders and follow up as documented in patient record.   Counseling Intensive lifestyle modifications are the first line treatment for this issue. . Dietary changes: Increase soluble fiber. Decrease simple carbohydrates. . Exercise changes: Moderate to vigorous-intensity aerobic activity 150 minutes per week if tolerated. . Lipid-lowering medications: see documented in medical record.  - Lipid panel  3. Pre-diabetes [name] will continue exercise and his meal plan, and decreasing simple carbohydrates to help decrease the risk of diabetes. We will check labs today.  - Comprehensive metabolic panel - Hemoglobin A1c - Insulin, random  4. Class 1 obesity with serious comorbidity and body mass index (BMI) of 30.0 to 30.9 in adult, unspecified obesity type [name] is currently in the action stage of change. As such, his goal is to continue with weight loss efforts. He has agreed to keeping a food journal and adhering to recommended goals of 1600 calories and 90 grams of protein daily.   Exercise goals: As is.  Behavioral modification strategies: planning for success and keeping a strict food journal.  [name] has agreed to follow-up with our clinic in 3 weeks. He was informed of the importance of frequent follow-up visits to maximize his success with intensive lifestyle modifications for his multiple health conditions.   [name] was informed we would discuss his lab results at his next visit unless there is a critical issue that needs to be addressed sooner. [name] agreed to keep his next visit at the agreed upon time to discuss these results.  Objective:   Blood pressure 105/66, pulse 63, temperature 97.9 F (36.6 C), height 5\' 9"  (1.753 m), weight 193 lb (87.5 kg), SpO2 96 %. Body mass index is 28.5 kg/m.  General: Cooperative, alert, well developed, in no acute distress. HEENT: Conjunctivae and lids  unremarkable. Cardiovascular: Regular rhythm.  Lungs: Normal work of breathing. Neurologic: No focal deficits.   Lab Results  Component Value Date   CREATININE 1.17 06/25/2020   BUN 22 06/25/2020   NA 144 06/25/2020   K 4.3 06/25/2020   CL 101 06/25/2020   CO2 25 06/25/2020   Lab Results  Component Value Date   ALT 30 06/25/2020   AST 25 06/25/2020   ALKPHOS 71 06/25/2020   BILITOT 0.5 06/25/2020   Lab Results  Component Value Date   HGBA1C 5.7 (H) 06/25/2020   HGBA1C 5.5 02/12/2020   HGBA1C 5.9 (H) 11/13/2019   HGBA1C 6.1 (H) 08/15/2019   HGBA1C 6.1 11/28/2018   Lab Results  Component Value Date   INSULIN 6.3 06/25/2020   INSULIN 7.6 02/12/2020   INSULIN 10.2 11/13/2019   INSULIN 10.0 08/15/2019   Lab Results  Component Value Date   TSH 1.370 08/15/2019   Lab Results  Component Value Date   CHOL 136 06/25/2020   HDL 53 06/25/2020   LDLCALC 65 06/25/2020   TRIG 98 06/25/2020   CHOLHDL 2.6 06/25/2020   Lab Results  Component Value Date   WBC 4.7 10/31/2019   HGB 15.9 10/31/2019   HCT 48.5 10/31/2019   MCV 84 10/31/2019   PLT 132 (L) 10/31/2019   No results found for: IRON, TIBC, FERRITIN  Obesity Behavioral Intervention:   Approximately 15 minutes were spent on the discussion below.  ASK: We discussed the diagnosis of obesity with 11/02/2019 today and Barnett agreed to give [name] permission to discuss obesity behavioral modification therapy today.  ASSESS: Eliazer has the diagnosis of obesity and his BMI today is 28.49. Daion is in the action stage of change.   ADVISE: Ladainian was educated on the multiple health risks of obesity as well as the benefit of weight loss to improve his health. He was advised of the need for long term treatment and the importance of lifestyle modifications to improve his current health and to decrease his risk of future health problems.  AGREE: Multiple dietary modification options and treatment options were discussed and Tamario agreed to  follow the recommendations documented in the above note.  ARRANGE: Heith was educated on the importance of frequent visits to treat obesity as outlined per CMS and USPSTF guidelines and agreed to schedule his next follow up appointment today.  Attestation Statements:   Reviewed by clinician on day of visit: allergies, medications, problem list, medical history, surgical history, family history, social history, and previous encounter notes.   [name], am acting as transcriptionist for Trude Mcburney, PA-C.  I have reviewed the above documentation for accuracy and completeness, and I agree with the above. -  *[employer], PA-C

## 2020-07-02 ENCOUNTER — Other Ambulatory Visit: Payer: Self-pay | Admitting: Internal Medicine

## 2020-07-04 ENCOUNTER — Telehealth: Payer: Self-pay

## 2020-07-04 NOTE — Telephone Encounter (Signed)
Patient returning call. Call mobile number.

## 2020-07-04 NOTE — Telephone Encounter (Signed)
Per last ov note, pt has stopped taking Lasix --the dose was 40 mg tablets with directions to take 40 mg daily except twice a week take 60 mg.   Left him a detailed message to call back with how he is taking the medication now.

## 2020-07-04 NOTE — Telephone Encounter (Signed)
[name] pt's medication furosemide was D/C because pt stated that he does not take this medication anymore. Pt states that he did not say this and that he is still taking this medication. Does Dr. Tenny Craw want to prescribe this medication again? Please address

## 2020-07-04 NOTE — Telephone Encounter (Signed)
Left message for pt to call back  °

## 2020-07-07 MED ORDER — FUROSEMIDE 20 MG PO TABS: ORAL_TABLET | ORAL | 3 refills | Status: DC

## 2020-07-07 NOTE — Telephone Encounter (Signed)
Send refill of lasix 20 mg to pharmacy with instructions for one tablet daily except Sundays/Wednesdays take extra half tablet.  Routing to Dr. Tenny Craw to make aware pt did not stop this medication after the televisit in Jan 2022 with Dr. Tenny Craw.  He left a message confirming this dose/directions.  If there are new recommendations from Dr. Tenny Craw I will call pt with those.  He has recent normal electrolytes and kidney function in Epic.

## 2020-07-07 NOTE — Telephone Encounter (Signed)
Reviewed labs   Keep on current meds then with lasix as taking

## 2020-07-07 NOTE — Telephone Encounter (Signed)
*  STAT* If patient is at the pharmacy, call can be transferred to refill team.   1. Which medications need to be refilled? (please list name of each medication and dose if known) furosemide (LASIX) 20 MG tablet [[id number]]    2. Which pharmacy/location (including street and city if local pharmacy) is medication to be sent to? [facility] [address] [place] - [address]  3. Do they need a 30 day or 90 day supply? 90 day supply     Julie is returning Michalene's call from Friday. He states he was taking his Lasix once a day up until his recent appointment in [place]. At his appt in the beginning of the year with Dr. Tenny Craw he states he was advised by her to add half a tablet more on Sunday's and Wednesday's and has been taking it this way ever since. He is requesting a refill if he is still to be taking it be sent to the pharmacy due to almost being out. He would also still like a callback due to being confused as to why this was discontinued stating he was never advised of this and believes it to be a miscommunication.

## 2020-07-14 MED ORDER — FUROSEMIDE 40 MG PO TABS: 40.0000 mg | ORAL_TABLET | Freq: Every day | ORAL | 3 refills | Status: DC

## 2020-07-16 ENCOUNTER — Encounter (INDEPENDENT_AMBULATORY_CARE_PROVIDER_SITE_OTHER): Payer: Self-pay | Admitting: Physician Assistant

## 2020-07-16 ENCOUNTER — Other Ambulatory Visit: Payer: Self-pay

## 2020-07-16 ENCOUNTER — Ambulatory Visit (INDEPENDENT_AMBULATORY_CARE_PROVIDER_SITE_OTHER): Payer: Medicare Other | Admitting: Physician Assistant

## 2020-07-16 VITALS — BP 96/62 | HR 57 | Temp 97.5°F | Ht 69.0 in | Wt 190.0 lb

## 2020-07-16 DIAGNOSIS — E669 Obesity, unspecified: Secondary | ICD-10-CM | POA: Diagnosis not present

## 2020-07-16 DIAGNOSIS — E559 Vitamin D deficiency, unspecified: Secondary | ICD-10-CM | POA: Diagnosis not present

## 2020-07-16 DIAGNOSIS — R7303 Prediabetes: Secondary | ICD-10-CM

## 2020-07-16 DIAGNOSIS — Z6832 Body mass index (BMI) 32.0-32.9, adult: Secondary | ICD-10-CM | POA: Diagnosis not present

## 2020-07-16 MED ORDER — VITAMIN D (ERGOCALCIFEROL) 1.25 MG (50000 UNIT) PO CAPS: 50000.0000 [IU] | ORAL_CAPSULE | ORAL | 0 refills | Status: DC

## 2020-07-23 NOTE — Progress Notes (Signed)
Chief Complaint:   OBESITY [name] is here to discuss his progress with his obesity treatment plan along with follow-up of his obesity related diagnoses. [name] is on the Category 2 Plan [name] keeping a food journal [name] adhering to recommended goals of 1200 calories [name] 75 grams of protein [name] states he is following his eating plan approximately 20% of the time. [name] states he is walking for 60 minutes 7 times per week.  Today's visit was #: 16 Starting weight: 223 lbs Starting date: 08/15/2019 Today's weight: 190 lbs Today's date: 07/16/2020 Total lbs lost to date: 33 lbs Total lbs lost since last in-office visit: 3 lbs  Interim History: [name] did a good job with weight loss. He is watching his sodium intake. He is only journaling when he eats something new. There are some days he only eats 2 meals a day when he overindulges the day before. Weight training was discussed today.  Subjective:   1. Vitamin D deficiency [name]'s Vitamin D level was 53.4 on 06/25/2020. He is currently taking prescription vitamin D 50,000 IU each week. He denies nausea, vomiting or muscle weakness. Last level at goal.  2. Pre-diabetes [name] has a diagnosis of prediabetes based on his elevated HgA1c [name] was informed this puts him at greater risk of developing diabetes. He continues to work on diet [name] exercise to decrease his risk of diabetes. He denies nausea, hypoglycemia, or polyphagia. Last A1c was 5.7, worse than before at 5.5. Medication: None.   Lab Results  Component Value Date   HGBA1C 5.7 (H) 06/25/2020   Lab Results  Component Value Date   INSULIN 6.3 06/25/2020   INSULIN 7.6 02/12/2020   INSULIN 10.2 11/13/2019   INSULIN 10.0 08/15/2019   Assessment/Plan:   1. Vitamin D deficiency Low Vitamin D level contributes to fatigue [name] are associated with obesity, breast, [name] colon cancer. He agrees to continue to take prescription Vitamin D @50 ,000 IU every week [name] will follow-up for routine testing  of Vitamin D, at least 2-3 times per year to avoid over-replacement. Refill Vitamin D, as per below.  - Refill Vitamin D, Ergocalciferol, (DRISDOL) 1.25 MG (50000 UNIT) CAPS capsule; Take 1 capsule (50,000 Units total) by mouth once a week.  Dispense: 4 capsule; Refill: 0  2. Pre-diabetes [name] will continue to work on weight loss, exercise, [name] decreasing simple carbohydrates to help decrease the risk of diabetes. Continue with weight loss [name] exercise.  3. Obesity, current BMI 28.1  [name] is currently in the action stage of change. As such, his goal is to continue with weight loss efforts. He has agreed to keeping a food journal [name] adhering to recommended goals of 1200 calories [name] 75 grams of protein.   Exercise goals: As is.  Behavioral modification strategies: meal planning [name] cooking strategies [name] keeping healthy foods in the home.  [name] has agreed to follow-up with our clinic in 3 weeks. He was informed of the importance of frequent follow-up visits to maximize his success with intensive lifestyle modifications for his multiple health conditions.   Objective:   Blood pressure 96/62, pulse (!) 57, temperature (!) 97.5 F (36.4 C), height 5\' 9"  (1.753 m), weight 190 lb (86.2 kg), SpO2 96 %. Body mass index is 28.06 kg/m.  General: Cooperative, alert, well developed, in no acute distress. HEENT: Conjunctivae [name] lids unremarkable. Cardiovascular: Regular rhythm.  Lungs: Normal work of breathing. Neurologic: No focal deficits.   Lab Results  Component Value Date  CREATININE 1.17 06/25/2020   BUN 22 06/25/2020   NA 144 06/25/2020   K 4.3 06/25/2020   CL 101 06/25/2020   CO2 25 06/25/2020   Lab Results  Component Value Date   ALT 30 06/25/2020   AST 25 06/25/2020   ALKPHOS 71 06/25/2020   BILITOT 0.5 06/25/2020   Lab Results  Component Value Date   HGBA1C 5.7 (H) 06/25/2020   HGBA1C 5.5 02/12/2020   HGBA1C 5.9 (H) 11/13/2019   HGBA1C 6.1 (H) 08/15/2019   HGBA1C  6.1 11/28/2018   Lab Results  Component Value Date   INSULIN 6.3 06/25/2020   INSULIN 7.6 02/12/2020   INSULIN 10.2 11/13/2019   INSULIN 10.0 08/15/2019   Lab Results  Component Value Date   TSH 1.370 08/15/2019   Lab Results  Component Value Date   CHOL 136 06/25/2020   HDL 53 06/25/2020   LDLCALC 65 06/25/2020   TRIG 98 06/25/2020   CHOLHDL 2.6 06/25/2020   Lab Results  Component Value Date   WBC 4.7 10/31/2019   HGB 15.9 10/31/2019   HCT 48.5 10/31/2019   MCV 84 10/31/2019   PLT 132 (L) 10/31/2019   Obesity Behavioral Intervention:   Approximately 15 minutes were spent on the discussion below.  ASK: We discussed the diagnosis of obesity with [name] today [name] [name] agreed to give [place] permission to discuss obesity behavioral modification therapy today.  ASSESS: [name] has the diagnosis of obesity [name] his BMI today is 28.1. Arsh is in the action stage of change.   ADVISE: [name] was educated on the multiple health risks of obesity as well as the benefit of weight loss to improve his health. He was advised of the need for long term treatment [name] the importance of lifestyle modifications to improve his current health [name] to decrease his risk of future health problems.  AGREE: Multiple dietary modification options [name] treatment options were discussed [name] [name] agreed to follow the recommendations documented in the above note.  ARRANGE: [name] was educated on the importance of frequent visits to treat obesity as outlined per CMS [name] USPSTF guidelines [name] agreed to schedule his next follow up appointment today.  Attestation Statements:   Reviewed by clinician on day of visit: allergies, medications, problem list, medical history, surgical history, family history, social history, [name] previous encounter notes.  Carlos Levering Friedenbach, CMA, am acting as Energy manager for [employer], PA-C.  I have reviewed the above documentation for accuracy [name] completeness, [name] I agree with  the above. Alois Cliche, PA-C

## 2020-07-29 ENCOUNTER — Ambulatory Visit (INDEPENDENT_AMBULATORY_CARE_PROVIDER_SITE_OTHER): Payer: Medicare Other | Admitting: Family

## 2020-07-29 ENCOUNTER — Other Ambulatory Visit: Payer: Self-pay

## 2020-07-29 ENCOUNTER — Ambulatory Visit: Payer: Medicare Other | Attending: Internal Medicine

## 2020-07-29 DIAGNOSIS — N529 Male erectile dysfunction, unspecified: Secondary | ICD-10-CM | POA: Diagnosis not present

## 2020-07-29 DIAGNOSIS — Z6830 Body mass index (BMI) 30.0-30.9, adult: Secondary | ICD-10-CM

## 2020-07-29 DIAGNOSIS — Z23 Encounter for immunization: Secondary | ICD-10-CM

## 2020-07-29 DIAGNOSIS — I10 Essential (primary) hypertension: Secondary | ICD-10-CM | POA: Diagnosis not present

## 2020-07-29 DIAGNOSIS — I5023 Acute on chronic systolic (congestive) heart failure: Secondary | ICD-10-CM | POA: Diagnosis not present

## 2020-07-29 DIAGNOSIS — E669 Obesity, unspecified: Secondary | ICD-10-CM | POA: Diagnosis not present

## 2020-07-29 MED ORDER — SILDENAFIL CITRATE 20 MG PO TABS: 20.0000 mg | ORAL_TABLET | Freq: Three times a day (TID) | ORAL | 2 refills | Status: DC

## 2020-07-29 NOTE — Progress Notes (Signed)
Established Patient Office Visit  Subjective:  Patient ID: [name], male    DOB: April 13, 1945  Age: 75 y.o. MRN: [id number]  CC:  Chief Complaint  Patient presents with  . Hypertension    Here for follow up  . Hyperlipidemia    Here for follow up  . Erectile Dysfunction    Will like to discuss erectile dysfunction    HPI [name] presents for follow up.    ED- requesting rx for viagra.    He has seen urology in the past.  He has previously tried penile injections which he did not like.  Wt Readings from Last 3 Encounters:  07/29/20 195 lb (88.5 kg)  07/16/20 190 lb (86.2 kg)  06/25/20 193 lb (87.5 kg)   HTN- on Aldactone, furosemide, and carvedilol 12.5 mg p.o. twice daily.  BP Readings from Last 3 Encounters:  07/29/20 99/64  07/16/20 96/62  06/25/20 105/66   Lab Results  Component Value Date   CHOL 136 06/25/2020   HDL 53 06/25/2020   LDLCALC 65 06/25/2020   TRIG 98 06/25/2020   CHOLHDL 2.6 06/25/2020   Lab Results  Component Value Date   HGBA1C 5.7 (H) 06/25/2020   HGBA1C 5.5 02/12/2020   HGBA1C 5.9 (H) 11/13/2019   Lab Results  Component Value Date   LDLCALC 65 06/25/2020   CREATININE 1.17 06/25/2020    Hyperlipidemia-patient is maintained on atorvastatin 10 mg p.o. daily.  Overweight-patient is working hard on his diet, exercise, and weight loss.   Past Medical History:  Diagnosis Date  . Alcohol abuse   . Anomalous right coronary artery   . Chronic systolic CHF (congestive heart failure) (HCC) 08/18/2016   Echo 1/17:  Diff HK especially in inf wall, mild LVH, EF 30-35, mild AS (mean 10, peak 18) // Echo 11/15: Mild LVH, EF 30-35, moderate HK, Gr 1 DD, mild AI, mild RAE // Echo 8/18: Mild LVH, EF 30-35, diffuse HK, mild aortic stenosis (mean 7), mild AI, mild BAE  . Coronary artery calcification 08/18/2016   Cor CTA 1/08: Ca score 248 // LHC 1/08: oLAD 25%  . History of ETOH abuse   . Hyperlipidemia 08/27/2013  . Hypertension   .  Lactose intolerance   . Non-ischemic cardiomyopathy (HCC)    Presumed secondary to Etoh and HTN  (Prev EF 20-25% -- improved to 50%) // LHC 1/08: oLAD 25, EF 25%   . OSA (obstructive sleep apnea) 10/17/2013   Severe per home sleep study performed on 09/26/13.   . Sleep apnea   . SOB (shortness of breath)   . Vitamin D deficiency     No past surgical history on file.  Family History  Problem Relation Age of Onset  . Alcohol abuse Mother   . Heart disease Mother   . High blood pressure Mother   . High Cholesterol Mother   . Sudden death Mother   . Alcohol abuse Father   . Heart failure Father   . Heart disease Maternal Grandmother   . Stroke Maternal Grandmother   . Diabetes Neg Hx     Social History   Socioeconomic History  . Marital status: Married    Spouse name: [name]  . Number of children: 3  . Years of education: Not on file  . Highest education level: Not on file  Occupational History  . Occupation: Retired Radiographer, therapeutic  Tobacco Use  . Smoking status: Former Smoker    Years: 20.00  Quit date: 04/12/2002    Years since quitting: 18.3  . Smokeless tobacco: Never Used  Vaping Use  . Vaping Use: Never used  Substance and Sexual Activity  . Alcohol use: Not Currently    Alcohol/week: 0.0 standard drinks  . Drug use: No    Comment: history of marijuana use  . Sexual activity: Yes  Other Topics Concern  . Not on file  Social History Narrative   2 sons and daughter   1 son in [name]. Living with wife 3yrs [name]).  Use to live near [place].   Social Determinants of Health   Financial Resource Strain: Low Risk   . Difficulty of Paying Living Expenses: Not hard at all  Food Insecurity: No Food Insecurity  . Worried About Programme researcher, broadcasting/film/video in the Last Year: Never true  . Ran Out of Food in the Last Year: Never true  Transportation Needs: No Transportation Needs  . Lack of Transportation (Medical): No  . Lack of Transportation (Non-Medical): No  Physical Activity:  Not on file  Stress: Not on file  Social Connections: Not on file  Intimate Partner Violence: Not on file    Outpatient Medications Prior to Visit  Medication Sig Dispense Refill  . aspirin EC 81 MG tablet Take 1 tablet (81 mg total) by mouth daily. 90 tablet 3  . atorvastatin (LIPITOR) 10 MG tablet TAKE 1 TABLET BY MOUTH ONCE DAILY AT  6  00  PM 90 tablet 1  . carvedilol (COREG) 12.5 MG tablet Take 1 tablet (12.5 mg total) by mouth 2 (two) times daily. 180 tablet 3  . furosemide (LASIX) 40 MG tablet Take 1 tablet (40 mg total) by mouth daily. Except Sundays and Wednesdays take ONE and HALF tablets 105 tablet 3  . Multiple Vitamins-Minerals (PRESERVISION AREDS 2+MULTI VIT PO) Take 1 tablet by mouth daily.    . potassium chloride (KLOR-CON) 10 MEQ tablet Take 1 tablet by mouth once daily 90 tablet 0  . spironolactone (ALDACTONE) 25 MG tablet Take 1/2 (one-half) tablet by mouth once daily 45 tablet 2  . Vitamin D, Ergocalciferol, (DRISDOL) 1.25 MG (50000 UNIT) CAPS capsule Take 1 capsule (50,000 Units total) by mouth once a week. 4 capsule 0   No facility-administered medications prior to visit.    Allergies  Allergen Reactions  . Lisinopril Swelling    ANGIOEDEMA    ROS Review of Systems    Objective:    Physical Exam Constitutional:      General: He is not in acute distress.    Appearance: He is well-developed.  HENT:     Head: Normocephalic and atraumatic.  Cardiovascular:     Rate and Rhythm: Normal rate and regular rhythm.     Heart sounds: No murmur heard.   Pulmonary:     Effort: Pulmonary effort is normal. No respiratory distress.     Breath sounds: Normal breath sounds. No wheezing or rales.  Skin:    General: Skin is warm and dry.  Neurological:     Mental Status: He is alert and oriented to person, place, and time.  Psychiatric:        Behavior: Behavior normal.        Thought Content: Thought content normal.     BP 99/64 (BP Location: Right Arm,  Patient Position: Sitting, Cuff Size: Large)   Pulse 72   Temp 98.3 F (36.8 C) (Oral)   Resp 16   Ht 5' 9.5" (1.765 m)   Wt  195 lb (88.5 kg)   SpO2 99%   BMI 28.38 kg/m  Wt Readings from Last 3 Encounters:  07/29/20 195 lb (88.5 kg)  07/16/20 190 lb (86.2 kg)  06/25/20 193 lb (87.5 kg)     Health Maintenance Due  Topic Date Due  . TETANUS/TDAP  01/28/2018    There are no preventive care reminders to display for this patient.  Lab Results  Component Value Date   TSH 1.370 08/15/2019   Lab Results  Component Value Date   WBC 4.7 10/31/2019   HGB 15.9 10/31/2019   HCT 48.5 10/31/2019   MCV 84 10/31/2019   PLT 132 (L) 10/31/2019   Lab Results  Component Value Date   NA 144 06/25/2020   K 4.3 06/25/2020   CO2 25 06/25/2020   GLUCOSE 68 06/25/2020   BUN 22 06/25/2020   CREATININE 1.17 06/25/2020   BILITOT 0.5 06/25/2020   ALKPHOS 71 06/25/2020   AST 25 06/25/2020   ALT 30 06/25/2020   PROT 7.3 06/25/2020   ALBUMIN 4.4 06/25/2020   CALCIUM 9.3 06/25/2020   ANIONGAP 12 12/17/2018   GFR 70.60 04/16/2019   Lab Results  Component Value Date   CHOL 136 06/25/2020   Lab Results  Component Value Date   HDL 53 06/25/2020   Lab Results  Component Value Date   LDLCALC 65 06/25/2020   Lab Results  Component Value Date   TRIG 98 06/25/2020   Lab Results  Component Value Date   CHOLHDL 2.6 06/25/2020   Lab Results  Component Value Date   HGBA1C 5.7 (H) 06/25/2020      Assessment & Plan:   Problem List Items Addressed This Visit      Unprioritized   Essential hypertension    Blood pressure is a bit low.  He is not currently having any symptoms of hypotension.  I did send a note to his cardiologist to discuss possibly decreasing carvedilol dose.  We will see what her thoughts are.      Relevant Medications   sildenafil (REVATIO) 20 MG tablet   Erectile dysfunction    Uncontrolled.  Will give trial of Revatio.  I also sent him a coupon for good  Rx.  Discussed that if this is unsuccessful that he should follow back up with urology.      Class 1 obesity with serious comorbidity and body mass index (BMI) of 30.0 to 30.9 in adult    He continues to work on healthy diet, exercise, and weight loss with the medical weight management clinic.  I commended him on his success of hard work.         Meds ordered this encounter  Medications  . sildenafil (REVATIO) 20 MG tablet    Sig: Take 1 tablet (20 mg total) by mouth 3 (three) times daily.    Dispense:  30 tablet    Refill:  2    Order Specific Question:   Supervising Provider    Answer:   Danise Edge A [[id number]]    Follow-up: Return in about 3 months (around 10/28/2020) for follow up visit.    Lemont Fillers, NP

## 2020-07-29 NOTE — Assessment & Plan Note (Signed)
He continues to work on [employer], exercise, and weight loss with the medical weight management clinic.  I commended him on his success of hard work.

## 2020-07-29 NOTE — Assessment & Plan Note (Signed)
Blood pressure is a bit low.  He is not currently having any symptoms of hypotension.  I did send a note to his cardiologist to discuss possibly decreasing carvedilol dose.  We will see what her thoughts are.

## 2020-07-29 NOTE — Progress Notes (Signed)
   Covid-19 Vaccination Clinic  Name:  [name]    MRN: [id number] DOB: 10-Mar-1946  07/29/2020  Mr. Hayman was observed post Covid-19 immunization for 15 minutes without incident. He was provided with Vaccine Information Sheet and instruction to access the V-Safe system.   Mr. Bellucci was instructed to call 911 with any severe reactions post vaccine: Marland Kitchen Difficulty breathing  . Swelling of face and throat  . A fast heartbeat  . A bad rash all over body  . Dizziness and weakness   Immunizations Administered    Name Date Dose VIS Date Route   PFIZER Comrnaty(Gray TOP) Covid-19 Vaccine 07/29/2020  8:53 AM 0.3 mL 03/20/2020 Intramuscular   Manufacturer: [employer], [employer]   Lot: [id number]   NDC: [id number]

## 2020-07-29 NOTE — Assessment & Plan Note (Signed)
Patient appears euvolemic.  Management per cardiology.

## 2020-07-29 NOTE — Assessment & Plan Note (Signed)
Uncontrolled.  Will give trial of Revatio.  I also sent him a coupon for good Rx.  Discussed that if this is unsuccessful that he should follow back up with urology.

## 2020-07-31 ENCOUNTER — Telehealth: Payer: Self-pay | Admitting: Family

## 2020-07-31 MED ORDER — FUROSEMIDE 40 MG PO TABS: 20.0000 mg | ORAL_TABLET | Freq: Every day | ORAL | 3 refills | Status: DC

## 2020-07-31 MED ORDER — CARVEDILOL 6.25 MG PO TABS: 6.2500 mg | ORAL_TABLET | Freq: Two times a day (BID) | ORAL | 5 refills | Status: DC

## 2020-07-31 NOTE — Telephone Encounter (Signed)
Patient advised of provider's decision,  new prescription for carvedilol and new dose for furosemide. He verbalized understanding and was scheduled to come back 08-29-2020.

## 2020-07-31 NOTE — Telephone Encounter (Signed)
-----   Message from Pricilla Riffle, MD sent at 07/29/2020  3:42 PM EDT ----- That sounds good   Follow   May need to cut back on lasix     Glad for the wt loss Gunnar Fusi  ----- Message ----- From: Sandford Craze, NP Sent: 07/29/2020   8:29 AM EDT To: Pricilla Riffle, MD  Dr. Tenny Craw,  Mr. Sweetland has been working hard on weight loss and has lost 35-40 pounds. BP is a bit soft. Today's bp was 99/64.  What do you think about cutting his carvedilol dose to 6.25?    Thanks,  [employer]

## 2020-07-31 NOTE — Telephone Encounter (Signed)
Please contact pt and let him know that I reviewed his blood pressure with Dr. Tenny Craw and she agreed that we should cut back his carvedilol.  Also, with his weight loss, she suggested that we try cutting his lasix back. I would like him to cut lasix in half to 20mg  once daily. Keep an eye on his weights.  If he has weight gain/increased swelling/sob, then he should return to the 40mg  and let know.  Let's see him back in 1 month please.

## 2020-08-01 ENCOUNTER — Other Ambulatory Visit (HOSPITAL_BASED_OUTPATIENT_CLINIC_OR_DEPARTMENT_OTHER): Payer: Self-pay

## 2020-08-01 DIAGNOSIS — Z23 Encounter for immunization: Secondary | ICD-10-CM | POA: Diagnosis not present

## 2020-08-01 MED ORDER — PFIZER-BIONT COVID-19 VAC-TRIS 30 MCG/0.3ML IM SUSP
INTRAMUSCULAR | 0 refills | Status: DC
  Filled 2020-08-01: qty 0.3, 1d supply, fill #0

## 2020-08-04 ENCOUNTER — Other Ambulatory Visit: Payer: Self-pay | Admitting: Family

## 2020-08-04 ENCOUNTER — Other Ambulatory Visit: Payer: Self-pay | Admitting: Internal Medicine

## 2020-08-06 ENCOUNTER — Telehealth: Payer: Self-pay | Admitting: *Deleted

## 2020-08-06 ENCOUNTER — Ambulatory Visit (INDEPENDENT_AMBULATORY_CARE_PROVIDER_SITE_OTHER): Payer: Medicare Other | Admitting: Physician Assistant

## 2020-08-06 ENCOUNTER — Encounter (INDEPENDENT_AMBULATORY_CARE_PROVIDER_SITE_OTHER): Payer: Self-pay | Admitting: Physician Assistant

## 2020-08-06 ENCOUNTER — Other Ambulatory Visit: Payer: Self-pay

## 2020-08-06 VITALS — BP 104/58 | HR 59 | Temp 97.9°F | Ht 69.0 in | Wt 194.0 lb

## 2020-08-06 DIAGNOSIS — E669 Obesity, unspecified: Secondary | ICD-10-CM | POA: Diagnosis not present

## 2020-08-06 DIAGNOSIS — Z6831 Body mass index (BMI) 31.0-31.9, adult: Secondary | ICD-10-CM | POA: Diagnosis not present

## 2020-08-06 DIAGNOSIS — E559 Vitamin D deficiency, unspecified: Secondary | ICD-10-CM

## 2020-08-06 MED ORDER — VITAMIN D (ERGOCALCIFEROL) 1.25 MG (50000 UNIT) PO CAPS: 50000.0000 [IU] | ORAL_CAPSULE | ORAL | 0 refills | Status: DC

## 2020-08-06 NOTE — Telephone Encounter (Signed)
Prior auth started via cover my meds.  Key: [id number].  Awaiting determination.

## 2020-08-07 NOTE — Progress Notes (Signed)
Chief Complaint:   OBESITY Pritesh is here to discuss his progress with his obesity treatment plan along with follow-up of his obesity related diagnoses. Baldwin is on keeping a food journal and adhering to recommended goals of 1200 calories and 75 grams of protein daily and states he is following his eating plan approximately 15% of the time. Jamal states he is walking 3-4 times per week.  Today's visit was #: 14 Starting weight: 223 lbs Starting date: 08/15/2019 Today's weight: 194 lbs Today's date: 08/06/2020 Total lbs lost to date: 29 Total lbs lost since last in-office visit: 0  Interim History: Chapin has not been exercising as much. He has been eating when he has time due to his wife having multiple medical appointments. He is up about 4 lbs of water today.  Subjective:   1. Vitamin D deficiency Haydn is on Vit D prescription weekly.  Assessment/Plan:   1. Vitamin D deficiency Low Vitamin D level contributes to fatigue and are associated with obesity, breast, and colon cancer. We will refill prescription Vitamin D for 1 month. Ansley will follow-up for routine testing of Vitamin D, at least 2-3 times per year to avoid over-replacement.  - Vitamin D, Ergocalciferol, (DRISDOL) 1.25 MG (50000 UNIT) CAPS capsule; Take 1 capsule (50,000 Units total) by mouth once a week.  Dispense: 4 capsule; Refill: 0  2. Class 1 obesity with serious comorbidity and body mass index (BMI) of 31.0 to 31.9 in adult, unspecified obesity type Raghav is currently in the action stage of change. As such, his goal is to continue with weight loss efforts. He has agreed to keeping a food journal and adhering to recommended goals of 1200 calories and 75 grams of protein daily.   Exercise goals: As is.  Behavioral modification strategies: meal planning and cooking strategies and keeping healthy foods in the home.  Sulayman has agreed to follow-up with our clinic in 3 weeks. He was informed of the importance of frequent  follow-up visits to maximize his success with intensive lifestyle modifications for his multiple health conditions.   Objective:   Blood pressure (!) 104/58, pulse (!) 59, temperature 97.9 F (36.6 C), height 5\' 9"  (1.753 m), weight 194 lb (88 kg), SpO2 99 %. Body mass index is 28.65 kg/m.  General: Cooperative, alert, well developed, in no acute distress. HEENT: Conjunctivae and lids unremarkable. Cardiovascular: Regular rhythm.  Lungs: Normal work of breathing. Neurologic: No focal deficits.   Lab Results  Component Value Date   CREATININE 1.17 06/25/2020   BUN 22 06/25/2020   NA 144 06/25/2020   K 4.3 06/25/2020   CL 101 06/25/2020   CO2 25 06/25/2020   Lab Results  Component Value Date   ALT 30 06/25/2020   AST 25 06/25/2020   ALKPHOS 71 06/25/2020   BILITOT 0.5 06/25/2020   Lab Results  Component Value Date   HGBA1C 5.7 (H) 06/25/2020   HGBA1C 5.5 02/12/2020   HGBA1C 5.9 (H) 11/13/2019   HGBA1C 6.1 (H) 08/15/2019   HGBA1C 6.1 11/28/2018   Lab Results  Component Value Date   INSULIN 6.3 06/25/2020   INSULIN 7.6 02/12/2020   INSULIN 10.2 11/13/2019   INSULIN 10.0 08/15/2019   Lab Results  Component Value Date   TSH 1.370 08/15/2019   Lab Results  Component Value Date   CHOL 136 06/25/2020   HDL 53 06/25/2020   LDLCALC 65 06/25/2020   TRIG 98 06/25/2020   CHOLHDL 2.6 06/25/2020   Lab  Results  Component Value Date   WBC 4.7 10/31/2019   HGB 15.9 10/31/2019   HCT 48.5 10/31/2019   MCV 84 10/31/2019   PLT 132 (L) 10/31/2019   No results found for: IRON, TIBC, FERRITIN  Obesity Behavioral Intervention:   Approximately 15 minutes were spent on the discussion below.  ASK: We discussed the diagnosis of obesity with [name] today and Tyrome agreed to give [place] permission to discuss obesity behavioral modification therapy today.  ASSESS: Chaze has the diagnosis of obesity and his BMI today is 28.64. Lenis is in the action stage of change.   ADVISE: Vir  was educated on the multiple health risks of obesity as well as the benefit of weight loss to improve his health. He was advised of the need for long term treatment and the importance of lifestyle modifications to improve his current health and to decrease his risk of future health problems.  AGREE: Multiple dietary modification options and treatment options were discussed and Mccrae agreed to follow the recommendations documented in the above note.  ARRANGE: Dwayne was educated on the importance of frequent visits to treat obesity as outlined per CMS and USPSTF guidelines and agreed to schedule his next follow up appointment today.  Attestation Statements:   Reviewed by clinician on day of visit: allergies, medications, problem list, medical history, surgical history, family history, social history, and previous encounter notes.   Trude Mcburney, am acting as transcriptionist for [employer], PA-C.  I have reviewed the above documentation for accuracy and completeness, and I agree with the above. Alois Cliche, PA-C

## 2020-08-07 NOTE — Telephone Encounter (Signed)
Sent to Dr. Tenny Craw in error.

## 2020-08-08 NOTE — Telephone Encounter (Signed)
Prior auth denied.  Insurance sent letter to patient.

## 2020-08-27 ENCOUNTER — Other Ambulatory Visit: Payer: Self-pay

## 2020-08-27 ENCOUNTER — Encounter (INDEPENDENT_AMBULATORY_CARE_PROVIDER_SITE_OTHER): Payer: Self-pay | Admitting: Physician Assistant

## 2020-08-27 ENCOUNTER — Ambulatory Visit (INDEPENDENT_AMBULATORY_CARE_PROVIDER_SITE_OTHER): Payer: Medicare Other | Admitting: Physician Assistant

## 2020-08-27 VITALS — BP 121/87 | HR 78 | Temp 97.7°F | Ht 69.0 in | Wt 189.0 lb

## 2020-08-27 DIAGNOSIS — R7303 Prediabetes: Secondary | ICD-10-CM

## 2020-08-27 DIAGNOSIS — E669 Obesity, unspecified: Secondary | ICD-10-CM | POA: Diagnosis not present

## 2020-08-27 DIAGNOSIS — Z6830 Body mass index (BMI) 30.0-30.9, adult: Secondary | ICD-10-CM | POA: Diagnosis not present

## 2020-08-27 NOTE — Progress Notes (Signed)
Chief Complaint:   OBESITY [name] is here to discuss his progress with his obesity treatment plan along with follow-up of his obesity related diagnoses. [name] is on keeping a food journal and adhering to recommended goals of 1200 calories and 75 grams of protein daily and states he is following his eating plan approximately 20% of the time. [name] states he is walking for 60 minutes 1-3 times per week.  Today's visit was #: 15 Starting weight: 223 lbs Starting date: 08/15/2019 Today's weight: 189 lbs Today's date: 08/27/2020 Total lbs lost to date: 34 Total lbs lost since last in-office visit: 5  Interim History: [name] did well with weight loss. He is being mindful of getting enough protein daily. He is doing a lot of physical work in the yard.  Subjective:   1. Pre-diabetes [name]'s last A1c was 5.7. He is not on medications, and he denies polyphagia.  Assessment/Plan:   1. Pre-diabetes [name] will continue his meal plan, and will continue to work on exercise, and decreasing simple carbohydrates to help decrease the risk of diabetes.   2. Class 1 obesity with serious comorbidity and body mass index (BMI) of 30.0 to 30.9 in adult, unspecified obesity type [name] is currently in the action stage of change. As such, his goal is to continue with weight loss efforts. He has agreed to keeping a food journal and adhering to recommended goals of 1200 calories and 85 grams of protein daily.   Exercise goals: As is.  Behavioral modification strategies: no skipping meals and meal planning and cooking strategies.  [name] has agreed to follow-up with our clinic in 3 weeks. He was informed of the importance of frequent follow-up visits to maximize his success with intensive lifestyle modifications for his multiple health conditions.   Objective:   Blood pressure 121/87, pulse 78, temperature 97.7 F (36.5 C), temperature source Oral, height 5\' 9"  (1.753 m), weight 189 lb (85.7 kg), SpO2 97 %. Body  mass index is 27.91 kg/m.  General: Cooperative, alert, well developed, in no acute distress. HEENT: Conjunctivae and lids unremarkable. Cardiovascular: Regular rhythm.  Lungs: Normal work of breathing. Neurologic: No focal deficits.   Lab Results  Component Value Date   CREATININE 1.17 06/25/2020   BUN 22 06/25/2020   NA 144 06/25/2020   K 4.3 06/25/2020   CL 101 06/25/2020   CO2 25 06/25/2020   Lab Results  Component Value Date   ALT 30 06/25/2020   AST 25 06/25/2020   ALKPHOS 71 06/25/2020   BILITOT 0.5 06/25/2020   Lab Results  Component Value Date   HGBA1C 5.7 (H) 06/25/2020   HGBA1C 5.5 02/12/2020   HGBA1C 5.9 (H) 11/13/2019   HGBA1C 6.1 (H) 08/15/2019   HGBA1C 6.1 11/28/2018   Lab Results  Component Value Date   INSULIN 6.3 06/25/2020   INSULIN 7.6 02/12/2020   INSULIN 10.2 11/13/2019   INSULIN 10.0 08/15/2019   Lab Results  Component Value Date   TSH 1.370 08/15/2019   Lab Results  Component Value Date   CHOL 136 06/25/2020   HDL 53 06/25/2020   LDLCALC 65 06/25/2020   TRIG 98 06/25/2020   CHOLHDL 2.6 06/25/2020   Lab Results  Component Value Date   WBC 4.7 10/31/2019   HGB 15.9 10/31/2019   HCT 48.5 10/31/2019   MCV 84 10/31/2019   PLT 132 (L) 10/31/2019   No results found for: IRON, TIBC, FERRITIN  Obesity Behavioral Intervention:   Approximately 15 minutes  were spent on the discussion below.  ASK: We discussed the diagnosis of obesity with [name] today and [name] agreed to give [place] permission to discuss obesity behavioral modification therapy today.  ASSESS: [name] has the diagnosis of obesity and his BMI today is 27.9. [name] is in the action stage of change.   ADVISE: [name] was educated on the multiple health risks of obesity as well as the benefit of weight loss to improve his health. He was advised of the need for long term treatment and the importance of lifestyle modifications to improve his current health and to decrease his risk of  future health problems.  AGREE: Multiple dietary modification options and treatment options were discussed and [name] agreed to follow the recommendations documented in the above note.  ARRANGE: [name] was educated on the importance of frequent visits to treat obesity as outlined per CMS and USPSTF guidelines and agreed to schedule his next follow up appointment today.  Attestation Statements:   Reviewed by clinician on day of visit: allergies, medications, problem list, medical history, surgical history, family history, social history, and previous encounter notes.   Trude Mcburney, am acting as transcriptionist for [employer], PA-C.  I have reviewed the above documentation for accuracy and completeness, and I agree with the above. Alois Cliche, PA-C

## 2020-08-29 ENCOUNTER — Encounter: Payer: Self-pay | Admitting: Family

## 2020-08-29 ENCOUNTER — Other Ambulatory Visit: Payer: Self-pay

## 2020-08-29 ENCOUNTER — Ambulatory Visit (INDEPENDENT_AMBULATORY_CARE_PROVIDER_SITE_OTHER): Payer: Medicare Other | Admitting: Family

## 2020-08-29 DIAGNOSIS — I5022 Chronic systolic (congestive) heart failure: Secondary | ICD-10-CM | POA: Diagnosis not present

## 2020-08-29 DIAGNOSIS — I10 Essential (primary) hypertension: Secondary | ICD-10-CM | POA: Diagnosis not present

## 2020-08-29 DIAGNOSIS — E785 Hyperlipidemia, unspecified: Secondary | ICD-10-CM | POA: Diagnosis not present

## 2020-08-29 NOTE — Progress Notes (Signed)
Subjective:   By signing my name below, I, Shehryar Baig, attest that this documentation has been prepared under the direction and in the presence of Debbrah Alar NP. 08/29/2020    Patient ID: [name], male    DOB: 05-17-1945, 75 y.o.   MRN: [id number]  No chief complaint on file.   HPI Patient is in today for a office visit. He has taken his 4th Covid-19 vaccine. He is willing to get the tetanus vaccine at his pharmacy.   Weight- He is continuing the weight loss program at this time. His weight is stable at this time.  Wt Readings from Last 3 Encounters:  08/29/20 193 lb (87.5 kg)  08/27/20 189 lb (85.7 kg)  08/06/20 194 lb (88 kg)    Hyperlipidemia- He is managing his cholesterol well on 10 mg Lipitor daily PO.  Hypertension- His blood pressure is being managed well at this time. He is taking 6.25 mg carvedilol daily PO and half 25 mg aldactone daily PO to manage his blood pressure. BP Readings from Last 3 Encounters:  08/29/20 120/79  08/27/20 121/87  08/06/20 (!) 104/58   CHF- denies LE edema on $Remove'40mg'XnXsBcl$  once daily.   Past Medical History:  Diagnosis Date  . Alcohol abuse   . Anomalous right coronary artery   . Chronic systolic CHF (congestive heart failure) ([place]) 08/18/2016   Echo 1/17:  Diff HK especially in inf wall, mild LVH, EF 30-35, mild AS (mean 10, peak 18) // Echo 11/15: Mild LVH, EF 30-35, moderate HK, Gr 1 DD, mild AI, mild RAE // Echo 8/18: Mild LVH, EF 30-35, diffuse HK, mild aortic stenosis (mean 7), mild AI, mild BAE  . Coronary artery calcification 08/18/2016   Cor CTA 1/08: Ca score 248 // LHC 1/08: oLAD 25%  . History of ETOH abuse   . Hyperlipidemia 08/27/2013  . Hypertension   . Lactose intolerance   . Non-ischemic cardiomyopathy ([place])    Presumed secondary to Etoh and HTN  (Prev EF 20-25% -- improved to 50%) // LHC 1/08: oLAD 25, EF 25%   . OSA (obstructive sleep apnea) 10/17/2013   Severe per home sleep study performed on 09/26/13.   .  Sleep apnea   . SOB (shortness of breath)   . Vitamin D deficiency     No past surgical history on file.  Family History  Problem Relation Age of Onset  . Alcohol abuse Mother   . Heart disease Mother   . High blood pressure Mother   . High Cholesterol Mother   . Sudden death Mother   . Alcohol abuse Father   . Heart failure Father   . Heart disease Maternal Grandmother   . Stroke Maternal Grandmother   . Diabetes Neg Hx     Social History   Socioeconomic History  . Marital status: Married    Spouse name: Constance Holster  . Number of children: 3  . Years of education: Not on file  . Highest education level: Not on file  Occupational History  . Occupation: Retired Development worker, community  Tobacco Use  . Smoking status: Former Smoker    Years: 20.00    Quit date: 04/12/2002    Years since quitting: 18.3  . Smokeless tobacco: Never Used  Vaping Use  . Vaping Use: Never used  Substance and Sexual Activity  . Alcohol use: Not Currently    Alcohol/week: 0.0 standard drinks  . Drug use: No    Comment: history of marijuana use  .  Sexual activity: Yes  Other Topics Concern  . Not on file  Social History Narrative   2 sons and daughter   1 son in [employer]. Living with wife 68yrs Constance Holster).  Use to live near [place].   Social Determinants of Health   Financial Resource Strain: Low Risk   . Difficulty of Paying Living Expenses: Not hard at all  Food Insecurity: No Food Insecurity  . Worried About Charity fundraiser in the Last Year: Never true  . Ran Out of Food in the Last Year: Never true  Transportation Needs: No Transportation Needs  . Lack of Transportation (Medical): No  . Lack of Transportation (Non-Medical): No  Physical Activity: Not on file  Stress: Not on file  Social Connections: Not on file  Intimate Partner Violence: Not on file    Outpatient Medications Prior to Visit  Medication Sig Dispense Refill  . aspirin EC 81 MG tablet Take 1 tablet (81 mg total) by mouth daily. 90  tablet 3  . atorvastatin (LIPITOR) 10 MG tablet TAKE 1 TABLET BY MOUTH ONCE DAILY AT  6  00  PM 90 tablet 0  . carvedilol (COREG) 6.25 MG tablet Take 1 tablet (6.25 mg total) by mouth 2 (two) times daily with a meal. 60 tablet 5  . COVID-19 mRNA Vac-TriS, Pfizer, (PFIZER-BIONT COVID-19 VAC-TRIS) SUSP injection Inject into the muscle. 0.3 mL 0  . furosemide (LASIX) 40 MG tablet Take 0.5 tablets (20 mg total) by mouth daily. Except Sundays and Wednesdays take ONE and HALF tablets 105 tablet 3  . Multiple Vitamins-Minerals (PRESERVISION AREDS 2+MULTI VIT PO) Take 1 tablet by mouth daily.    . potassium chloride (KLOR-CON) 10 MEQ tablet TAKE 1  BY MOUTH ONCE DAILY 90 tablet 0  . sildenafil (REVATIO) 20 MG tablet Take 1 tablet (20 mg total) by mouth 3 (three) times daily. 30 tablet 2  . spironolactone (ALDACTONE) 25 MG tablet Take 1/2 (one-half) tablet by mouth once daily 45 tablet 2  . Vitamin D, Ergocalciferol, (DRISDOL) 1.25 MG (50000 UNIT) CAPS capsule Take 1 capsule (50,000 Units total) by mouth once a week. 4 capsule 0   No facility-administered medications prior to visit.    Allergies  Allergen Reactions  . Lisinopril Swelling    ANGIOEDEMA    ROS     Objective:    Physical Exam Constitutional:      Appearance: Normal appearance.  HENT:     Head: Normocephalic and atraumatic.     Right Ear: External ear normal.     Left Ear: External ear normal.  Eyes:     Extraocular Movements: Extraocular movements intact.     Pupils: Pupils are equal, round, and reactive to light.  Cardiovascular:     Rate and Rhythm: Normal rate and regular rhythm.     Pulses: Normal pulses.     Heart sounds: Normal heart sounds. No murmur heard. No gallop.   Pulmonary:     Effort: Pulmonary effort is normal. No respiratory distress.     Breath sounds: Normal breath sounds. No wheezing, rhonchi or rales.  Skin:    General: Skin is warm and dry.  Neurological:     Mental Status: He is alert and  oriented to person, place, and time.  Psychiatric:        Behavior: Behavior normal.     There were no vitals taken for this visit. Wt Readings from Last 3 Encounters:  08/27/20 189 lb (85.7 kg)  08/06/20 194  lb (88 kg)  07/29/20 195 lb (88.5 kg)    Diabetic Foot Exam - Simple   No data filed    Lab Results  Component Value Date   WBC 4.7 10/31/2019   HGB 15.9 10/31/2019   HCT 48.5 10/31/2019   PLT 132 (L) 10/31/2019   GLUCOSE 68 06/25/2020   CHOL 136 06/25/2020   TRIG 98 06/25/2020   HDL 53 06/25/2020   LDLCALC 65 06/25/2020   ALT 30 06/25/2020   AST 25 06/25/2020   NA 144 06/25/2020   K 4.3 06/25/2020   CL 101 06/25/2020   CREATININE 1.17 06/25/2020   BUN 22 06/25/2020   CO2 25 06/25/2020   TSH 1.370 08/15/2019   PSA 0.82 02/28/2018   HGBA1C 5.7 (H) 06/25/2020    Lab Results  Component Value Date   TSH 1.370 08/15/2019   Lab Results  Component Value Date   WBC 4.7 10/31/2019   HGB 15.9 10/31/2019   HCT 48.5 10/31/2019   MCV 84 10/31/2019   PLT 132 (L) 10/31/2019   Lab Results  Component Value Date   NA 144 06/25/2020   K 4.3 06/25/2020   CO2 25 06/25/2020   GLUCOSE 68 06/25/2020   BUN 22 06/25/2020   CREATININE 1.17 06/25/2020   BILITOT 0.5 06/25/2020   ALKPHOS 71 06/25/2020   AST 25 06/25/2020   ALT 30 06/25/2020   PROT 7.3 06/25/2020   ALBUMIN 4.4 06/25/2020   CALCIUM 9.3 06/25/2020   ANIONGAP 12 12/17/2018   EGFR 66 06/25/2020   GFR 70.60 04/16/2019   Lab Results  Component Value Date   CHOL 136 06/25/2020   Lab Results  Component Value Date   HDL 53 06/25/2020   Lab Results  Component Value Date   LDLCALC 65 06/25/2020   Lab Results  Component Value Date   TRIG 98 06/25/2020   Lab Results  Component Value Date   CHOLHDL 2.6 06/25/2020   Lab Results  Component Value Date   HGBA1C 5.7 (H) 06/25/2020       Assessment & Plan:   Problem List Items Addressed This Visit   None      No orders of the defined types  were placed in this encounter.   I, Debbrah Alar NP, personally preformed the services described in this documentation.  All medical record entries made by the scribe were at my direction and in my presence.  I have reviewed the chart and discharge instructions (if applicable) and agree that the record reflects my personal performance and is accurate and complete. 08/29/2020   I,Shehryar Baig,acting as a Education administrator for Nance Pear, NP.,have documented all relevant documentation on the behalf of Nance Pear, NP,as directed by  Nance Pear, NP while in the presence of Nance Pear, NP.   Shehryar [employer]

## 2020-08-29 NOTE — Assessment & Plan Note (Signed)
Lab Results  Component Value Date   CHOL 136 06/25/2020   HDL 53 06/25/2020   LDLCALC 65 06/25/2020   TRIG 98 06/25/2020   CHOLHDL 2.6 06/25/2020   LDL at goal. Continue atorvastatin 10 mg once daily.

## 2020-08-29 NOTE — Assessment & Plan Note (Signed)
BP is stable. Continue carvedilol 6.25 bid and aldactone 12.5mg  daily.  BP Readings from Last 3 Encounters:  08/29/20 120/79  08/27/20 121/87  08/06/20 (!) 104/58

## 2020-08-29 NOTE — Assessment & Plan Note (Signed)
Pt appears euvolemic on lasix 40mg  once daily. Continue same.

## 2020-09-29 ENCOUNTER — Other Ambulatory Visit: Payer: Self-pay

## 2020-09-29 ENCOUNTER — Encounter (INDEPENDENT_AMBULATORY_CARE_PROVIDER_SITE_OTHER): Payer: Self-pay | Admitting: Family Medicine

## 2020-09-29 ENCOUNTER — Ambulatory Visit (INDEPENDENT_AMBULATORY_CARE_PROVIDER_SITE_OTHER): Payer: Medicare Other | Admitting: Family Medicine

## 2020-09-29 VITALS — BP 108/53 | HR 67 | Temp 97.5°F | Ht 69.0 in | Wt 189.0 lb

## 2020-09-29 DIAGNOSIS — Z6832 Body mass index (BMI) 32.0-32.9, adult: Secondary | ICD-10-CM | POA: Diagnosis not present

## 2020-09-29 DIAGNOSIS — E559 Vitamin D deficiency, unspecified: Secondary | ICD-10-CM

## 2020-09-29 DIAGNOSIS — E669 Obesity, unspecified: Secondary | ICD-10-CM

## 2020-09-29 MED ORDER — VITAMIN D (ERGOCALCIFEROL) 1.25 MG (50000 UNIT) PO CAPS: 50000.0000 [IU] | ORAL_CAPSULE | ORAL | 0 refills | Status: DC

## 2020-09-30 NOTE — Progress Notes (Signed)
Chief Complaint:   OBESITY [name] is here to discuss his progress with his obesity treatment plan along with follow-up of his obesity related diagnoses. [name] is on keeping a food journal and adhering to recommended goals of 1200 calories and 85 grams of protein daily and states he is following his eating plan approximately 65% of the time. [name] states he is walking for 60 minutes 2 times per week.  Today's visit was #: 16 Starting weight: 223 lbs Starting date: 08/15/2019 Today's weight: 189 lbs Today's date: 09/29/2020 Total lbs lost to date: 34 Total lbs lost since last in-office visit: 0  Interim History: [name] has done well maintaining his weight. He has had some medications adjusted and he is still working on getting his new routine in place.  Subjective:   1. Vitamin D deficiency [name] is stable on Vit D, and he denies nausea or vomiting.  Assessment/Plan:   1. Vitamin D deficiency Low Vitamin D level contributes to fatigue and are associated with obesity, breast, and colon cancer. We will refill prescription Vitamin D for 1 month. [name] will follow-up for routine testing of Vitamin D, at least 2-3 times per year to avoid over-replacement.  - Vitamin D, Ergocalciferol, (DRISDOL) 1.25 MG (50000 UNIT) CAPS capsule; Take 1 capsule (50,000 Units total) by mouth once a week.  Dispense: 4 capsule; Refill: 0  2. Obesity with current BMI 28 [name] is currently in the action stage of change. As such, his goal is to continue with weight loss efforts. He has agreed to keeping a food journal and adhering to recommended goals of 1200-1600 calories and 85+ grams of protein daily.   Exercise goals: As is.  Behavioral modification strategies: increasing water intake.  [name] has agreed to follow-up with our clinic in 4 weeks. He was informed of the importance of frequent follow-up visits to maximize his success with intensive lifestyle modifications for his multiple health conditions.    Objective:   Blood pressure (!) 108/53, pulse 67, temperature (!) 97.5 F (36.4 C), height 5\' 9"  (1.753 m), weight 189 lb (85.7 kg), SpO2 97 %. Body mass index is 27.91 kg/m.  General: Cooperative, alert, well developed, in no acute distress. HEENT: Conjunctivae and lids unremarkable. Cardiovascular: Regular rhythm.  Lungs: Normal work of breathing. Neurologic: No focal deficits.   Lab Results  Component Value Date   CREATININE 1.17 06/25/2020   BUN 22 06/25/2020   NA 144 06/25/2020   K 4.3 06/25/2020   CL 101 06/25/2020   CO2 25 06/25/2020   Lab Results  Component Value Date   ALT 30 06/25/2020   AST 25 06/25/2020   ALKPHOS 71 06/25/2020   BILITOT 0.5 06/25/2020   Lab Results  Component Value Date   HGBA1C 5.7 (H) 06/25/2020   HGBA1C 5.5 02/12/2020   HGBA1C 5.9 (H) 11/13/2019   HGBA1C 6.1 (H) 08/15/2019   HGBA1C 6.1 11/28/2018   Lab Results  Component Value Date   INSULIN 6.3 06/25/2020   INSULIN 7.6 02/12/2020   INSULIN 10.2 11/13/2019   INSULIN 10.0 08/15/2019   Lab Results  Component Value Date   TSH 1.370 08/15/2019   Lab Results  Component Value Date   CHOL 136 06/25/2020   HDL 53 06/25/2020   LDLCALC 65 06/25/2020   TRIG 98 06/25/2020   CHOLHDL 2.6 06/25/2020   Lab Results  Component Value Date   WBC 4.7 10/31/2019   HGB 15.9 10/31/2019   HCT 48.5 10/31/2019   MCV  84 10/31/2019   PLT 132 (L) 10/31/2019   No results found for: IRON, TIBC, FERRITIN  Obesity Behavioral Intervention:   Approximately 15 minutes were spent on the discussion below.  ASK: We discussed the diagnosis of obesity with [name] today and [name] agreed to give [place] permission to discuss obesity behavioral modification therapy today.  ASSESS: [name] has the diagnosis of obesity and his BMI today is 27.9. [name] is in the action stage of change.   ADVISE: [name] was educated on the multiple health risks of obesity as well as the benefit of weight loss to improve his health.  He was advised of the need for long term treatment and the importance of lifestyle modifications to improve his current health and to decrease his risk of future health problems.  AGREE: Multiple dietary modification options and treatment options were discussed and [name] agreed to follow the recommendations documented in the above note.  ARRANGE: Orlyn was educated on the importance of frequent visits to treat obesity as outlined per CMS and USPSTF guidelines and agreed to schedule his next follow up appointment today.  Attestation Statements:   Reviewed by clinician on day of visit: allergies, medications, problem list, medical history, surgical history, family history, social history, and previous encounter notes.   I, Burt Knack, am acting as transcriptionist for Quillian Quince, MD.  I have reviewed the above documentation for accuracy and completeness, and I agree with the above. -  Quillian Quince, MD

## 2020-10-28 ENCOUNTER — Encounter (INDEPENDENT_AMBULATORY_CARE_PROVIDER_SITE_OTHER): Payer: Self-pay | Admitting: Family Medicine

## 2020-10-28 ENCOUNTER — Other Ambulatory Visit: Payer: Self-pay

## 2020-10-28 ENCOUNTER — Telehealth: Payer: Self-pay | Admitting: Family

## 2020-10-28 ENCOUNTER — Ambulatory Visit (INDEPENDENT_AMBULATORY_CARE_PROVIDER_SITE_OTHER): Payer: Medicare Other | Admitting: Family Medicine

## 2020-10-28 ENCOUNTER — Ambulatory Visit (INDEPENDENT_AMBULATORY_CARE_PROVIDER_SITE_OTHER): Payer: Medicare Other | Admitting: Family

## 2020-10-28 VITALS — BP 107/70 | HR 74 | Temp 97.9°F | Ht 69.0 in | Wt 188.0 lb

## 2020-10-28 VITALS — BP 118/78 | HR 89 | Temp 97.8°F | Resp 15 | Ht 69.0 in | Wt 193.0 lb

## 2020-10-28 DIAGNOSIS — E669 Obesity, unspecified: Secondary | ICD-10-CM | POA: Diagnosis not present

## 2020-10-28 DIAGNOSIS — E559 Vitamin D deficiency, unspecified: Secondary | ICD-10-CM | POA: Diagnosis not present

## 2020-10-28 DIAGNOSIS — R739 Hyperglycemia, unspecified: Secondary | ICD-10-CM

## 2020-10-28 DIAGNOSIS — Z6832 Body mass index (BMI) 32.0-32.9, adult: Secondary | ICD-10-CM

## 2020-10-28 DIAGNOSIS — I10 Essential (primary) hypertension: Secondary | ICD-10-CM

## 2020-10-28 DIAGNOSIS — E785 Hyperlipidemia, unspecified: Secondary | ICD-10-CM | POA: Diagnosis not present

## 2020-10-28 DIAGNOSIS — Z1211 Encounter for screening for malignant neoplasm of colon: Secondary | ICD-10-CM

## 2020-10-28 DIAGNOSIS — I5022 Chronic systolic (congestive) heart failure: Secondary | ICD-10-CM

## 2020-10-28 MED ORDER — VITAMIN D (ERGOCALCIFEROL) 1.25 MG (50000 UNIT) PO CAPS: 50000.0000 [IU] | ORAL_CAPSULE | ORAL | 0 refills | Status: DC

## 2020-10-28 NOTE — Telephone Encounter (Deleted)
Please initiate follow up Cologuard testing.

## 2020-10-28 NOTE — Assessment & Plan Note (Signed)
Appears euvolemic on furosemide 20mg  once daily.  I sent a note to both cardiologists letting them know about pt's transfer wishes.

## 2020-10-28 NOTE — Assessment & Plan Note (Signed)
Will confirm date of last colonoscopy and determine if cologuard needs to be repeated. He received a recall letter from [facility].

## 2020-10-28 NOTE — Assessment & Plan Note (Signed)
ldl at goal.  Continue atorvastatin 10mg  once daily.

## 2020-10-28 NOTE — Progress Notes (Signed)
Subjective:     Patient ID: [name], male    DOB: 1945-04-21, 75 y.o.   MRN: [id number]  Chief Complaint  Patient presents with   Hypertension    3 month follow up   Referral    Discuss closer cardiologist to home    Hypertension  Patient is in today for follow up.  HTN- maintained on coreg 6.25mg  bid.  BP Readings from Last 3 Encounters:  10/28/20 118/78  09/29/20 (!) 108/53  08/29/20 120/79   Lab Results  Component Value Date   CHOL 136 06/25/2020   HDL 53 06/25/2020   LDLCALC 65 06/25/2020   TRIG 98 06/25/2020   CHOLHDL 2.6 06/25/2020   CHF- denies LE edema.   Wt Readings from Last 3 Encounters:  10/28/20 193 lb (87.5 kg)  09/29/20 189 lb (85.7 kg)  08/29/20 193 lb (87.5 kg)   He is now stable on furosemide 20mg  once daily. He is interested in switching his cardiology care to the [facility].  Health Maintenance Due  Topic Date Due   TETANUS/TDAP  01/28/2018    Past Medical History:  Diagnosis Date   Alcohol abuse    Anomalous right coronary artery    Chronic systolic CHF (congestive heart failure) ([facility]) 08/18/2016   Echo 1/17:  Diff HK especially in inf wall, mild LVH, EF 30-35, mild AS (mean 10, peak 18) // Echo 11/15: Mild LVH, EF 30-35, moderate HK, Gr 1 DD, mild AI, mild RAE // Echo 8/18: Mild LVH, EF 30-35, diffuse HK, mild aortic stenosis (mean 7), mild AI, mild BAE   Coronary artery calcification 08/18/2016   Cor CTA 1/08: Ca score 248 // LHC 1/08: oLAD 25%   History of ETOH abuse    Hyperlipidemia 08/27/2013   Hypertension    Lactose intolerance    Non-ischemic cardiomyopathy ([facility])    Presumed secondary to Etoh and HTN  (Prev EF 20-25% -- improved to 50%) // LHC 1/08: oLAD 25, EF 25%    OSA (obstructive sleep apnea) 10/17/2013   Severe per home sleep study performed on 09/26/13.    Sleep apnea    SOB (shortness of breath)    Vitamin D deficiency     No past surgical history on file.  Family History  Problem Relation Age of  Onset   Alcohol abuse Mother    Heart disease Mother    High blood pressure Mother    High Cholesterol Mother    Sudden death Mother    Alcohol abuse Father    Heart failure Father    Heart disease Maternal Grandmother    Stroke Maternal Grandmother    Diabetes Neg Hx     Social History   Socioeconomic History   Marital status: Married    Spouse name: 09/28/13   Number of children: 3   Years of education: Not on file   Highest education level: Not on file  Occupational History   Occupation: Retired [name]  Tobacco Use   Smoking status: Former    Years: 20.00    Types: Cigarettes    Quit date: 04/12/2002    Years since quitting: 18.5   Smokeless tobacco: Never  Vaping Use   Vaping Use: Never used  Substance and Sexual Activity   Alcohol use: Not Currently    Alcohol/week: 0.0 standard drinks   Drug use: No    Comment: history of marijuana use   Sexual activity: Yes  Other Topics Concern   Not on  file  Social History Narrative   2 sons and daughter   1 son in [name]. Living with wife 64yrs [name]).  Use to live near [place].   Social Determinants of Health   Financial Resource Strain: Not on file  Food Insecurity: Not on file  Transportation Needs: Not on file  Physical Activity: Not on file  Stress: Not on file  Social Connections: Not on file  Intimate Partner Violence: Not on file    Outpatient Medications Prior to Visit  Medication Sig Dispense Refill   aspirin EC 81 MG tablet Take 1 tablet (81 mg total) by mouth daily. 90 tablet 3   atorvastatin (LIPITOR) 10 MG tablet TAKE 1 TABLET BY MOUTH ONCE DAILY AT  6  00  PM 90 tablet 0   carvedilol (COREG) 6.25 MG tablet Take 1 tablet (6.25 mg total) by mouth 2 (two) times daily with a meal. 60 tablet 5   COVID-19 mRNA Vac-TriS, Pfizer, (PFIZER-BIONT COVID-19 VAC-TRIS) SUSP injection Inject into the muscle. 0.3 mL 0   furosemide (LASIX) 20 MG tablet Take 20 mg by mouth. 1.5 tab additional on Wednesday and Sunday      Multiple Vitamins-Minerals (PRESERVISION AREDS 2+MULTI VIT PO) Take 1 tablet by mouth daily.     potassium chloride (KLOR-CON) 10 MEQ tablet TAKE 1  BY MOUTH ONCE DAILY 90 tablet 0   sildenafil (REVATIO) 20 MG tablet Take 1 tablet (20 mg total) by mouth 3 (three) times daily. 30 tablet 2   spironolactone (ALDACTONE) 25 MG tablet Take 1/2 (one-half) tablet by mouth once daily 45 tablet 2   Vitamin D, Ergocalciferol, (DRISDOL) 1.25 MG (50000 UNIT) CAPS capsule Take 1 capsule (50,000 Units total) by mouth once a week. 4 capsule 0   furosemide (LASIX) 40 MG tablet Take 0.5 tablets (20 mg total) by mouth daily. Except Sundays and Wednesdays take ONE and HALF tablets 105 tablet 3   No facility-administered medications prior to visit.    Allergies  Allergen Reactions   Lisinopril Swelling    ANGIOEDEMA    ROS  See HPI    Objective:    Physical Exam Constitutional:      General: He is not in acute distress.    Appearance: He is well-developed.  HENT:     Head: Normocephalic and atraumatic.  Cardiovascular:     Rate and Rhythm: Normal rate and regular rhythm.     Heart sounds: No murmur heard. Pulmonary:     Effort: Pulmonary effort is normal. No respiratory distress.     Breath sounds: Normal breath sounds. No wheezing or rales.  Skin:    General: Skin is warm and dry.  Neurological:     Mental Status: He is alert and oriented to person, place, and time.  Psychiatric:        Behavior: Behavior normal.        Thought Content: Thought content normal.    BP 118/78 (BP Location: Right Arm, Patient Position: Sitting, Cuff Size: Normal)   Pulse 89   Temp 97.8 F (36.6 C)   Resp 15   Ht 5\' 9"  (1.753 m)   Wt 193 lb (87.5 kg)   SpO2 98%   BMI 28.50 kg/m  Wt Readings from Last 3 Encounters:  10/28/20 193 lb (87.5 kg)  09/29/20 189 lb (85.7 kg)  08/29/20 193 lb (87.5 kg)       Assessment & Plan:   Problem List Items Addressed This Visit  Unprioritized    Hyperglycemia    ldl at goal.  Continue atorvastatin 10mg  once daily.        Essential hypertension    BP is stable on coreg 6.125 bid.        Relevant Medications   furosemide (LASIX) 20 MG tablet   Colon cancer screening - Primary    Will confirm date of last colonoscopy and determine if cologuard needs to be repeated. He received a recall letter from [facility].       Chronic systolic CHF (congestive heart failure) ([facility])    Appears euvolemic on furosemide 20mg  once daily.  I sent a note to both cardiologists letting them know about pt's transfer wishes.        Relevant Medications   furosemide (LASIX) 20 MG tablet    I am having Rakeem H. Dunshee maintain his aspirin EC, Multiple Vitamins-Minerals (PRESERVISION AREDS 2+MULTI VIT PO), sildenafil, carvedilol, Pfizer-BioNT COVID-19 Vac-TriS, spironolactone, potassium chloride, atorvastatin, Vitamin D (Ergocalciferol), and furosemide.  No orders of the defined types were placed in this encounter.

## 2020-10-28 NOTE — Assessment & Plan Note (Signed)
BP is stable on coreg 6.125 bid.

## 2020-10-29 NOTE — Telephone Encounter (Signed)
Please contact pt re: unread mychart message. 

## 2020-10-29 NOTE — Telephone Encounter (Signed)
Patient states he got a cologuard in 2019 and he also received a letter from [facility] stating it was time to do it again

## 2020-10-30 NOTE — Telephone Encounter (Signed)
Order placed

## 2020-10-30 NOTE — Telephone Encounter (Signed)
OK, let's please initiate a follow up cologuard test.

## 2020-11-04 ENCOUNTER — Telehealth: Payer: Self-pay | Admitting: Internal Medicine

## 2020-11-04 NOTE — Telephone Encounter (Signed)
Pt is wanting to do a provider switch from Dr. Tenny Craw to Dr. Bjorn Pippin in order to have a shorter drive. Please advise.

## 2020-11-04 NOTE — Progress Notes (Signed)
Chief Complaint:   OBESITY [name] is here to discuss his progress with his obesity treatment plan along with follow-up of his obesity related diagnoses. [name] is on keeping a food journal and adhering to recommended goals of 1200-1600 calories and 85+ grams of protein daily and states he is following his eating plan approximately 70% of the time. [name] states he is doing 0 minutes 0 times per week.  Today's visit was #: 17 Starting weight: 223 lbs Starting date: 08/15/2019 Today's weight: 188 lbs Today's date: 10/28/2020 Total lbs lost to date: 35 Total lbs lost since last in-office visit: 1  Interim History: Temple continues to work on diet and weight loss. He has had less exercise recently. His hunger is controlled and he is happy with his journaling plan.  Subjective:   1. Vitamin D deficiency [name] is stable on Vit D, and he denies nausea or vomiting.  2. Hyperlipidemia, unspecified hyperlipidemia type [name] is working on diet and exercise, and he is due for labs soon.  Assessment/Plan:   1. Vitamin D deficiency Low Vitamin D level contributes to fatigue and are associated with obesity, breast, and colon cancer. We will refill prescription Vitamin D for 1 month, and we will recheck labs next month. [name] will follow-up for routine testing of Vitamin D, at least 2-3 times per year to avoid over-replacement.  - Vitamin D, Ergocalciferol, (DRISDOL) 1.25 MG (50000 UNIT) CAPS capsule; Take 1 capsule (50,000 Units total) by mouth once a week.  Dispense: 4 capsule; Refill: 0  2. Hyperlipidemia, unspecified hyperlipidemia type Cardiovascular risk and specific lipid/LDL goals reviewed. We discussed several lifestyle modifications today. [name] will continue to work on diet, exercise and weight loss efforts. We will recheck labs next month. Orders and follow up as documented in patient record.   3. Obesity with current BMI 27.8 [name] is currently in the action stage of change. As such, his goal  is to continue with weight loss efforts. He has agreed to keeping a food journal and adhering to recommended goals of 1200-1600 calories and 85+ grams of protein daily.   Exercise goals: For substantial health benefits, adults should do at least 150 minutes (2 hours and 30 minutes) a week of moderate-intensity, or 75 minutes (1 hour and 15 minutes) a week of vigorous-intensity aerobic physical activity, or an equivalent combination of moderate- and vigorous-intensity aerobic activity. Aerobic activity should be performed in episodes of at least 10 minutes, and preferably, it should be spread throughout the week.  Behavioral modification strategies: increasing lean protein intake and increasing vegetables.  [name] has agreed to follow-up with our clinic in 4 weeks. He was informed of the importance of frequent follow-up visits to maximize his success with intensive lifestyle modifications for his multiple health conditions.   Objective:   Blood pressure 107/70, pulse 74, temperature 97.9 F (36.6 C), height 5\' 9"  (1.753 m), weight 188 lb (85.3 kg), SpO2 97 %. Body mass index is 27.76 kg/m.  General: Cooperative, alert, well developed, in no acute distress. HEENT: Conjunctivae and lids unremarkable. Cardiovascular: Regular rhythm.  Lungs: Normal work of breathing. Neurologic: No focal deficits.   Lab Results  Component Value Date   CREATININE 1.17 06/25/2020   BUN 22 06/25/2020   NA 144 06/25/2020   K 4.3 06/25/2020   CL 101 06/25/2020   CO2 25 06/25/2020   Lab Results  Component Value Date   ALT 30 06/25/2020   AST 25 06/25/2020   ALKPHOS 71 06/25/2020  BILITOT 0.5 06/25/2020   Lab Results  Component Value Date   HGBA1C 5.7 (H) 06/25/2020   HGBA1C 5.5 02/12/2020   HGBA1C 5.9 (H) 11/13/2019   HGBA1C 6.1 (H) 08/15/2019   HGBA1C 6.1 11/28/2018   Lab Results  Component Value Date   INSULIN 6.3 06/25/2020   INSULIN 7.6 02/12/2020   INSULIN 10.2 11/13/2019   INSULIN 10.0  08/15/2019   Lab Results  Component Value Date   TSH 1.370 08/15/2019   Lab Results  Component Value Date   CHOL 136 06/25/2020   HDL 53 06/25/2020   LDLCALC 65 06/25/2020   TRIG 98 06/25/2020   CHOLHDL 2.6 06/25/2020   Lab Results  Component Value Date   VD25OH 53.4 06/25/2020   VD25OH 63.0 02/12/2020   VD25OH 52.8 11/13/2019   Lab Results  Component Value Date   WBC 4.7 10/31/2019   HGB 15.9 10/31/2019   HCT 48.5 10/31/2019   MCV 84 10/31/2019   PLT 132 (L) 10/31/2019   No results found for: IRON, TIBC, FERRITIN  Attestation Statements:   Reviewed by clinician on day of visit: allergies, medications, problem list, medical history, surgical history, family history, social history, and previous encounter notes.  Time spent on visit including pre-visit chart review and post-visit care and charting was 45 minutes.    I, [name], am acting as transcriptionist for Alan Quince, MD.  I have reviewed the above documentation for accuracy and completeness, and I agree with the above. -  Alan Quince, MD

## 2020-11-05 NOTE — Telephone Encounter (Signed)
OK 

## 2020-11-12 ENCOUNTER — Ambulatory Visit (INDEPENDENT_AMBULATORY_CARE_PROVIDER_SITE_OTHER): Payer: Medicare Other

## 2020-11-12 VITALS — Ht 69.0 in | Wt 188.0 lb

## 2020-11-12 DIAGNOSIS — Z0000 Encounter for general adult medical examination without abnormal findings: Secondary | ICD-10-CM | POA: Diagnosis not present

## 2020-11-12 NOTE — Progress Notes (Signed)
Subjective:   [name] is a 75 y.o. male who presents for Medicare Annual/Subsequent preventive examination.  I connected with [name] today by telephone and verified that I am speaking with the correct person using two identifiers. Location patient: home Location provider: work Persons participating in the virtual visit: patient, Engineer, civil (consulting).    I discussed the limitations, risks, security and privacy concerns of performing an evaluation and management service by telephone and the availability of in person appointments. I also discussed with the patient that there may be a patient responsible charge related to this service. The patient expressed understanding and verbally consented to this telephonic visit.    Interactive audio and video telecommunications were attempted between this provider and patient, however failed, due to patient having technical difficulties OR patient did not have access to video capability.  We continued and completed visit with audio only.  Some vital signs may be absent or patient reported.   Time Spent with patient on telephone encounter: 20 minutes   Review of Systems     Cardiac Risk Factors include: advanced age (>47men, >67 women);male gender;dyslipidemia;hypertension     Objective:    Today's Vitals   11/12/20 1300  Weight: 188 lb (85.3 kg)  Height: 5\' 9"  (1.753 m)   Body mass index is 27.76 kg/m.  Advanced Directives 11/12/2020 10/24/2019 12/15/2018 08/29/2018 08/23/2017 08/11/2016 04/25/2016  Does Patient Have a Medical Advance Directive? No No No No No No No  Does patient want to make changes to medical advance directive? - - - - - Yes (MAU/Ambulatory/Procedural Areas - Information given) -  Would patient like information on creating a medical advance directive? Yes (MAU/Ambulatory/Procedural Areas - Information given) No - Patient declined No - Patient declined No - Patient declined No - Patient declined - -    Current Medications  (verified) Outpatient Encounter Medications as of 11/12/2020  Medication Sig   aspirin EC 81 MG tablet Take 1 tablet (81 mg total) by mouth daily.   atorvastatin (LIPITOR) 10 MG tablet TAKE 1 TABLET BY MOUTH ONCE DAILY AT  6  00  PM   carvedilol (COREG) 6.25 MG tablet Take 1 tablet (6.25 mg total) by mouth 2 (two) times daily with a meal.   furosemide (LASIX) 20 MG tablet Take 20 mg by mouth. 1.5 tab additional on Wednesday and Sunday   Multiple Vitamins-Minerals (PRESERVISION AREDS 2+MULTI VIT PO) Take 1 tablet by mouth daily.   potassium chloride (KLOR-CON) 10 MEQ tablet TAKE 1  BY MOUTH ONCE DAILY   sildenafil (REVATIO) 20 MG tablet Take 1 tablet (20 mg total) by mouth 3 (three) times daily.   spironolactone (ALDACTONE) 25 MG tablet Take 1/2 (one-half) tablet by mouth once daily   Vitamin D, Ergocalciferol, (DRISDOL) 1.25 MG (50000 UNIT) CAPS capsule Take 1 capsule (50,000 Units total) by mouth once a week.   [DISCONTINUED] COVID-19 mRNA Vac-TriS, Pfizer, (PFIZER-BIONT COVID-19 VAC-TRIS) SUSP injection Inject into the muscle.   No facility-administered encounter medications on file as of 11/12/2020.    Allergies (verified) Lisinopril   History: Past Medical History:  Diagnosis Date   Alcohol abuse    Anomalous right coronary artery    Chronic systolic CHF (congestive heart failure) (HCC) 08/18/2016   Echo 1/17:  Diff HK especially in inf wall, mild LVH, EF 30-35, mild AS (mean 10, peak 18) // Echo 11/15: Mild LVH, EF 30-35, moderate HK, Gr 1 DD, mild AI, mild RAE // Echo 8/18: Mild LVH, EF 30-35, diffuse HK,  mild aortic stenosis (mean 7), mild AI, mild BAE   Coronary artery calcification 08/18/2016   Cor CTA 1/08: Ca score 248 // LHC 1/08: oLAD 25%   History of ETOH abuse    Hyperlipidemia 08/27/2013   Hypertension    Lactose intolerance    Non-ischemic cardiomyopathy (HCC)    Presumed secondary to Etoh and HTN  (Prev EF 20-25% -- improved to 50%) // LHC 1/08: oLAD 25, EF 25%    OSA  (obstructive sleep apnea) 10/17/2013   Severe per home sleep study performed on 09/26/13.    Sleep apnea    SOB (shortness of breath)    Vitamin D deficiency    No past surgical history on file. Family History  Problem Relation Age of Onset   Alcohol abuse Mother    Heart disease Mother    High blood pressure Mother    High Cholesterol Mother    Sudden death Mother    Alcohol abuse Father    Heart failure Father    Heart disease Maternal Grandmother    Stroke Maternal Grandmother    Diabetes Neg Hx    Social History   Socioeconomic History   Marital status: Married    Spouse name: [name]   Number of children: 3   Years of education: Not on file   Highest education level: Not on file  Occupational History   Occupation: Retired Radiographer, therapeutic  Tobacco Use   Smoking status: Former    Years: 20.00    Types: Cigarettes    Quit date: 04/12/2002    Years since quitting: 18.6   Smokeless tobacco: Never  Vaping Use   Vaping Use: Never used  Substance and Sexual Activity   Alcohol use: Not Currently    Alcohol/week: 0.0 standard drinks   Drug use: No    Comment: history of marijuana use   Sexual activity: Yes  Other Topics Concern   Not on file  Social History Narrative   2 sons and daughter   1 son in Hotel manager. Living with wife 45yrs [name]).  Use to live near [place].   Social Determinants of Health   Financial Resource Strain: Not on file  Food Insecurity: Not on file  Transportation Needs: No Transportation Needs   Lack of Transportation (Medical): No   Lack of Transportation (Non-Medical): No  Physical Activity: Inactive   Days of Exercise per Week: 0 days   Minutes of Exercise per Session: 0 min  Stress: Not on file  Social Connections: Moderately Isolated   Frequency of Communication with Friends and Family: More than three times a week   Frequency of Social Gatherings with Friends and Family: More than three times a week   Attends Religious Services: Never   Automotive engineer or Organizations: No   Attends Engineer, structural: Never   Marital Status: Married    Tobacco Counseling Counseling given: Not Answered   Clinical Intake:  Pre-visit preparation completed: Yes  Pain : No/denies pain     Nutritional Status: BMI 25 -29 Overweight Nutritional Risks: None Diabetes: No  How often do you need to have someone help you when you read instructions, pamphlets, or other written materials from your doctor or pharmacy?: 1 - Never  Diabetic?No  Interpreter Needed?: No  Information entered by :: Thomasenia Sales LPN   Activities of Daily Living In your present state of health, do you have any difficulty performing the following activities: 11/12/2020  Hearing? N  Vision? N  Difficulty concentrating or making decisions? N  Walking or climbing stairs? N  Dressing or bathing? N  Doing errands, shopping? N  Preparing Food and eating ? N  Using the Toilet? N  In the past six months, have you accidently leaked urine? N  Do you have problems with loss of bowel control? N  Managing your Medications? N  Managing your Finances? N  Housekeeping or managing your Housekeeping? N  Some recent data might be hidden    Patient Care Team: Sandford Craze, NP as PCP - General (Internal Medicine) Pricilla Riffle, MD as PCP - Cardiology (Cardiology) Barron Alvine, MD (Inactive) as Consulting Physician (Urology)  Indicate any recent Medical Services you may have received from other than [facility] providers in the past year (date may be approximate).     Assessment:   This is a routine wellness examination for [facility].  Hearing/Vision screen Hearing Screening - Comments:: No issues Vision Screening - Comments:: Wears glasses Last eye exam-05/2020-[facility]  Dietary issues and exercise activities discussed: Current Exercise Habits: The patient does not participate in regular exercise at present, Exercise limited by: None  identified   Goals Addressed             This Visit's Progress    Maintain current health.   On track    Patient Stated       Reduce salt intake       Depression Screen PHQ 2/9 Scores 11/12/2020 08/29/2020 10/24/2019 08/15/2019 08/29/2018 08/23/2017 08/11/2016  PHQ - 2 Score 0 0 0 2 0 0 0  PHQ- 9 Score - - - 4 - - -  Exception Documentation - - - Medical reason - - -    Fall Risk Fall Risk  11/12/2020 08/29/2020 10/24/2019 08/29/2018 08/23/2017  Falls in the past year? 0 0 0 0 No  Number falls in past yr: 0 0 0 - -  Injury with Fall? 0 0 0 - -  Risk for fall due to : - No Fall Risks - - Other (Comment)  Follow up Falls prevention discussed - Education provided;Falls prevention discussed - -    FALL RISK PREVENTION PERTAINING TO THE HOME:  Any stairs in or around the home? No  Home free of loose throw rugs in walkways, pet beds, electrical cords, etc? Yes  Adequate lighting in your home to reduce risk of falls? Yes   ASSISTIVE DEVICES UTILIZED TO PREVENT FALLS:  Life alert? No  Use of a cane, walker or w/c? No  Grab bars in the bathroom? Yes  Shower chair or bench in shower? No  Elevated toilet seat or a handicapped toilet? No   TIMED UP AND GO:  Was the test performed? No . Phone visit   Cognitive Function:Normal cognitive status assessed by  this Nurse Health Advisor. No abnormalities found.   MMSE - Mini Mental State Exam 06/20/2015  Orientation to time 5  Orientation to Place 5  Registration 3  Attention/ Calculation 5  Recall 3  Language- name 2 objects 2  Language- repeat 1  Language- follow 3 step command 3  Language- read & follow direction 1  Write a sentence 1  Copy design 1  Total score 30        Immunizations Immunization History  Administered Date(s) Administered   Fluad Quad(high Dose 65+) 12/20/2018, 12/21/2019   Influenza Split 02/23/2011, 01/13/2012   Influenza Whole 01/19/2008, 01/20/2009, 12/18/2009   Influenza, High Dose Seasonal PF  02/02/2016, 02/02/2017, 12/11/2017, 01/10/2018  Influenza,inj,Quad PF,6+ Mos 01/03/2013, 01/21/2014, 02/05/2015   PFIZER Comirnaty(Gray Top)Covid-19 Tri-Sucrose Vaccine 07/29/2020   PFIZER(Purple Top)SARS-COV-2 Vaccination 05/25/2019, 06/17/2019, 01/08/2020   Pneumococcal Conjugate-13 01/21/2014, 02/05/2015   Pneumococcal Polysaccharide-23 01/19/2008, 02/02/2016   Td 01/29/2008   Zoster Recombinat (Shingrix) 12/07/2018, 02/13/2019   Zoster, Live 08/24/2011    TDAP status: Due, Education has been provided regarding the importance of this vaccine. Advised may receive this vaccine at local pharmacy or Health Dept. Aware to provide a copy of the vaccination record if obtained from local pharmacy or Health Dept. Verbalized acceptance and understanding.  Flu Vaccine status: Up to date  Pneumococcal vaccine status: Up to date  Covid-19 vaccine status: Completed vaccines  Qualifies for Shingles Vaccine? No   Zostavax completed Yes   Shingrix Completed?: Yes  Screening Tests Health Maintenance  Topic Date Due   TETANUS/TDAP  01/28/2018   INFLUENZA VACCINE  11/10/2020   HEMOGLOBIN A1C  12/26/2020   COLONOSCOPY (Pts 45-108yrs Insurance coverage will need to be confirmed)  09/20/2027   COVID-19 Vaccine  Completed   Hepatitis C Screening  Completed   PNA vac Low Risk Adult  Completed   Zoster Vaccines- Shingrix  Completed   HPV VACCINES  Aged Out   FOOT EXAM  Discontinued   OPHTHALMOLOGY EXAM  Discontinued   URINE MICROALBUMIN  Discontinued    Health Maintenance  Health Maintenance Due  Topic Date Due   TETANUS/TDAP  01/28/2018   INFLUENZA VACCINE  11/10/2020    Colorectal cancer screening: Type of screening: Colonoscopy. Completed 09/19/2017. Repeat every 10 years  Lung Cancer Screening: (Low Dose CT Chest recommended if Age 23-80 years, 30 pack-year currently smoking OR have quit w/in 15years.) does not qualify.     Additional Screening:  Hepatitis C Screening:  Completed  06/20/2015  Vision Screening: Recommended annual ophthalmology exams for early detection of glaucoma and other disorders of the eye. Is the patient up to date with their annual eye exam?  Yes  Who is the provider or what is the name of the office in which the patient attends annual eye exams? [facility]   Dental Screening: Recommended annual dental exams for proper oral hygiene  Community Resource Referral / Chronic Care Management: CRR required this visit?  No   CCM required this visit?  No      Plan:     I have personally reviewed and noted the following in the patient's chart:   Medical and social history Use of alcohol, tobacco or illicit drugs  Current medications and supplements including opioid prescriptions. Patient is not currently taking opioid prescriptions. Functional ability and status Nutritional status Physical activity Advanced directives List of other physicians Hospitalizations, surgeries, and ER visits in previous 12 months Vitals Screenings to include cognitive, depression, and falls Referrals and appointments  In addition, I have reviewed and discussed with patient certain preventive protocols, quality metrics, and best practice recommendations. A written personalized care plan for preventive services as well as general preventive health recommendations were provided to patient.   Due to this being a telephonic visit, the after visit summary with patients personalized plan was offered to patient via mail or my-chart. Patient would like to access on my-chart.   Roanna Raider, LPN   10/19/238  Nurse Health Advisor  Nurse Notes: None

## 2020-11-12 NOTE — Patient Instructions (Signed)
[name] , Thank you for taking time to complete your Medicare Wellness Visit. I appreciate your ongoing commitment to your health goals. Please review the following plan we discussed and let me know if I can assist you in the future.   Screening recommendations/referrals: Colonoscopy:Per our conversation, you have Cologuard kit. Recommended yearly ophthalmology/optometry visit for glaucoma screening and checkup Recommended yearly dental visit for hygiene and checkup  Vaccinations: Influenza vaccine: Up to date Pneumococcal vaccine: Up to date Tdap vaccine: Discuss with pharmacy Shingles vaccine: Completed vaccines   Covid-19: Up to date  Advanced directives: Information mailed today  Conditions/risks identified: See problem list  Next appointment: Follow up in one year for your annual wellness visit. 11/17/2021 @ 1:00  Preventive Care 65 Years and Older, Male Preventive care refers to lifestyle choices and visits with your health care provider that can promote health and wellness. What does preventive care include? A yearly physical exam. This is also called an annual well check. Dental exams once or twice a year. Routine eye exams. Ask your health care provider how often you should have your eyes checked. Personal lifestyle choices, including: Daily care of your teeth and gums. Regular physical activity. Eating a healthy diet. Avoiding tobacco and drug use. Limiting alcohol use. Practicing safe sex. Taking low doses of aspirin every day. Taking vitamin and mineral supplements as recommended by your health care provider. What happens during an annual well check? The services and screenings done by your health care provider during your annual well check will depend on your age, overall health, lifestyle risk factors, and family history of disease. Counseling  Your health care provider may ask you questions about your: Alcohol use. Tobacco use. Drug use. Emotional  well-being. Home and relationship well-being. Sexual activity. Eating habits. History of falls. Memory and ability to understand (cognition). Work and work Statistician. Screening  You may have the following tests or measurements: Height, weight, and BMI. Blood pressure. Lipid and cholesterol levels. These may be checked every 5 years, or more frequently if you are over 4 years old. Skin check. Lung cancer screening. You may have this screening every year starting at age 71 if you have a 30-pack-year history of smoking and currently smoke or have quit within the past 15 years. Fecal occult blood test (FOBT) of the stool. You may have this test every year starting at age 54. Flexible sigmoidoscopy or colonoscopy. You may have a sigmoidoscopy every 5 years or a colonoscopy every 10 years starting at age 11. Prostate cancer screening. Recommendations will vary depending on your family history and other risks. Hepatitis C blood test. Hepatitis B blood test. Sexually transmitted disease (STD) testing. Diabetes screening. This is done by checking your blood sugar (glucose) after you have not eaten for a while (fasting). You may have this done every 1-3 years. Abdominal aortic aneurysm (AAA) screening. You may need this if you are a current or former smoker. Osteoporosis. You may be screened starting at age 30 if you are at high risk. Talk with your health care provider about your test results, treatment options, and if necessary, the need for more tests. Vaccines  Your health care provider may recommend certain vaccines, such as: Influenza vaccine. This is recommended every year. Tetanus, diphtheria, and acellular pertussis (Tdap, Td) vaccine. You may need a Td booster every 10 years. Zoster vaccine. You may need this after age 38. Pneumococcal 13-valent conjugate (PCV13) vaccine. One dose is recommended after age 39. Pneumococcal polysaccharide (PPSV23) vaccine.  One dose is recommended after  age 47. Talk to your health care provider about which screenings and vaccines you need and how often you need them. This information is not intended to replace advice given to you by your health care provider. Make sure you discuss any questions you have with your health care provider. Document Released: 04/25/2015 Document Revised: 12/17/2015 Document Reviewed: 01/28/2015 Elsevier Interactive Patient Education  2017 [place] Prevention in the Home Falls can cause injuries. They can happen to people of all ages. There are many things you can do to make your home safe and to help prevent falls. What can I do on the outside of my home? Regularly fix the edges of walkways and driveways and fix any cracks. Remove anything that might make you trip as you walk through a door, such as a raised step or threshold. Trim any bushes or trees on the path to your home. Use bright outdoor lighting. Clear any walking paths of anything that might make someone trip, such as rocks or tools. Regularly check to see if handrails are loose or broken. Make sure that both sides of any steps have handrails. Any raised decks and porches should have guardrails on the edges. Have any leaves, snow, or ice cleared regularly. Use sand or salt on walking paths during winter. Clean up any spills in your garage right away. This includes oil or grease spills. What can I do in the bathroom? Use night lights. Install grab bars by the toilet and in the tub and shower. Do not use towel bars as grab bars. Use non-skid mats or decals in the tub or shower. If you need to sit down in the shower, use a plastic, non-slip stool. Keep the floor dry. Clean up any water that spills on the floor as soon as it happens. Remove soap buildup in the tub or shower regularly. Attach bath mats securely with double-sided non-slip rug tape. Do not have throw rugs and other things on the floor that can make you trip. What can I do in the  bedroom? Use night lights. Make sure that you have a light by your bed that is easy to reach. Do not use any sheets or blankets that are too big for your bed. They should not hang down onto the floor. Have a firm chair that has side arms. You can use this for support while you get dressed. Do not have throw rugs and other things on the floor that can make you trip. What can I do in the kitchen? Clean up any spills right away. Avoid walking on wet floors. Keep items that you use a lot in easy-to-reach places. If you need to reach something above you, use a strong step stool that has a grab bar. Keep electrical cords out of the way. Do not use floor polish or wax that makes floors slippery. If you must use wax, use non-skid floor wax. Do not have throw rugs and other things on the floor that can make you trip. What can I do with my stairs? Do not leave any items on the stairs. Make sure that there are handrails on both sides of the stairs and use them. Fix handrails that are broken or loose. Make sure that handrails are as long as the stairways. Check any carpeting to make sure that it is firmly attached to the stairs. Fix any carpet that is loose or worn. Avoid having throw rugs at the top or bottom of  the stairs. If you do have throw rugs, attach them to the floor with carpet tape. Make sure that you have a light switch at the top of the stairs and the bottom of the stairs. If you do not have them, ask someone to add them for you. What else can I do to help prevent falls? Wear shoes that: Do not have high heels. Have rubber bottoms. Are comfortable and fit you well. Are closed at the toe. Do not wear sandals. If you use a stepladder: Make sure that it is fully opened. Do not climb a closed stepladder. Make sure that both sides of the stepladder are locked into place. Ask someone to hold it for you, if possible. Clearly mark and make sure that you can see: Any grab bars or  handrails. First and last steps. Where the edge of each step is. Use tools that help you move around (mobility aids) if they are needed. These include: Canes. Walkers. Scooters. Crutches. Turn on the lights when you go into a dark area. Replace any light bulbs as soon as they burn out. Set up your furniture so you have a clear path. Avoid moving your furniture around. If any of your floors are uneven, fix them. If there are any pets around you, be aware of where they are. Review your medicines with your doctor. Some medicines can make you feel dizzy. This can increase your chance of falling. Ask your doctor what other things that you can do to help prevent falls. This information is not intended to replace advice given to you by your health care provider. Make sure you discuss any questions you have with your health care provider. Document Released: 01/23/2009 Document Revised: 09/04/2015 Document Reviewed: 05/03/2014 Elsevier Interactive Patient Education  2017 [employer].

## 2020-11-16 NOTE — Progress Notes (Signed)
Cardiology Office Note:    Date:  11/18/2020   ID:  [name], DOB 1945-07-24, MRN [id number]  PCP:  Sandford Craze, NP  Cardiologist:  Dietrich Pates, MD  Electrophysiologist:  None   Referring MD: Sandford Craze, NP   Chief Complaint  Patient presents with   Congestive Heart Failure     History of Present Illness:    [name] is a 75 y.o. male with a hx of nonischemic cardiomyopathy, hypertension, OSA, alcohol abuse, hyperlipidemia who presents for follow-up.  Previously followed with Dr. Tenny Craw.  Coronary CTA in 2008 showed anomalous RCA off the left main with scores between pulmonary artery and aorta, calcified plaque in proximal LAD causing less than 50% stenosis, calcium score to 48.  Echocardiogram 12/15/2018 showed EF 35 to 40%.  Since last clinic visit, he reports that he is doing well.  He has lost 30 pounds in the last 9 months.  He is walking 2 miles per day and he recently joined a gym.  He denies any exertional chest pain or dyspnea.  Denies any lightheadedness, syncope, palpitations, or lower extremity edema.  Drinks a glass of wine 2-3 times per month.  Wt Readings from Last 3 Encounters:  11/18/20 197 lb 6.4 oz (89.5 kg)  11/12/20 188 lb (85.3 kg)  10/28/20 188 lb (85.3 kg)     Past Medical History:  Diagnosis Date   Alcohol abuse    Anomalous right coronary artery    Chronic systolic CHF (congestive heart failure) (HCC) 08/18/2016   Echo 1/17:  Diff HK especially in inf wall, mild LVH, EF 30-35, mild AS (mean 10, peak 18) // Echo 11/15: Mild LVH, EF 30-35, moderate HK, Gr 1 DD, mild AI, mild RAE // Echo 8/18: Mild LVH, EF 30-35, diffuse HK, mild aortic stenosis (mean 7), mild AI, mild BAE   Coronary artery calcification 08/18/2016   Cor CTA 1/08: Ca score 248 // LHC 1/08: oLAD 25%   History of ETOH abuse    Hyperlipidemia 08/27/2013   Hypertension    Lactose intolerance    Non-ischemic cardiomyopathy (HCC)    Presumed secondary to Etoh and HTN   (Prev EF 20-25% -- improved to 50%) // LHC 1/08: oLAD 25, EF 25%    OSA (obstructive sleep apnea) 10/17/2013   Severe per home sleep study performed on 09/26/13.    Sleep apnea    SOB (shortness of breath)    Vitamin D deficiency     No past surgical history on file.  Current Medications: Current Meds  Medication Sig   aspirin EC 81 MG tablet Take 1 tablet (81 mg total) by mouth daily.   atorvastatin (LIPITOR) 10 MG tablet TAKE 1 TABLET BY MOUTH ONCE DAILY AT  6  00  PM   carvedilol (COREG) 6.25 MG tablet Take 1 tablet (6.25 mg total) by mouth 2 (two) times daily with a meal.   furosemide (LASIX) 20 MG tablet Take 20 mg by mouth. 1.5 tab additional on Wednesday and Sunday   Multiple Vitamins-Minerals (PRESERVISION AREDS 2+MULTI VIT PO) Take 1 tablet by mouth daily.   potassium chloride (KLOR-CON) 10 MEQ tablet TAKE 1  BY MOUTH ONCE DAILY   sildenafil (REVATIO) 20 MG tablet Take 1 tablet (20 mg total) by mouth 3 (three) times daily.   spironolactone (ALDACTONE) 25 MG tablet Take 1/2 (one-half) tablet by mouth once daily   Vitamin D, Ergocalciferol, (DRISDOL) 1.25 MG (50000 UNIT) CAPS capsule Take 1 capsule (50,000 Units total)  by mouth once a week.     Allergies:   Lisinopril   Social History   Socioeconomic History   Marital status: Married    Spouse name: [name]   Number of children: 3   Years of education: Not on file   Highest education level: Not on file  Occupational History   Occupation: Retired Radiographer, therapeutic  Tobacco Use   Smoking status: Former    Years: 20.00    Types: Cigarettes    Quit date: 04/12/2002    Years since quitting: 18.6   Smokeless tobacco: Never  Vaping Use   Vaping Use: Never used  Substance and Sexual Activity   Alcohol use: Not Currently    Alcohol/week: 0.0 standard drinks   Drug use: No    Comment: history of marijuana use   Sexual activity: Yes  Other Topics Concern   Not on file  Social History Narrative   2 sons and daughter   1 son in Hotel manager.  Living with wife 40yrs [name]).  Use to live near [place].   Social Determinants of Health   Financial Resource Strain: Not on file  Food Insecurity: Not on file  Transportation Needs: No Transportation Needs   Lack of Transportation (Medical): No   Lack of Transportation (Non-Medical): No  Physical Activity: Inactive   Days of Exercise per Week: 0 days   Minutes of Exercise per Session: 0 min  Stress: Not on file  Social Connections: Moderately Isolated   Frequency of Communication with Friends and Family: More than three times a week   Frequency of Social Gatherings with Friends and Family: More than three times a week   Attends Religious Services: Never   Database administrator or Organizations: No   Attends Engineer, structural: Never   Marital Status: Married     Family History: The patient's family history includes Alcohol abuse in his father and mother; Heart disease in his maternal grandmother and mother; Heart failure in his father; High Cholesterol in his mother; High blood pressure in his mother; Stroke in his maternal grandmother; Sudden death in his mother. There is no history of Diabetes.  ROS:   Please see the history of present illness.     All other systems reviewed and are negative.  EKGs/Labs/Other Studies Reviewed:    The following studies were reviewed today:   EKG:  EKG is ordered today.  The ekg ordered today demonstrates sinus rhythm, first-degree AV block, rate 98, nonspecific interventricular conduction delay, frequent PVCs  Recent Labs: 06/25/2020: ALT 30; BUN 22; Creatinine, Ser 1.17; Potassium 4.3; Sodium 144  Recent Lipid Panel    Component Value Date/Time   CHOL 136 06/25/2020 0842   TRIG 98 06/25/2020 0842   HDL 53 06/25/2020 0842   CHOLHDL 2.6 06/25/2020 0842   CHOLHDL 3 04/16/2019 0738   VLDL 28.0 04/16/2019 0738   LDLCALC 65 06/25/2020 0842    Physical Exam:    VS:  BP 134/86   Pulse 98   Ht 5\' 10"  (1.778 m)   Wt 197 lb  6.4 oz (89.5 kg)   SpO2 98%   BMI 28.32 kg/m     Wt Readings from Last 3 Encounters:  11/18/20 197 lb 6.4 oz (89.5 kg)  11/12/20 188 lb (85.3 kg)  10/28/20 188 lb (85.3 kg)     GEN:  Well nourished, well developed in no acute distress HEENT: Normal NECK: No JVD; No carotid bruits CARDIAC: Irregular, normal rate, no murmurs, rubs, gallops  RESPIRATORY:  Clear to auscultation without rales, wheezing or rhonchi  ABDOMEN: Soft, non-tender, non-distended MUSCULOSKELETAL:  No edema; No deformity  SKIN: Warm and dry NEUROLOGIC:  Alert and oriented x 3 PSYCHIATRIC:  Normal affect   ASSESSMENT:    1. Chronic combined systolic and diastolic heart failure (HCC)   2. Anomalous right coronary artery   3. Essential hypertension   4. Mixed hyperlipidemia   5. PVC's (premature ventricular contractions)    PLAN:    Chronic combined systolic and diastolic heart failure: Coronary CTA in 2008 showed anomalous RCA off the left main with scores between pulmonary artery and aorta, calcified plaque in proximal LAD causing less than 50% stenosis, calcium score to 48.  Echocardiogram 12/15/2018 showed EF 35 to 40%. -Developed angioedema with lisinopril -Continue carvedilol 6.25 mg twice daily -Continue spironolactone 12.5 mg daily -Continue Lasix.  Appears euvolemic.  Will check BMP/magnesium -Unclear etiology of cardiomyopathy.  Recommend exercise Myoview to evaluate for ischemia from anomalous RCA with interarterial course.  Repeat echocardiogram to evaluate systolic function.  Given frequent PVCs on EKG, will check Zio patch x3 days to quantify PVC burden.  Anomalous RCA off left main with interarterial course: Exercise Myoview as above to evaluate for ischemia.  Frequent PVCs: Noted on EKG in clinic, will check Zio patch x3 days to quantify PVC burden.  Hypertension: Continue carvedilol 6.25 mg twice daily and spironolactone 12.5 mg daily.  Appears controlled  Hyperlipidemia: Continue  atorvastatin 10 mg daily.  LDL 65 on 06/25/2020  RTC in 3 months  Shared Decision Making/Informed Consent The risks [chest pain, shortness of breath, cardiac arrhythmias, dizziness, blood pressure fluctuations, myocardial infarction, stroke/transient ischemic attack, nausea, vomiting, allergic reaction, radiation exposure, metallic taste sensation and life-threatening complications (estimated to be 1 in 10,000)], benefits (risk stratification, diagnosing coronary artery disease, treatment guidance) and alternatives of a nuclear stress test were discussed in detail with Mr. Kotarski and he agrees to proceed.   Medication Adjustments/Labs and Tests Ordered: Current medicines are reviewed at length with the patient today.  Concerns regarding medicines are outlined above.  Orders Placed This Encounter  Procedures   Basic metabolic panel   Magnesium   TSH   MYOCARDIAL PERFUSION IMAGING   LONG TERM MONITOR (3-14 DAYS)   EKG 12-Lead   ECHOCARDIOGRAM COMPLETE    No orders of the defined types were placed in this encounter.   There are no Patient Instructions on file for this visit.   Signed, Little Ishikawa, MD  11/18/2020 8:41 AM    [facility]

## 2020-11-18 ENCOUNTER — Ambulatory Visit (INDEPENDENT_AMBULATORY_CARE_PROVIDER_SITE_OTHER): Payer: Medicare Other | Admitting: Cardiology

## 2020-11-18 ENCOUNTER — Ambulatory Visit (INDEPENDENT_AMBULATORY_CARE_PROVIDER_SITE_OTHER): Payer: Medicare Other

## 2020-11-18 ENCOUNTER — Other Ambulatory Visit: Payer: Self-pay

## 2020-11-18 ENCOUNTER — Telehealth (HOSPITAL_BASED_OUTPATIENT_CLINIC_OR_DEPARTMENT_OTHER): Payer: Self-pay | Admitting: Cardiology

## 2020-11-18 VITALS — BP 134/86 | HR 98 | Ht 70.0 in | Wt 197.4 lb

## 2020-11-18 DIAGNOSIS — I5042 Chronic combined systolic (congestive) and diastolic (congestive) heart failure: Secondary | ICD-10-CM | POA: Diagnosis not present

## 2020-11-18 DIAGNOSIS — Q245 Malformation of coronary vessels: Secondary | ICD-10-CM

## 2020-11-18 DIAGNOSIS — I493 Ventricular premature depolarization: Secondary | ICD-10-CM

## 2020-11-18 DIAGNOSIS — I10 Essential (primary) hypertension: Secondary | ICD-10-CM

## 2020-11-18 DIAGNOSIS — E782 Mixed hyperlipidemia: Secondary | ICD-10-CM | POA: Diagnosis not present

## 2020-11-18 NOTE — Patient Instructions (Signed)
Medication Instructions:  Your physician recommends that you continue on your current medications as directed. Please refer to the Current Medication list given to you today.  *If you need a refill on your cardiac medications before your next appointment, please call your pharmacy*   Lab Work: BMET, Mag, TSH  If you have labs (blood work) drawn today and your tests are completely normal, you will receive your results only by: MyChart Message (if you have MyChart) OR A paper copy in the mail If you have any lab test that is abnormal or we need to change your treatment, we will call you to review the results.   Testing/Procedures: Your physician has requested that you have an exercise stress myoview (at [facility]). For further information please visit [email or web address] Please follow instruction sheet, as given.  Your physician has requested that you have an echocardiogram. Echocardiography is a painless test that uses sound waves to create images of your heart. It provides your doctor with information about the size and shape of your heart and how well your heart's chambers and valves are working. This procedure takes approximately one hour. There are no restrictions for this procedure.  ZIO XT- Long Term Monitor Instructions   Your physician has requested you wear a ZIO patch monitor for _3__ days.  This is a single patch monitor.   [employer] supplies one patch monitor per enrollment. Additional stickers are not available. Please do not apply patch if you will be having a Nuclear Stress Test, Echocardiogram, Cardiac CT, MRI, or Chest Xray during the period you would be wearing the monitor. The patch cannot be worn during these tests. You cannot remove and re-apply the ZIO XT patch monitor.  Your ZIO patch monitor will be sent Fed Ex from [employer] directly to your home address. It may take 3-5 days to receive your monitor after you have been enrolled.  Once you have received  your monitor, please review the enclosed instructions. Your monitor has already been registered assigning a specific monitor serial # to you.  Billing and Patient Assistance Program Information   We have supplied [employer] with any of your insurance information on file for billing purposes. [employer] offers a sliding scale Patient Assistance Program for patients that do not have insurance, or whose insurance does not completely cover the cost of the ZIO monitor.   You must apply for the Patient Assistance Program to qualify for this discounted rate.     To apply, please call [employer] at [phone], select option 4, then select option 2, and ask to apply for Patient Assistance Program.  Meredeth Ide will ask your household income, and how many people are in your household.  They will quote your out-of-pocket cost based on that information.  [employer] will also be able to set up a 62-month, interest-free payment plan if needed.  Applying the monitor   Shave hair from upper left chest.  Hold abrader disc by orange tab. Rub abrader in 40 strokes over the upper left chest as indicated in your monitor instructions.  Clean area with 4 enclosed alcohol pads. Let dry.  Apply patch as indicated in monitor instructions. Patch will be placed under collarbone on left side of chest with arrow pointing upward.  Rub patch adhesive wings for 2 minutes. Remove white label marked "1". Remove the white label marked "2". Rub patch adhesive wings for 2 additional minutes.  While looking in a mirror, press and release button in center of patch. A small green light  will flash 3-4 times. This will be your only indicator that the monitor has been turned on. ?  Do not shower for the first 24 hours. You may shower after the first 24 hours.  Press the button if you feel a symptom. You will hear a small click. Record Date, Time and Symptom in the Patient Logbook.  When you are ready to remove the patch, follow instructions on the last 2  pages of the Patient Logbook. Stick patch monitor onto the last page of Patient Logbook.  Place Patient Logbook in the blue and white box.  Use locking tab on box and tape box closed securely.  The blue and white box has prepaid postage on it. Please place it in the mailbox as soon as possible. Your physician should have your test results approximately 7 days after the monitor has been mailed back to [facility].  Call [facility] Customer Care at [phone] if you have questions regarding your ZIO XT patch monitor. Call them immediately if you see an orange light blinking on your monitor.  If your monitor falls off in less than 4 days, contact our Monitor department at [phone]. ?If your monitor becomes loose or falls off after 4 days call [employer] at [phone] for suggestions on securing your monitor.?  Follow-Up: At [facility], you and your health needs are our priority.  As part of our continuing mission to provide you with exceptional heart care, we have created designated Provider Care Teams.  These Care Teams include your primary Cardiologist (physician) and Advanced Practice Providers (APPs -  Physician Assistants and Nurse Practitioners) who all work together to provide you with the care you need, when you need it.  We recommend signing up for the patient portal called "MyChart".  Sign up information is provided on this After Visit Summary.  MyChart is used to connect with patients for Virtual Visits (Telemedicine).  Patients are able to view lab/test results, encounter notes, upcoming appointments, etc.  Non-urgent messages can be sent to your provider as well.   To learn more about what you can do with MyChart, go to [email or web address].    Your next appointment:   3 month(s)  The format for your next appointment:   In Person  Provider:   Epifanio Lesches, MD

## 2020-11-18 NOTE — Telephone Encounter (Signed)
Left message for patient to call and schedule the exercise myoview, echo and 3 month follow up per 11/18/20 AVS (Dr. Bjorn Pippin)

## 2020-11-19 DIAGNOSIS — I493 Ventricular premature depolarization: Secondary | ICD-10-CM | POA: Diagnosis not present

## 2020-11-20 ENCOUNTER — Other Ambulatory Visit: Payer: Self-pay | Admitting: Family

## 2020-11-21 ENCOUNTER — Other Ambulatory Visit: Payer: Self-pay | Admitting: Family

## 2020-11-26 DIAGNOSIS — I493 Ventricular premature depolarization: Secondary | ICD-10-CM | POA: Diagnosis not present

## 2020-11-27 ENCOUNTER — Other Ambulatory Visit: Payer: Self-pay

## 2020-11-27 ENCOUNTER — Encounter (INDEPENDENT_AMBULATORY_CARE_PROVIDER_SITE_OTHER): Payer: Self-pay | Admitting: Family Medicine

## 2020-11-27 ENCOUNTER — Ambulatory Visit (INDEPENDENT_AMBULATORY_CARE_PROVIDER_SITE_OTHER): Payer: Medicare Other | Admitting: Family Medicine

## 2020-11-27 VITALS — BP 115/77 | HR 83 | Temp 97.5°F | Ht 70.0 in | Wt 196.0 lb

## 2020-11-27 DIAGNOSIS — E559 Vitamin D deficiency, unspecified: Secondary | ICD-10-CM | POA: Diagnosis not present

## 2020-11-27 DIAGNOSIS — Z6832 Body mass index (BMI) 32.0-32.9, adult: Secondary | ICD-10-CM

## 2020-11-27 DIAGNOSIS — E669 Obesity, unspecified: Secondary | ICD-10-CM | POA: Diagnosis not present

## 2020-11-27 MED ORDER — VITAMIN D (ERGOCALCIFEROL) 1.25 MG (50000 UNIT) PO CAPS: 50000.0000 [IU] | ORAL_CAPSULE | ORAL | 0 refills | Status: DC

## 2020-11-28 ENCOUNTER — Telehealth (HOSPITAL_COMMUNITY): Payer: Self-pay | Admitting: *Deleted

## 2020-11-28 NOTE — Telephone Encounter (Signed)
Patient given detailed instructions per Myocardial Perfusion Study Information Sheet for the test on 12/05/20 at 7:15. Patient notified to arrive 15 minutes early and that it is imperative to arrive on time for appointment to keep from having the test rescheduled.  If you need to cancel or reschedule your appointment, please call the office within 24 hours of your appointment. . Patient verbalized understanding.Shon Baton, Silas Flood

## 2020-12-01 NOTE — Progress Notes (Signed)
Chief Complaint:   OBESITY [name] is here to discuss his progress with his obesity treatment plan along with follow-up of his obesity related diagnoses. [name] is on keeping a food journal and adhering to recommended goals of 1200-1600 calories and 85+ grams of protein daily and states he is following his eating plan approximately 60% of the time. [name] states he is at the gym for 60 minutes 1 time per week.  Today's visit was #: 18 Starting weight: 223 lbs Starting date: 08/15/2019 Today's weight: 196 lbs Today's date: 11/27/2020 Total lbs lost to date: 27 Total lbs lost since last in-office visit: 0  Interim History: [name] is retaining some water weight. He has gotten off track with his eating plan, but he has started exercising with a trainer. He is ready to get back to journaling.   Subjective:   1. Vitamin D deficiency [name] is stable on Vit D, and he denies nausea, vomiting, or muscle weakness.  Assessment/Plan:   1. Vitamin D deficiency Low Vitamin D level contributes to fatigue and are associated with obesity, breast, and colon cancer. We will refill prescription Vitamin D for 1 month. [name] will follow-up for routine testing of Vitamin D, at least 2-3 times per year to avoid over-replacement.  - Vitamin D, Ergocalciferol, (DRISDOL) 1.25 MG (50000 UNIT) CAPS capsule; Take 1 capsule (50,000 Units total) by mouth once a week.  Dispense: 4 capsule; Refill: 0  2. Obesity, Current BMI 31.7 [name] is currently in the action stage of change. As such, his goal is to continue with weight loss efforts. He has agreed to keeping a food journal and adhering to recommended goals of 1200-1600 calories and 85+ grams of protein daily.   Exercise goals: As is.  Behavioral modification strategies: increasing lean protein intake and meal planning and cooking strategies.  [name] has agreed to follow-up with our clinic in 4 weeks. He was informed of the importance of frequent follow-up visits to  maximize his success with intensive lifestyle modifications for his multiple health conditions.   Objective:   Blood pressure 115/77, pulse 83, temperature (!) 97.5 F (36.4 C), height 5\' 10"  (1.778 m), weight 196 lb (88.9 kg), SpO2 98 %. Body mass index is 28.12 kg/m.  General: Cooperative, alert, well developed, in no acute distress. HEENT: Conjunctivae and lids unremarkable. Cardiovascular: Regular rhythm.  Lungs: Normal work of breathing. Neurologic: No focal deficits.   Lab Results  Component Value Date   CREATININE 1.17 06/25/2020   BUN 22 06/25/2020   NA 144 06/25/2020   K 4.3 06/25/2020   CL 101 06/25/2020   CO2 25 06/25/2020   Lab Results  Component Value Date   ALT 30 06/25/2020   AST 25 06/25/2020   ALKPHOS 71 06/25/2020   BILITOT 0.5 06/25/2020   Lab Results  Component Value Date   HGBA1C 5.7 (H) 06/25/2020   HGBA1C 5.5 02/12/2020   HGBA1C 5.9 (H) 11/13/2019   HGBA1C 6.1 (H) 08/15/2019   HGBA1C 6.1 11/28/2018   Lab Results  Component Value Date   INSULIN 6.3 06/25/2020   INSULIN 7.6 02/12/2020   INSULIN 10.2 11/13/2019   INSULIN 10.0 08/15/2019   Lab Results  Component Value Date   TSH 1.370 08/15/2019   Lab Results  Component Value Date   CHOL 136 06/25/2020   HDL 53 06/25/2020   LDLCALC 65 06/25/2020   TRIG 98 06/25/2020   CHOLHDL 2.6 06/25/2020   Lab Results  Component Value Date  VD25OH 53.4 06/25/2020   VD25OH 63.0 02/12/2020   VD25OH 52.8 11/13/2019   Lab Results  Component Value Date   WBC 4.7 10/31/2019   HGB 15.9 10/31/2019   HCT 48.5 10/31/2019   MCV 84 10/31/2019   PLT 132 (L) 10/31/2019   No results found for: IRON, TIBC, FERRITIN  Attestation Statements:   Reviewed by clinician on day of visit: allergies, medications, problem list, medical history, surgical history, family history, social history, and previous encounter notes.  Time spent on visit including pre-visit chart review and post-visit care and charting  was 38 minutes.    I, Burt Knack, am acting as transcriptionist for Quillian Quince, MD.  I have reviewed the above documentation for accuracy and completeness, and I agree with the above. -  Quillian Quince, MD

## 2020-12-05 ENCOUNTER — Other Ambulatory Visit: Payer: Self-pay

## 2020-12-05 ENCOUNTER — Ambulatory Visit (HOSPITAL_COMMUNITY): Payer: Medicare Other | Attending: Cardiovascular Disease

## 2020-12-05 ENCOUNTER — Ambulatory Visit (HOSPITAL_BASED_OUTPATIENT_CLINIC_OR_DEPARTMENT_OTHER): Payer: Medicare Other

## 2020-12-05 DIAGNOSIS — I5042 Chronic combined systolic (congestive) and diastolic (congestive) heart failure: Secondary | ICD-10-CM | POA: Diagnosis not present

## 2020-12-05 DIAGNOSIS — Q245 Malformation of coronary vessels: Secondary | ICD-10-CM | POA: Insufficient documentation

## 2020-12-05 LAB — MYOCARDIAL PERFUSION IMAGING
Angina Index: 0
Base ST Depression (mm): 0 mm
Duke Treadmill Score: 4
Estimated workload: 4.6
Exercise duration (min): 3 min
Exercise duration (sec): 42 s
LV dias vol: 295 mL (ref 62–150)
LV sys vol: 260 mL
MPHR: 147 {beats}/min
Nuc Stress EF: 12 %
Peak HR: 146 {beats}/min
Percent HR: 99 %
Rest HR: 99 {beats}/min
Rest Nuclear Isotope Dose: 10.3 mCi
SDS: 7
SRS: 10
SSS: 17
ST Depression (mm): 0 mm
Stress Nuclear Isotope Dose: 32.9 mCi
TID: 0.99

## 2020-12-05 LAB — ECHOCARDIOGRAM COMPLETE
Area-P 1/2: 7.44 cm2
Height: 70 in
P 1/2 time: 316 msec
S' Lateral: 5.9 cm
Weight: 3152 oz

## 2020-12-05 MED ORDER — PERFLUTREN LIPID MICROSPHERE
1.0000 mL | INTRAVENOUS | Status: AC | PRN
Start: 2020-12-05 — End: 2020-12-05
  Administered 2020-12-05: 2 mL via INTRAVENOUS

## 2020-12-05 MED ORDER — TECHNETIUM TC 99M TETROFOSMIN IV KIT
32.9000 | PACK | Freq: Once | INTRAVENOUS | Status: AC | PRN
  Administered 2020-12-05: 32.9 via INTRAVENOUS
  Filled 2020-12-05: qty 33

## 2020-12-05 MED ORDER — TECHNETIUM TC 99M TETROFOSMIN IV KIT
10.3000 | PACK | Freq: Once | INTRAVENOUS | Status: AC | PRN
  Administered 2020-12-05: 10.3 via INTRAVENOUS
  Filled 2020-12-05: qty 11

## 2020-12-09 ENCOUNTER — Other Ambulatory Visit (HOSPITAL_BASED_OUTPATIENT_CLINIC_OR_DEPARTMENT_OTHER): Payer: Self-pay

## 2020-12-09 ENCOUNTER — Telehealth: Payer: Self-pay | Admitting: Cardiology

## 2020-12-09 NOTE — Telephone Encounter (Signed)
pt aware of results  Follow up scheduled  

## 2020-12-09 NOTE — Telephone Encounter (Signed)
[name] is returning [name]'s call from yesterday in regards to his Echo and Exercise stress test.

## 2020-12-12 DIAGNOSIS — I10 Essential (primary) hypertension: Secondary | ICD-10-CM | POA: Diagnosis not present

## 2020-12-12 DIAGNOSIS — I493 Ventricular premature depolarization: Secondary | ICD-10-CM | POA: Diagnosis not present

## 2020-12-12 DIAGNOSIS — Z1211 Encounter for screening for malignant neoplasm of colon: Secondary | ICD-10-CM | POA: Diagnosis not present

## 2020-12-12 DIAGNOSIS — I5042 Chronic combined systolic (congestive) and diastolic (congestive) heart failure: Secondary | ICD-10-CM | POA: Diagnosis not present

## 2020-12-12 DIAGNOSIS — E782 Mixed hyperlipidemia: Secondary | ICD-10-CM | POA: Diagnosis not present

## 2020-12-12 DIAGNOSIS — Q245 Malformation of coronary vessels: Secondary | ICD-10-CM | POA: Diagnosis not present

## 2020-12-13 LAB — BASIC METABOLIC PANEL
BUN/Creatinine Ratio: 17 (ref 10–24)
BUN: 22 mg/dL (ref 8–27)
CO2: 22 mmol/L (ref 20–29)
Calcium: 9.5 mg/dL (ref 8.6–10.2)
Chloride: 102 mmol/L (ref 96–106)
Creatinine, Ser: 1.31 mg/dL — ABNORMAL HIGH (ref 0.76–1.27)
Glucose: 90 mg/dL (ref 65–99)
Potassium: 4.6 mmol/L (ref 3.5–5.2)
Sodium: 140 mmol/L (ref 134–144)
eGFR: 57 mL/min/{1.73_m2} — ABNORMAL LOW (ref >59)

## 2020-12-13 LAB — MAGNESIUM: Magnesium: 2.2 mg/dL (ref 1.6–2.3)

## 2020-12-13 LAB — TSH: TSH: 1.57 u[IU]/mL (ref 0.450–4.500)

## 2020-12-14 NOTE — Progress Notes (Signed)
Cardiology Office Note:    Date:  12/17/2020   ID:  Alan CowboyMack H Kenan, DOB 07/13/1945, MRN [id number]  PCP:  Sandford Craze'Sullivan, Melissa, NP  Cardiologist:  Dietrich PatesPaula Ross, MD  Electrophysiologist:  None   Referring MD: Sandford Craze'Sullivan, Melissa, NP   No chief complaint on file.    History of Present Illness:    [name] is a 75 y.o. male with a hx of nonischemic cardiomyopathy, hypertension, OSA, alcohol abuse, hyperlipidemia who presents for follow-up.  Previously followed with Dr. Tenny Crawoss.  Coronary CTA in 2008 showed anomalous RCA off the left main with scores between pulmonary artery and aorta, calcified plaque in proximal LAD causing less than 50% stenosis, calcium score to 48.  Echocardiogram 12/15/2018 showed EF 35 to 40%.  Echocardiogram on 12/05/2020 showed EF less than 20%, grade 2 diastolic dysfunction, mild RV dysfunction, moderate left atrial dilatation, severe right atrial dilatation, possible low-flow low gradient severe aortic stenosis.  Lexiscan Myoview on 12/05/2020 showed EF 12%, normal perfusion.  Since last clinic visit, he reports he has been doing well.  Denies any chest pain, dyspnea, lightheadedness, syncope, lower extremity edema, or palpitations.  Walks 60 minutes on the treadmill at the gym.  Wt Readings from Last 3 Encounters:  12/17/20 201 lb 12.8 oz (91.5 kg)  12/05/20 197 lb (89.4 kg)  11/27/20 196 lb (88.9 kg)     Past Medical History:  Diagnosis Date   Alcohol abuse    Anomalous right coronary artery    Chronic systolic CHF (congestive heart failure) (HCC) 08/18/2016   Echo 1/17:  Diff HK especially in inf wall, mild LVH, EF 30-35, mild AS (mean 10, peak 18) // Echo 11/15: Mild LVH, EF 30-35, moderate HK, Gr 1 DD, mild AI, mild RAE // Echo 8/18: Mild LVH, EF 30-35, diffuse HK, mild aortic stenosis (mean 7), mild AI, mild BAE   Coronary artery calcification 08/18/2016   Cor CTA 1/08: Ca score 248 // LHC 1/08: oLAD 25%   History of ETOH abuse    Hyperlipidemia 08/27/2013    Hypertension    Lactose intolerance    Non-ischemic cardiomyopathy (HCC)    Presumed secondary to Etoh and HTN  (Prev EF 20-25% -- improved to 50%) // LHC 1/08: oLAD 25, EF 25%    OSA (obstructive sleep apnea) 10/17/2013   Severe per home sleep study performed on 09/26/13.    Sleep apnea    SOB (shortness of breath)    Vitamin D deficiency     No past surgical history on file.  Current Medications: Current Meds  Medication Sig   aspirin EC 81 MG tablet Take 1 tablet (81 mg total) by mouth daily.   atorvastatin (LIPITOR) 10 MG tablet TAKE 1 TABLET BY MOUTH ONCE DAILY AT  6PM   carvedilol (COREG) 6.25 MG tablet Take 1 tablet (6.25 mg total) by mouth 2 (two) times daily with a meal.   Coenzyme Q10-Vitamin E (QUNOL ULTRA COQ10 PO) Take by mouth.   furosemide (LASIX) 20 MG tablet Take 20 mg by mouth 2 (two) times daily.   losartan (COZAAR) 25 MG tablet Take 1 tablet (25 mg total) by mouth daily.   Multiple Vitamins-Minerals (PRESERVISION AREDS 2+MULTI VIT PO) Take 1 tablet by mouth daily.   potassium chloride (KLOR-CON) 10 MEQ tablet TAKE 1  BY MOUTH ONCE DAILY   sildenafil (REVATIO) 20 MG tablet Take 1 tablet (20 mg total) by mouth 3 (three) times daily.   spironolactone (ALDACTONE) 25 MG tablet Take 1/2 (  one-half) tablet by mouth once daily   Vitamin D, Ergocalciferol, (DRISDOL) 1.25 MG (50000 UNIT) CAPS capsule Take 1 capsule (50,000 Units total) by mouth once a week.     Allergies:   Lisinopril   Social History   Socioeconomic History   Marital status: Married    Spouse name: [name]   Number of children: 3   Years of education: Not on file   Highest education level: Not on file  Occupational History   Occupation: Retired Radiographer, therapeutic  Tobacco Use   Smoking status: Former    Years: 20.00    Types: Cigarettes    Quit date: 04/12/2002    Years since quitting: 18.6   Smokeless tobacco: Never  Vaping Use   Vaping Use: Never used  Substance and Sexual Activity   Alcohol use: Not  Currently    Alcohol/week: 0.0 standard drinks   Drug use: No    Comment: history of marijuana use   Sexual activity: Yes  Other Topics Concern   Not on file  Social History Narrative   2 sons and daughter   1 son in Hotel manager. Living with wife 30yrs [name]).  Use to live near [place].   Social Determinants of Health   Financial Resource Strain: Not on file  Food Insecurity: Not on file  Transportation Needs: No Transportation Needs   Lack of Transportation (Medical): No   Lack of Transportation (Non-Medical): No  Physical Activity: Inactive   Days of Exercise per Week: 0 days   Minutes of Exercise per Session: 0 min  Stress: Not on file  Social Connections: Moderately Isolated   Frequency of Communication with Friends and Family: More than three times a week   Frequency of Social Gatherings with Friends and Family: More than three times a week   Attends Religious Services: Never   Database administrator or Organizations: No   Attends Engineer, structural: Never   Marital Status: Married     Family History: The patient's family history includes Alcohol abuse in his father and mother; Heart disease in his maternal grandmother and mother; Heart failure in his father; High Cholesterol in his mother; High blood pressure in his mother; Stroke in his maternal grandmother; Sudden death in his mother. There is no history of Diabetes.  ROS:   Please see the history of present illness.     All other systems reviewed and are negative.  EKGs/Labs/Other Studies Reviewed:    The following studies were reviewed today:   EKG:  EKG is ordered today.  The ekg ordered today demonstrates sinus rhythm, first-degree AV block, rate 83, nonspecific interventricular conduction delay, TWI  Recent Labs: 06/25/2020: ALT 30 12/12/2020: BUN 22; Creatinine, Ser 1.31; Magnesium 2.2; Potassium 4.6; Sodium 140; TSH 1.570  Recent Lipid Panel    Component Value Date/Time   CHOL 136 06/25/2020  0842   TRIG 98 06/25/2020 0842   HDL 53 06/25/2020 0842   CHOLHDL 2.6 06/25/2020 0842   CHOLHDL 3 04/16/2019 0738   VLDL 28.0 04/16/2019 0738   LDLCALC 65 06/25/2020 0842    Physical Exam:    VS:  BP 110/74   Pulse 83   Ht 5\' 10"  (1.778 m)   Wt 201 lb 12.8 oz (91.5 kg)   SpO2 98%   BMI 28.96 kg/m     Wt Readings from Last 3 Encounters:  12/17/20 201 lb 12.8 oz (91.5 kg)  12/05/20 197 lb (89.4 kg)  11/27/20 196 lb (88.9 kg)  GEN:  Well nourished, well developed in no acute distress HEENT: Normal NECK: No JVD; No carotid bruits CARDIAC: Irregular, normal rate, no murmurs, rubs, gallops RESPIRATORY:  Clear to auscultation without rales, wheezing or rhonchi  ABDOMEN: Soft, non-tender, non-distended MUSCULOSKELETAL:  No edema; No deformity  SKIN: Warm and dry NEUROLOGIC:  Alert and oriented x 3 PSYCHIATRIC:  Normal affect   ASSESSMENT:    1. Chronic combined systolic and diastolic heart failure (HCC)   2. Aortic valve stenosis, etiology of cardiac valve disease unspecified   3. Medication management   4. PVC's (premature ventricular contractions)   5. Essential hypertension   6. Mixed hyperlipidemia     PLAN:    Chronic combined systolic and diastolic heart failure: Coronary CTA in 2008 showed anomalous RCA off the left main with scores between pulmonary artery and aorta, calcified plaque in proximal LAD causing less than 50% stenosis, calcium score 248.  Echocardiogram 12/15/2018 showed EF 35 to 40%..  Echocardiogram 12/15/2018 showed EF 35 to 40%.  Echocardiogram on 12/05/2020 showed EF less than 20%, grade 2 diastolic dysfunction, mild RV dysfunction, moderate left atrial dilatation, severe right atrial dilatation, possible low-flow low gradient severe aortic stenosis.  Lexiscan Myoview on 12/05/2020 showed EF 12%, normal perfusion. -Developed angioedema with lisinopril.  Will start losartan 25 mg daily.  Check BMET -Continue carvedilol 6.25 mg twice daily -Continue  spironolactone 12.5 mg daily -Continue Lasix 20 mg BID.  Appears euvolemic.   -Unclear etiology of cardiomyopathy.  Recommend cardiac MRI for further evaluation   ?Aortic stenosis: echocardiogram on 12/05/2020 possible low-flow low gradient severe aortic stenosis, though mean gradient only 7 mmHg.  Check AV calcium score.  Anomalous RCA off left main with interarterial course: Exercise Myoview as above to evaluate for ischemia.  PVCs: Noted on EKG in clinic, however Zio patch x3 days on 11/27/2020 showed 2.4% PVC burden  Hypertension: Continue carvedilol 6.25 mg twice daily and spironolactone 12.5 mg daily.  Add losartan as above  Hyperlipidemia: Continue atorvastatin 10 mg daily.  LDL 65 on 06/25/2020  OSA: on CPAP   RTC in 2 months    Medication Adjustments/Labs and Tests Ordered: Current medicines are reviewed at length with the patient today.  Concerns regarding medicines are outlined above.  Orders Placed This Encounter  Procedures   MR CARDIAC MORPHOLOGY W WO CONTRAST   CT CARDIAC SCORING (SELF PAY ONLY)   Basic metabolic panel   CBC   EKG 12-Lead     Meds ordered this encounter  Medications   losartan (COZAAR) 25 MG tablet    Sig: Take 1 tablet (25 mg total) by mouth daily.    Dispense:  90 tablet    Refill:  3     Patient Instructions  Medication Instructions:  START Losartan 25 mg daily  *If you need a refill on your cardiac medications before your next appointment, please call your pharmacy*   Lab Work: Please return for labs in 1 week (BMET, CBC)  Our in office lab hours are Monday-Friday 8:00-4:00, closed for lunch 12:45-1:45 pm.  No appointment needed.  Testing/Procedures: Your physician has requested that you have a cardiac MRI. Cardiac MRI uses a computer to create images of your heart as its beating, producing both still and moving pictures of your heart and major blood vessels. For further information please visit [email or web address]. Please follow  the instruction sheet given to you today for more information.  CT coronary calcium score. This test is done at [address]. This is $99 out of pocket.   Coronary CalciumScan A coronary calcium scan is an imaging test used to look for deposits of calcium and other fatty materials (plaques) in the inner lining of the blood vessels of the heart (coronary arteries). These deposits of calcium and plaques can partly clog and narrow the coronary arteries without producing any symptoms or warning signs. This puts a person at risk for a heart attack. This test can detect these deposits before symptoms develop. Tell a health care provider about: Any allergies you have. All medicines you are taking, including vitamins, herbs, eye drops, creams, and over-the-counter medicines. Any problems you or family members have had with anesthetic medicines. Any blood disorders you have. Any surgeries you have had. Any medical conditions you have. Whether you are pregnant or may be pregnant. What are the risks? Generally, this is a safe procedure. However, problems may occur, including: Harm to a pregnant woman and her unborn baby. This test involves the use of radiation. Radiation exposure can be dangerous to a pregnant woman and her unborn baby. If you are pregnant, you generally should not have this procedure done. Slight increase in the risk of cancer. This is because of the radiation involved in the test. What happens before the procedure? No preparation is needed for this procedure. What happens during the procedure? You will undress and remove any jewelry around your neck or chest. You will put on a hospital gown. Sticky electrodes will be placed on your chest. The electrodes will be connected to an electrocardiogram (ECG) machine to record a tracing of the electrical activity of your heart. A CT scanner will take pictures of your heart. During this time, you will be asked to lie still  and hold your breath for 2-3 seconds while a picture of your heart is being taken. The procedure may vary among health care providers and hospitals. What happens after the procedure? You can get dressed. You can return to your normal activities. It is up to you to get the results of your test. Ask your health care provider, or the department that is doing the test, when your results will be ready. Summary A coronary calcium scan is an imaging test used to look for deposits of calcium and other fatty materials (plaques) in the inner lining of the blood vessels of the heart (coronary arteries). Generally, this is a safe procedure. Tell your health care provider if you are pregnant or may be pregnant. No preparation is needed for this procedure. A CT scanner will take pictures of your heart. You can return to your normal activities after the scan is done. This information is not intended to replace advice given to you by your health care provider. Make sure you discuss any questions you have with your health care provider. Document Released: 09/25/2007 Document Revised: 02/16/2016 Document Reviewed: 02/16/2016 Elsevier Interactive Patient Education  2017 [employer].  Follow-Up: At [facility], you and your health needs are our priority.  As part of our continuing mission to provide you with exceptional heart care, we have created designated Provider Care Teams.  These Care Teams include your primary Cardiologist (physician) and Advanced Practice Providers (APPs -  Physician Assistants and Nurse Practitioners) who all work together to provide you with the care you need, when you need it.  We recommend signing up for the patient portal called "MyChart".  Sign up information is provided on this After Visit Summary.  MyChart is used to  connect with patients for Virtual Visits (Telemedicine).  Patients are able to view lab/test results, encounter notes, upcoming appointments, etc.  Non-urgent  messages can be sent to your provider as well.   To learn more about what you can do with MyChart, go to [email or web address].    Your next appointment:   2 weeks with pharmacist (med titration) 2 months with Dr. Bjorn Pippin     Signed, Little Ishikawa, MD  12/17/2020 3:23 PM    [facility]

## 2020-12-17 ENCOUNTER — Encounter: Payer: Self-pay | Admitting: Cardiology

## 2020-12-17 ENCOUNTER — Other Ambulatory Visit: Payer: Self-pay

## 2020-12-17 ENCOUNTER — Ambulatory Visit (INDEPENDENT_AMBULATORY_CARE_PROVIDER_SITE_OTHER): Payer: Medicare Other | Admitting: Cardiology

## 2020-12-17 VITALS — BP 110/74 | HR 83 | Ht 70.0 in | Wt 201.8 lb

## 2020-12-17 DIAGNOSIS — I493 Ventricular premature depolarization: Secondary | ICD-10-CM

## 2020-12-17 DIAGNOSIS — I350 Nonrheumatic aortic (valve) stenosis: Secondary | ICD-10-CM

## 2020-12-17 DIAGNOSIS — Z79899 Other long term (current) drug therapy: Secondary | ICD-10-CM | POA: Diagnosis not present

## 2020-12-17 DIAGNOSIS — E782 Mixed hyperlipidemia: Secondary | ICD-10-CM | POA: Diagnosis not present

## 2020-12-17 DIAGNOSIS — I10 Essential (primary) hypertension: Secondary | ICD-10-CM | POA: Diagnosis not present

## 2020-12-17 DIAGNOSIS — I5042 Chronic combined systolic (congestive) and diastolic (congestive) heart failure: Secondary | ICD-10-CM | POA: Diagnosis not present

## 2020-12-17 MED ORDER — LOSARTAN POTASSIUM 25 MG PO TABS: 25.0000 mg | ORAL_TABLET | Freq: Every day | ORAL | 3 refills | Status: DC

## 2020-12-17 NOTE — Patient Instructions (Signed)
Medication Instructions:  START Losartan 25 mg daily  *If you need a refill on your cardiac medications before your next appointment, please call your pharmacy*   Lab Work: Please return for labs in 1 week (BMET, CBC)  Our in office lab hours are Monday-Friday 8:00-4:00, closed for lunch 12:45-1:45 pm.  No appointment needed.  Testing/Procedures: Your physician has requested that you have a cardiac MRI. Cardiac MRI uses a computer to create images of your heart as its beating, producing both still and moving pictures of your heart and major blood vessels. For further information please visit [email or web address]. Please follow the instruction sheet given to you today for more information.  CT coronary calcium score. This test is done at [address]. This is $99 out of pocket.   Coronary CalciumScan A coronary calcium scan is an imaging test used to look for deposits of calcium and other fatty materials (plaques) in the inner lining of the blood vessels of the heart (coronary arteries). These deposits of calcium and plaques can partly clog and narrow the coronary arteries without producing any symptoms or warning signs. This puts a person at risk for a heart attack. This test can detect these deposits before symptoms develop. Tell a health care provider about: Any allergies you have. All medicines you are taking, including vitamins, herbs, eye drops, creams, and over-the-counter medicines. Any problems you or family members have had with anesthetic medicines. Any blood disorders you have. Any surgeries you have had. Any medical conditions you have. Whether you are pregnant or may be pregnant. What are the risks? Generally, this is a safe procedure. However, problems may occur, including: Harm to a pregnant woman and her unborn baby. This test involves the use of radiation. Radiation exposure can be dangerous to a pregnant woman and her unborn baby. If you are pregnant,  you generally should not have this procedure done. Slight increase in the risk of cancer. This is because of the radiation involved in the test. What happens before the procedure? No preparation is needed for this procedure. What happens during the procedure? You will undress and remove any jewelry around your neck or chest. You will put on a hospital gown. Sticky electrodes will be placed on your chest. The electrodes will be connected to an electrocardiogram (ECG) machine to record a tracing of the electrical activity of your heart. A CT scanner will take pictures of your heart. During this time, you will be asked to lie still and hold your breath for 2-3 seconds while a picture of your heart is being taken. The procedure may vary among health care providers and hospitals. What happens after the procedure? You can get dressed. You can return to your normal activities. It is up to you to get the results of your test. Ask your health care provider, or the department that is doing the test, when your results will be ready. Summary A coronary calcium scan is an imaging test used to look for deposits of calcium and other fatty materials (plaques) in the inner lining of the blood vessels of the heart (coronary arteries). Generally, this is a safe procedure. Tell your health care provider if you are pregnant or may be pregnant. No preparation is needed for this procedure. A CT scanner will take pictures of your heart. You can return to your normal activities after the scan is done. This information is not intended to replace advice given to you by your health care provider. Make  sure you discuss any questions you have with your health care provider. Document Released: 09/25/2007 Document Revised: 02/16/2016 Document Reviewed: 02/16/2016 Elsevier Interactive Patient Education  2017 [employer].  Follow-Up: At [facility], you and your health needs are our priority.  As part of our continuing  mission to provide you with exceptional heart care, we have created designated Provider Care Teams.  These Care Teams include your primary Cardiologist (physician) and Advanced Practice Providers (APPs -  Physician Assistants and Nurse Practitioners) who all work together to provide you with the care you need, when you need it.  We recommend signing up for the patient portal called "MyChart".  Sign up information is provided on this After Visit Summary.  MyChart is used to connect with patients for Virtual Visits (Telemedicine).  Patients are able to view lab/test results, encounter notes, upcoming appointments, etc.  Non-urgent messages can be sent to your provider as well.   To learn more about what you can do with MyChart, go to [email or web address].    Your next appointment:   2 weeks with pharmacist (med titration) 2 months with Dr. Bjorn Pippin

## 2020-12-18 ENCOUNTER — Encounter: Payer: Self-pay | Admitting: Family

## 2020-12-18 ENCOUNTER — Telehealth: Payer: Self-pay | Admitting: Family

## 2020-12-18 DIAGNOSIS — R195 Other fecal abnormalities: Secondary | ICD-10-CM

## 2020-12-18 LAB — COLOGUARD: Cologuard: POSITIVE — AB

## 2020-12-18 NOTE — Telephone Encounter (Signed)
Please advise pt that his cologuard test was positive. It is important that he complete a colonoscopy.  I have placed the referral.

## 2020-12-19 NOTE — Telephone Encounter (Signed)
Patient advised of results and referral ?

## 2020-12-25 ENCOUNTER — Other Ambulatory Visit: Payer: Self-pay

## 2020-12-25 ENCOUNTER — Encounter (INDEPENDENT_AMBULATORY_CARE_PROVIDER_SITE_OTHER): Payer: Self-pay | Admitting: Family Medicine

## 2020-12-25 ENCOUNTER — Ambulatory Visit (INDEPENDENT_AMBULATORY_CARE_PROVIDER_SITE_OTHER): Payer: Medicare Other | Admitting: Family Medicine

## 2020-12-25 VITALS — BP 119/81 | HR 75 | Temp 97.9°F | Ht 70.0 in | Wt 198.0 lb

## 2020-12-25 DIAGNOSIS — E559 Vitamin D deficiency, unspecified: Secondary | ICD-10-CM | POA: Diagnosis not present

## 2020-12-25 DIAGNOSIS — I10 Essential (primary) hypertension: Secondary | ICD-10-CM | POA: Diagnosis not present

## 2020-12-25 DIAGNOSIS — Z6832 Body mass index (BMI) 32.0-32.9, adult: Secondary | ICD-10-CM | POA: Diagnosis not present

## 2020-12-25 DIAGNOSIS — Z79899 Other long term (current) drug therapy: Secondary | ICD-10-CM | POA: Diagnosis not present

## 2020-12-25 DIAGNOSIS — E669 Obesity, unspecified: Secondary | ICD-10-CM

## 2020-12-25 DIAGNOSIS — I5042 Chronic combined systolic (congestive) and diastolic (congestive) heart failure: Secondary | ICD-10-CM | POA: Diagnosis not present

## 2020-12-25 DIAGNOSIS — I350 Nonrheumatic aortic (valve) stenosis: Secondary | ICD-10-CM | POA: Diagnosis not present

## 2020-12-25 MED ORDER — VITAMIN D (ERGOCALCIFEROL) 1.25 MG (50000 UNIT) PO CAPS: 50000.0000 [IU] | ORAL_CAPSULE | ORAL | 0 refills | Status: DC

## 2020-12-26 LAB — CBC
Hematocrit: 48.4 % (ref 37.5–51.0)
Hemoglobin: 16.3 g/dL (ref 13.0–17.7)
MCH: 28.3 pg (ref 26.6–33.0)
MCHC: 33.7 g/dL (ref 31.5–35.7)
MCV: 84 fL (ref 79–97)
Platelets: 193 10*3/uL (ref 150–450)
RBC: 5.75 x10E6/uL (ref 4.14–5.80)
RDW: 15.3 % (ref 11.6–15.4)
WBC: 4 10*3/uL (ref 3.4–10.8)

## 2020-12-26 LAB — BASIC METABOLIC PANEL WITH GFR
BUN/Creatinine Ratio: 15 (ref 10–24)
BUN: 21 mg/dL (ref 8–27)
CO2: 25 mmol/L (ref 20–29)
Calcium: 9.8 mg/dL (ref 8.6–10.2)
Chloride: 103 mmol/L (ref 96–106)
Creatinine, Ser: 1.44 mg/dL — ABNORMAL HIGH (ref 0.76–1.27)
Glucose: 74 mg/dL (ref 65–99)
Potassium: 4.5 mmol/L (ref 3.5–5.2)
Sodium: 141 mmol/L (ref 134–144)
eGFR: 51 mL/min/1.73 — ABNORMAL LOW (ref >59)

## 2020-12-28 NOTE — Progress Notes (Signed)
Chief Complaint:   OBESITY [name] is here to discuss his progress with his obesity treatment plan along with follow-up of his obesity related diagnoses. [name] is on keeping a food journal and adhering to recommended goals of 1200-1600 calories and 85+ grams of protein daily and states he is following his eating plan approximately 0% of the time. [name] states he is doing 0 minutes 0 times per week.  Today's visit was #: 19 Starting weight: 223 lbs Starting date: 08/15/2019 Today's weight: 198 lbs Today's date: 12/25/2020 Total lbs lost to date: 25 Total lbs lost since last in-office visit: 0  Interim History: [name] hasn't been following his plan very well, and his weight is going in the wrong direction. He is working with his Cardiologist and he is trying  to deal with cardiac medicine side effects. He feels after his next cardiac test he will be able to concentrate on meal planning more.  Subjective:   1. Vitamin D deficiency [name] is stable on Vit D, and he denies signs of over-replacement.  2. Essential hypertension [name]'s blood pressure is stable on his medications. He is trying to improve his eating but it appears that his sodium levels are higher than recommended.  Assessment/Plan:   1. Vitamin D deficiency Low Vitamin D level contributes to fatigue and are associated with obesity, breast, and colon cancer. We will refill prescription Vitamin D for 1 month. [name] will follow-up for routine testing of Vitamin D, at least 2-3 times per year to avoid over-replacement.  - Vitamin D, Ergocalciferol, (DRISDOL) 1.25 MG (50000 UNIT) CAPS capsule; Take 1 capsule (50,000 Units total) by mouth once a week.  Dispense: 4 capsule; Refill: 0  2. Essential hypertension [name] will continue with diet and exercise, and will watch his sodium as the goal is below 2300 mg daily. He will watch for signs of hypotension as he continues his lifestyle modifications.  3. Obesity, Current BMI 28.5 [name] is  currently in the action stage of change. As such, his goal is to continue with weight loss efforts. He has agreed to keeping a food journal and adhering to recommended goals of 1200-1600 calories and 85+ grams of protein daily.   Behavioral modification strategies: increasing lean protein intake, decreasing sodium intake, and meal planning and cooking strategies.  [name] has agreed to follow-up with our clinic in 3 weeks. He was informed of the importance of frequent follow-up visits to maximize his success with intensive lifestyle modifications for his multiple health conditions.   Objective:   Blood pressure 119/81, pulse 75, temperature 97.9 F (36.6 C), height 5\' 10"  (1.778 m), weight 198 lb (89.8 kg), SpO2 97 %. Body mass index is 28.41 kg/m.  General: Cooperative, alert, well developed, in no acute distress. HEENT: Conjunctivae and lids unremarkable. Cardiovascular: Regular rhythm.  Lungs: Normal work of breathing. Neurologic: No focal deficits.   Lab Results  Component Value Date   CREATININE 1.44 (H) 12/25/2020   BUN 21 12/25/2020   NA 141 12/25/2020   K 4.5 12/25/2020   CL 103 12/25/2020   CO2 25 12/25/2020   Lab Results  Component Value Date   ALT 30 06/25/2020   AST 25 06/25/2020   ALKPHOS 71 06/25/2020   BILITOT 0.5 06/25/2020   Lab Results  Component Value Date   HGBA1C 5.7 (H) 06/25/2020   HGBA1C 5.5 02/12/2020   HGBA1C 5.9 (H) 11/13/2019   HGBA1C 6.1 (H) 08/15/2019   HGBA1C 6.1 11/28/2018   Lab Results  Component Value Date   INSULIN 6.3 06/25/2020   INSULIN 7.6 02/12/2020   INSULIN 10.2 11/13/2019   INSULIN 10.0 08/15/2019   Lab Results  Component Value Date   TSH 1.570 12/12/2020   Lab Results  Component Value Date   CHOL 136 06/25/2020   HDL 53 06/25/2020   LDLCALC 65 06/25/2020   TRIG 98 06/25/2020   CHOLHDL 2.6 06/25/2020   Lab Results  Component Value Date   VD25OH 53.4 06/25/2020   VD25OH 63.0 02/12/2020   VD25OH 52.8 11/13/2019    Lab Results  Component Value Date   WBC 4.0 12/25/2020   HGB 16.3 12/25/2020   HCT 48.4 12/25/2020   MCV 84 12/25/2020   PLT 193 12/25/2020   No results found for: IRON, TIBC, FERRITIN  Obesity Behavioral Intervention:   Approximately 15 minutes were spent on the discussion below.  ASK: We discussed the diagnosis of obesity with [name] today and [name] agreed to give [place] permission to discuss obesity behavioral modification therapy today.  ASSESS: [name] has the diagnosis of obesity and his BMI today is 28.5. [name] is in the action stage of change.   ADVISE: [name] was educated on the multiple health risks of obesity as well as the benefit of weight loss to improve his health. He was advised of the need for long term treatment and the importance of lifestyle modifications to improve his current health and to decrease his risk of future health problems.  AGREE: Multiple dietary modification options and treatment options were discussed and [name] agreed to follow the recommendations documented in the above note.  ARRANGE: [name] was educated on the importance of frequent visits to treat obesity as outlined per CMS and USPSTF guidelines and agreed to schedule his next follow up appointment today.  Attestation Statements:   Reviewed by clinician on day of visit: allergies, medications, problem list, medical history, surgical history, family history, social history, and previous encounter notes.   I, Burt Knack, am acting as transcriptionist for Quillian Quince, MD.  I have reviewed the above documentation for accuracy and completeness, and I agree with the above. -  Quillian Quince, MD

## 2020-12-29 ENCOUNTER — Ambulatory Visit: Payer: Medicare Other | Attending: Internal Medicine

## 2020-12-29 ENCOUNTER — Other Ambulatory Visit (HOSPITAL_BASED_OUTPATIENT_CLINIC_OR_DEPARTMENT_OTHER): Payer: Self-pay

## 2020-12-29 DIAGNOSIS — Z23 Encounter for immunization: Secondary | ICD-10-CM

## 2020-12-29 MED ORDER — FLUAD QUADRIVALENT 0.5 ML IM PRSY
PREFILLED_SYRINGE | INTRAMUSCULAR | 0 refills | Status: DC
  Filled 2020-12-29: qty 0.5, 1d supply, fill #0

## 2020-12-29 NOTE — Progress Notes (Signed)
   Covid-19 Vaccination Clinic  Name:  [name]    MRN: [id number] DOB: 05/26/1945  12/29/2020  [name] was observed post Covid-19 immunization for 15 minutes without incident. He was provided with Vaccine Information Sheet and instruction to access the V-Safe system.   [name] was instructed to call 911 with any severe reactions post vaccine: Difficulty breathing  Swelling of face and throat  A fast heartbeat  A bad rash all over body  Dizziness and weakness

## 2020-12-30 ENCOUNTER — Encounter: Payer: Self-pay | Admitting: Gastroenterology

## 2021-01-01 ENCOUNTER — Ambulatory Visit (INDEPENDENT_AMBULATORY_CARE_PROVIDER_SITE_OTHER): Payer: Medicare Other | Admitting: Pharmacist

## 2021-01-01 ENCOUNTER — Other Ambulatory Visit: Payer: Self-pay

## 2021-01-01 VITALS — BP 132/94 | HR 87 | Resp 17 | Ht 70.0 in | Wt 208.4 lb

## 2021-01-01 DIAGNOSIS — I5022 Chronic systolic (congestive) heart failure: Secondary | ICD-10-CM

## 2021-01-01 MED ORDER — FUROSEMIDE 20 MG PO TABS: 20.0000 mg | ORAL_TABLET | Freq: Two times a day (BID) | ORAL | 1 refills | Status: DC

## 2021-01-01 MED ORDER — ENTRESTO 24-26 MG PO TABS: 1.0000 | ORAL_TABLET | Freq: Two times a day (BID) | ORAL | 0 refills | Status: DC

## 2021-01-01 NOTE — Patient Instructions (Addendum)
It was nice meeting you today!  We would like your blood pressure to stay less than 130/80  Continue your spironolactone 12.5mg  once a day Continue your carvedilol 6.25mg  twice a day Stop your losartan at this time We will start a new medication called Entresto 24-26mg .  You will take 1 tablet twice a day  Continue to avoid salt and keep checking your blood pressure at home  We will see you back in a month to titrate your medications as needed and check your lab work if it has not been done yet  Please call with any questions!  Laural Golden, PharmD, BCACP, CDCES, CPP [facility] [address], [place], [place] [address] Phone: [phone]; Fax: [phone] 01/01/2021 3:12 PM

## 2021-01-01 NOTE — Progress Notes (Signed)
Patient ID: [name]                 DOB: 1945/10/02                      MRN: [id number]     HPI: [name] is a 75 y.o. male referred by Dr. Bjorn Pippin to pharmacy clinic for HF medication management. PMH is significant for CHF, OSA, HTN, obesity and pre DM. Most recent LVEF 12% on 12/05/20.  Patient presents today in good spirits.  Reports he feels better since starting the losartan. SOB has decreased.  Remains active and is compliant with all medications.  Wakes up at 5:30am and from 6:30am-7 he is outside in his garden planting vegetables and making flower beds.  Previously had to take an extra furosemide when doing physcial work but reports he has not needed to in about 3 weeks.    Takes blood pressure at home but did not bring machine or log.  Believes his last systolic was 118.  Has no SOB at rest.  Uses a CPAP for sleep.  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 Has not drank alcohol in over a year and has eliminated salt from his diet.  Uses [employer] salt free seasoning which is made of pepper and other spices.    Goes to [facility] and is on a high fiber 1600 calorie diet.  Breakfast, eggs, toast coffee Lunch - protein bar and fair life milk Dinner - salmon  Denies dizziness, lightheadedness, chest pain or palpitations.  Has not been weighing himself at home since he goes to [facility] who also frequently   Current CHF meds: losartan 25mg  daily, carvedilol 6.25mg  BID, spironolactone 12.5mg  daily, furosemide 20mg  BID  BP goal: <130/80  Family History: mother: died at 93, father died at 69 - alcohol   Wt Readings from Last 3 Encounters:  12/25/20 198 lb (89.8 kg)  12/17/20 201 lb 12.8 oz (91.5 kg)  12/05/20 197 lb (89.4 kg)   BP Readings from Last 3 Encounters:  12/25/20 119/81  12/17/20 110/74  11/27/20 115/77   Pulse Readings from Last 3 Encounters:  12/25/20 75  12/17/20 83  11/27/20 83     Renal function: Estimated Creatinine Clearance: 51.5 mL/min (A) (by C-G formula based on SCr of 1.44 mg/dL (H)).  Past Medical History:  Diagnosis Date   Alcohol abuse    Anomalous right coronary artery    Chronic systolic CHF (congestive heart failure) (HCC) 08/18/2016   Echo 1/17:  Diff HK especially in inf wall, mild LVH, EF 30-35, mild AS (mean 10, peak 18) // Echo 11/15: Mild LVH, EF 30-35, moderate HK, Gr 1 DD, mild AI, mild RAE // Echo 8/18: Mild LVH, EF 30-35, diffuse HK, mild aortic stenosis (mean 7), mild AI, mild BAE   Coronary artery calcification 08/18/2016   Cor CTA 1/08: Ca score 248 // LHC 1/08: oLAD 25%   History of ETOH abuse    Hyperlipidemia 08/27/2013   Hypertension    Lactose intolerance    Non-ischemic cardiomyopathy (HCC)    Presumed secondary to Etoh and HTN  (Prev EF 20-25% -- improved to 50%) // LHC 1/08: oLAD 25, EF 25%    OSA (obstructive sleep apnea) 10/17/2013   Severe per home sleep study performed on 09/26/13.    Sleep apnea    SOB (shortness of breath)    Vitamin  D deficiency     Current Outpatient Medications on File Prior to Visit  Medication Sig Dispense Refill   aspirin EC 81 MG tablet Take 1 tablet (81 mg total) by mouth daily. 90 tablet 3   atorvastatin (LIPITOR) 10 MG tablet TAKE 1 TABLET BY MOUTH ONCE DAILY AT  6PM 90 tablet 0   carvedilol (COREG) 6.25 MG tablet Take 1 tablet (6.25 mg total) by mouth 2 (two) times daily with a meal. 60 tablet 5   Coenzyme Q10-Vitamin E (QUNOL ULTRA COQ10 PO) Take by mouth.     furosemide (LASIX) 20 MG tablet Take 20 mg by mouth 2 (two) times daily.     influenza vaccine adjuvanted (FLUAD QUADRIVALENT) 0.5 ML injection Inject into the muscle. 0.5 mL 0   losartan (COZAAR) 25 MG tablet Take 1 tablet (25 mg total) by mouth daily. 90 tablet 3   Multiple Vitamins-Minerals (PRESERVISION AREDS 2+MULTI VIT PO) Take 1 tablet by mouth daily.     potassium chloride (KLOR-CON) 10 MEQ tablet TAKE 1  BY MOUTH ONCE DAILY 90  tablet 0   sildenafil (REVATIO) 20 MG tablet Take 1 tablet (20 mg total) by mouth 3 (three) times daily. 30 tablet 2   spironolactone (ALDACTONE) 25 MG tablet Take 1/2 (one-half) tablet by mouth once daily 45 tablet 2   Vitamin D, Ergocalciferol, (DRISDOL) 1.25 MG (50000 UNIT) CAPS capsule Take 1 capsule (50,000 Units total) by mouth once a week. 4 capsule 0   No current facility-administered medications on file prior to visit.    Allergies  Allergen Reactions   Lisinopril Swelling    ANGIOEDEMA     Assessment/Plan:  1. CHF -  Patient BP in room today 132/94 which is above goal of <130/80.  Elevated higher than patient's self reported home reading but can not verify.  Patient feels remarkably well for his low EF.  Is physically active and following a low sodium diet.  Encouraged him to keep track of his weight at home.    Since patient is tolerating losartan, patient will finish his current Rx and then recommend switching to [facility] for maximum CHF benefit.  Printed 30 day free copay card.  Believes he has a week or 2 left.  Will continue other medications and recheck in 1 month.  Update BMP at that time unless he has drawn sooner at [facility]  Continue carvedilol 6.25mg  BID Continue spironolactone 12.5mg  daily Continue furosemide 20mg  BID Stop losartan Start Entresto 24-26mg  BID Recheck in 4 weeks  , PharmD, BCACP, CDCES, CPP [facility] [address] [place], [place] Phone: [phone]; Fax: [phone] 01/01/2021 5:09 PM

## 2021-01-05 ENCOUNTER — Other Ambulatory Visit (HOSPITAL_BASED_OUTPATIENT_CLINIC_OR_DEPARTMENT_OTHER): Payer: Self-pay

## 2021-01-05 ENCOUNTER — Telehealth: Payer: Self-pay | Admitting: *Deleted

## 2021-01-05 MED ORDER — COVID-19MRNA BIVAL VACC PFIZER 30 MCG/0.3ML IM SUSP
INTRAMUSCULAR | 0 refills | Status: DC
  Filled 2021-01-05: qty 0.3, 1d supply, fill #0

## 2021-01-05 NOTE — Telephone Encounter (Signed)
PA submitted via Covermymeds for Entresto 24/26  PA approved 12/06/20-01/05/23  Key: [id number] PA Case ID: [id number] Rx #: [id number]

## 2021-01-07 ENCOUNTER — Telehealth: Payer: Self-pay | Admitting: Cardiology

## 2021-01-07 DIAGNOSIS — I5022 Chronic systolic (congestive) heart failure: Secondary | ICD-10-CM

## 2021-01-07 MED ORDER — FUROSEMIDE 20 MG PO TABS: 20.0000 mg | ORAL_TABLET | Freq: Two times a day (BID) | ORAL | 2 refills | Status: DC

## 2021-01-07 NOTE — Telephone Encounter (Signed)
*  STAT* If patient is at the pharmacy, call can be transferred to refill team.   1. Which medications need to be refilled? (please list name of each medication and dose if known) furosemide (LASIX) 20 MG tablet  2. Which pharmacy/location (including street and city if local pharmacy) is medication to be sent to? [facility] - [place], [place] - [address]  3. Do they need a 30 day or 90 day supply? 90  Pharmacy need prescription for Dr Bjorn Pippin since he is the patient new dr

## 2021-01-07 NOTE — Telephone Encounter (Signed)
Refills has been sent to the pharmacy. 

## 2021-01-08 ENCOUNTER — Other Ambulatory Visit: Payer: Self-pay

## 2021-01-08 ENCOUNTER — Ambulatory Visit
Admission: RE | Admit: 2021-01-08 | Discharge: 2021-01-08 | Disposition: A | Discharge status: Home / Self Care | Payer: Self-pay | Source: Ambulatory Visit | Attending: Cardiology | Admitting: Cardiology

## 2021-01-08 DIAGNOSIS — I10 Essential (primary) hypertension: Secondary | ICD-10-CM

## 2021-01-15 ENCOUNTER — Other Ambulatory Visit: Payer: Self-pay

## 2021-01-15 ENCOUNTER — Encounter (INDEPENDENT_AMBULATORY_CARE_PROVIDER_SITE_OTHER): Payer: Self-pay | Admitting: Family Medicine

## 2021-01-15 ENCOUNTER — Ambulatory Visit (INDEPENDENT_AMBULATORY_CARE_PROVIDER_SITE_OTHER): Payer: Medicare Other | Admitting: Family Medicine

## 2021-01-15 VITALS — BP 98/66 | HR 81 | Temp 97.9°F | Ht 70.0 in | Wt 195.0 lb

## 2021-01-15 DIAGNOSIS — E559 Vitamin D deficiency, unspecified: Secondary | ICD-10-CM | POA: Diagnosis not present

## 2021-01-15 DIAGNOSIS — E669 Obesity, unspecified: Secondary | ICD-10-CM | POA: Diagnosis not present

## 2021-01-15 DIAGNOSIS — Z6832 Body mass index (BMI) 32.0-32.9, adult: Secondary | ICD-10-CM | POA: Diagnosis not present

## 2021-01-15 DIAGNOSIS — N1831 Chronic kidney disease, stage 3a: Secondary | ICD-10-CM

## 2021-01-15 MED ORDER — VITAMIN D (ERGOCALCIFEROL) 1.25 MG (50000 UNIT) PO CAPS: 50000.0000 [IU] | ORAL_CAPSULE | ORAL | 0 refills | Status: DC

## 2021-01-16 ENCOUNTER — Telehealth: Payer: Self-pay | Admitting: Cardiology

## 2021-01-16 NOTE — Telephone Encounter (Signed)
Spoke to patient he stated he wanted to know if he needs to finish Losartan before he starts Entresto.Advised he should stop Losartan.He will start taking Entresto 24/26 mg twice a day tomorrow.He will keep appointment with PharmD 10/24 at 2:00 pm.

## 2021-01-16 NOTE — Telephone Encounter (Signed)
Pt c/o medication issue:  1. Name of Medication: sacubitril-valsartan (ENTRESTO) 24-26 MG Losartan 25mg  2. How are you currently taking this medication (dosage and times per day)?   3. Are you having a reaction (difficulty breathing--STAT)? NO  4. What is your medication issue? PT WAS ADVISED TO STOP TAKING LOSARTAN AND TO START ENTRESTO, PT WANTS TO KNOW SHOULD HE FINISH HIS LAST 20 LASARTAN PILL BEFORE HE STARTS THE ENTRESTO?

## 2021-01-19 NOTE — Progress Notes (Signed)
Chief Complaint:   OBESITY [name] is here to discuss his progress with his obesity treatment plan along with follow-up of his obesity related diagnoses. [name] is on keeping a food journal and adhering to recommended goals of 1200-1600 calories and 85 grams of protein daily and states he is following his eating plan approximately 50% of the time. [name] states he is at the gym for 60 minutes 2 times per week.  Today's visit was #: 20 Starting weight: 223 lbs Starting date: 08/15/2019 Today's weight: 195 lbs Today's date: 01/15/2021 Total lbs lost to date: 28 Total lbs lost since last in-office visit: 3  Interim History: [name] continues to do well with weight oss. He is working on increasing his exercise and eating healthier overall.  Subjective:   1. Vitamin D deficiency [name] is stable on Vit D, and he denies signs of over-replacement. He is due for labs soon.  2. Chronic renal impairment, stage 3a (HCC) [name] is working on increasing his hydration. He is starting Entresto from his Cardiologist today.  Assessment/Plan:   1. Vitamin D deficiency Low Vitamin D level contributes to fatigue and are associated with obesity, breast, and colon cancer. We will refill prescription Vitamin D, and we will recheck labs in 1 month. [name] will follow-up for routine testing of Vitamin D, at least 2-3 times per year to avoid over-replacement.  - Vitamin D, Ergocalciferol, (DRISDOL) 1.25 MG (50000 UNIT) CAPS capsule; Take 1 capsule (50,000 Units total) by mouth once a week.  Dispense: 4 capsule; Refill: 0  2. Chronic renal impairment, stage 3a (HCC) [name] is to increase his water intake to at least 80 oz per day, and we will recheck labs in 1 month.  3. Obesity, Current BMI 28.0 [name] is currently in the action stage of change. As such, his goal is to continue with weight loss efforts. He has agreed to keeping a food journal and adhering to recommended goals of 1200-1600 calories and 85 grams of protein  daily.   We will recheck fasting labs at his next visit.  Exercise goals: As is.  Behavioral modification strategies: meal planning and cooking strategies.  [name] has agreed to follow-up with our clinic in 4 weeks. He was informed of the importance of frequent follow-up visits to maximize his success with intensive lifestyle modifications for his multiple health conditions.   Objective:   Blood pressure 98/66, pulse 81, temperature 97.9 F (36.6 C), height 5\' 10"  (1.778 m), weight 195 lb (88.5 kg), SpO2 96 %. Body mass index is 27.98 kg/m.  General: Cooperative, alert, well developed, in no acute distress. HEENT: Conjunctivae and lids unremarkable. Cardiovascular: Regular rhythm.  Lungs: Normal work of breathing. Neurologic: No focal deficits.   Lab Results  Component Value Date   CREATININE 1.44 (H) 12/25/2020   BUN 21 12/25/2020   NA 141 12/25/2020   K 4.5 12/25/2020   CL 103 12/25/2020   CO2 25 12/25/2020   Lab Results  Component Value Date   ALT 30 06/25/2020   AST 25 06/25/2020   ALKPHOS 71 06/25/2020   BILITOT 0.5 06/25/2020   Lab Results  Component Value Date   HGBA1C 5.7 (H) 06/25/2020   HGBA1C 5.5 02/12/2020   HGBA1C 5.9 (H) 11/13/2019   HGBA1C 6.1 (H) 08/15/2019   HGBA1C 6.1 11/28/2018   Lab Results  Component Value Date   INSULIN 6.3 06/25/2020   INSULIN 7.6 02/12/2020   INSULIN 10.2 11/13/2019   INSULIN 10.0 08/15/2019  Lab Results  Component Value Date   TSH 1.570 12/12/2020   Lab Results  Component Value Date   CHOL 136 06/25/2020   HDL 53 06/25/2020   LDLCALC 65 06/25/2020   TRIG 98 06/25/2020   CHOLHDL 2.6 06/25/2020   Lab Results  Component Value Date   VD25OH 53.4 06/25/2020   VD25OH 63.0 02/12/2020   VD25OH 52.8 11/13/2019   Lab Results  Component Value Date   WBC 4.0 12/25/2020   HGB 16.3 12/25/2020   HCT 48.4 12/25/2020   MCV 84 12/25/2020   PLT 193 12/25/2020   No results found for: IRON, TIBC, FERRITIN  Obesity  Behavioral Intervention:   Approximately 15 minutes were spent on the discussion below.  ASK: We discussed the diagnosis of obesity with [name] today and [name] agreed to give [place] permission to discuss obesity behavioral modification therapy today.  ASSESS: [name] has the diagnosis of obesity and his BMI today is 28.0. [name] is in the action stage of change.   ADVISE: [name] was educated on the multiple health risks of obesity as well as the benefit of weight loss to improve his health. He was advised of the need for long term treatment and the importance of lifestyle modifications to improve his current health and to decrease his risk of future health problems.  AGREE: Multiple dietary modification options and treatment options were discussed and [name] agreed to follow the recommendations documented in the above note.  ARRANGE: [name] was educated on the importance of frequent visits to treat obesity as outlined per CMS and USPSTF guidelines and agreed to schedule his next follow up appointment today.  Attestation Statements:   Reviewed by clinician on day of visit: allergies, medications, problem list, medical history, surgical history, family history, social history, and previous encounter notes.   I, Burt Knack, am acting as transcriptionist for Quillian Quince, MD.  I have reviewed the above documentation for accuracy and completeness, and I agree with the above. -  Quillian Quince, MD

## 2021-01-29 ENCOUNTER — Ambulatory Visit: Payer: Medicare Other

## 2021-02-02 ENCOUNTER — Ambulatory Visit (INDEPENDENT_AMBULATORY_CARE_PROVIDER_SITE_OTHER): Payer: Medicare Other | Admitting: Pharmacist Clinician (PhC)/ Clinical Pharmacy Specialist

## 2021-02-02 ENCOUNTER — Other Ambulatory Visit: Payer: Self-pay

## 2021-02-02 DIAGNOSIS — I5022 Chronic systolic (congestive) heart failure: Secondary | ICD-10-CM

## 2021-02-02 NOTE — Progress Notes (Signed)
02/03/2021 [name] 1945/05/12 [id number]   HPI:  [name] is a 75 y.o. male patient of Dr Bjorn Pippin, with a PMH below who presents today for heart failure medication titration.  Nuclear stress test on 8/26 showed EF at 12%.  He was seen last month in CV[facility] by Laural Golden PharmD, at which time losartan was switched to Entresto 24/26 mg bid.  He notes doing well with the medication and feels as though he is doing better.  States has a little more energy and stamina and is quite happy with this improvement.  Has taken up gardening since his retirement and recently planted fall vegetables without any restrictions/constraints.    Past Medical History: hypertension Controlled with HF medications  CAD 9/22 - Coronary calcium score 1852 (72nd percentile)  Hyperlipidemia 3/22 - LDL 65 - on atorvastatin 10 mg   CKD GFR 50 (with ABW)  preDM 3/22 - A1c 5.7 (has been up to 6.4 in 2019) - starting Farxiga today     Blood Pressure Goal:  130/80  Current Medications:Entresto 24/26 mg bid, carvedilol 6.25 mg bid, spironolactone 12.5 mg qd, furosemide 20 mg bid  Family Hx: mother died at 51, father at 59, both from heart disease; sister also with CHF (and on [place])  Social Hx: no alcohol, no tobacco,  occasional morning tea  Diet: followed by [facility] (down 28 pounds from start);  eliminated salt from diet  Exercise: gym 60 min twice weekly (30 min on treadmill, 30 min modest weigh training)  Home BP readings: home 118/75; range 132 highest, lowest >110 by recall.    Intolerances: lisinopirl - angioedema  Labs: 12/25/20:  Na 141, K 4.5, Glu 74, BUN 21, SCr 1.44, GFR 51   Wt Readings from Last 3 Encounters:  02/02/21 204 lb (92.5 kg)  01/15/21 195 lb (88.5 kg)  01/01/21 208 lb 6.4 oz (94.5 kg)   BP Readings from Last 3 Encounters:  02/02/21 100/72  01/15/21 98/66  01/01/21 (!) 132/94   Pulse Readings from Last 3 Encounters:  02/02/21 76  01/15/21 81   01/01/21 87    Current Outpatient Medications  Medication Sig Dispense Refill   aspirin EC 81 MG tablet Take 1 tablet (81 mg total) by mouth daily. 90 tablet 3   atorvastatin (LIPITOR) 10 MG tablet TAKE 1 TABLET BY MOUTH ONCE DAILY AT  6PM 90 tablet 0   carvedilol (COREG) 6.25 MG tablet Take 1 tablet (6.25 mg total) by mouth 2 (two) times daily with a meal. 60 tablet 5   Coenzyme Q10-Vitamin E (QUNOL ULTRA COQ10 PO) Take by mouth.     COVID-19 mRNA bivalent vaccine, Pfizer, injection Inject into the muscle. 0.3 mL 0   furosemide (LASIX) 20 MG tablet Take 1 tablet (20 mg total) by mouth 2 (two) times daily. 180 tablet 2   influenza vaccine adjuvanted (FLUAD QUADRIVALENT) 0.5 ML injection Inject into the muscle. 0.5 mL 0   Multiple Vitamins-Minerals (PRESERVISION AREDS 2+MULTI VIT PO) Take 1 tablet by mouth daily.     potassium chloride (KLOR-CON) 10 MEQ tablet TAKE 1  BY MOUTH ONCE DAILY 90 tablet 0   sacubitril-valsartan (ENTRESTO) 24-26 MG Take 1 tablet by mouth 2 (two) times daily. 60 tablet 0   sildenafil (REVATIO) 20 MG tablet Take 1 tablet (20 mg total) by mouth 3 (three) times daily. 30 tablet 2   spironolactone (ALDACTONE) 25 MG tablet Take 1/2 (one-half) tablet by mouth once daily  45 tablet 2   Vitamin D, Ergocalciferol, (DRISDOL) 1.25 MG (50000 UNIT) CAPS capsule Take 1 capsule (50,000 Units total) by mouth once a week. 4 capsule 0   No current facility-administered medications for this visit.    Allergies  Allergen Reactions   Lisinopril Swelling    ANGIOEDEMA    Past Medical History:  Diagnosis Date   Alcohol abuse    Anomalous right coronary artery    Chronic systolic CHF (congestive heart failure) (HCC) 08/18/2016   Echo 1/17:  Diff HK especially in inf wall, mild LVH, EF 30-35, mild AS (mean 10, peak 18) // Echo 11/15: Mild LVH, EF 30-35, moderate HK, Gr 1 DD, mild AI, mild RAE // Echo 8/18: Mild LVH, EF 30-35, diffuse HK, mild aortic stenosis (mean 7), mild AI, mild  BAE   Coronary artery calcification 08/18/2016   Cor CTA 1/08: Ca score 248 // LHC 1/08: oLAD 25%   History of ETOH abuse    Hyperlipidemia 08/27/2013   Hypertension    Lactose intolerance    Non-ischemic cardiomyopathy (HCC)    Presumed secondary to Etoh and HTN  (Prev EF 20-25% -- improved to 50%) // LHC 1/08: oLAD 25, EF 25%    OSA (obstructive sleep apnea) 10/17/2013   Severe per home sleep study performed on 09/26/13.    Sleep apnea    SOB (shortness of breath)    Vitamin D deficiency     Blood pressure 100/72, pulse 76, height 5\' 10"  (1.778 m), weight 204 lb (92.5 kg).  Chronic systolic CHF (congestive heart failure) (HCC) Patient with HFrEF (at 12%) currently on Entresto, carvedilol, spironolactone and furosemide.  Discussed the current guidelines for treating HFrEF and will add Farxiga 10 mg to his current regimen.  Unsure about his costs for branded medications, but stressed that if and Sherryll Burger are cost prohibitive, he needs to be sure to let Marcelline Deist know at follow up.  He was given 4 weeks of samples of the Farxiga, dosing, mechanism of action and side effects were reviewed.  He is scheduled to see Dr. [place] next month and will let him know of any concerns at that time.     Bjorn Pippin PharmD CPP [facility] [address] [place], [place] [id number]

## 2021-02-02 NOTE — Patient Instructions (Addendum)
Return for a a follow up appointment with Dr. Bjorn Pippin next month  Check your blood pressure at home daily  and keep record of the readings.  Take your meds as follows:   Add Farxiga 10 mg once daily  Continue with all your other medications   Bring all of your meds, your BP cuff and your record of home blood pressures to your next appointment.  Exercise as you're able, try to walk approximately 30 minutes per day.  Keep salt intake to a minimum, especially watch canned and prepared boxed foods.  Eat more fresh fruits and vegetables and fewer canned items.  Avoid eating in fast food restaurants.    HOW TO TAKE YOUR BLOOD PRESSURE: Rest 5 minutes before taking your blood pressure.  Don't smoke or drink caffeinated beverages for at least 30 minutes before. Take your blood pressure before (not after) you eat. Sit comfortably with your back supported and both feet on the floor (don't cross your legs). Elevate your arm to heart level on a table or a desk. Use the proper sized cuff. It should fit smoothly and snugly around your bare upper arm. There should be enough room to slip a fingertip under the cuff. The bottom edge of the cuff should be 1 inch above the crease of the elbow. Ideally, take 3 measurements at one sitting and record the average.

## 2021-02-03 ENCOUNTER — Telehealth (HOSPITAL_COMMUNITY): Payer: Self-pay | Admitting: Emergency Medicine

## 2021-02-03 ENCOUNTER — Encounter: Payer: Self-pay | Admitting: Pharmacist Clinician (PhC)/ Clinical Pharmacy Specialist

## 2021-02-03 MED ORDER — DAPAGLIFLOZIN PROPANEDIOL 10 MG PO TABS: 10.0000 mg | ORAL_TABLET | Freq: Every day | ORAL | 3 refills | Status: AC

## 2021-02-03 NOTE — Assessment & Plan Note (Signed)
Patient with HFrEF (at 12%) currently on Entresto, carvedilol, spironolactone and furosemide.  Discussed the current guidelines for treating HFrEF and will add Farxiga 10 mg to his current regimen.  Unsure about his costs for branded medications, but stressed that if Sherryll Burger and Marcelline Deist are cost prohibitive, he needs to be sure to let us know at follow up.  He was given 4 weeks of samples of the Farxiga, dosing, mechanism of action and side effects were reviewed.  He is scheduled to see Dr. Bjorn Pippin next month and will let him know of any concerns at that time.

## 2021-02-03 NOTE — Telephone Encounter (Signed)
Reaching out to patient to offer assistance regarding upcoming cardiac imaging study; pt verbalizes understanding of appt date/time, parking situation and where to check in, and verified current allergies; name and call back number provided for further questions should they arise Rockwell Alexandria RN Navigator Cardiac Imaging [facility] [phone] office [phone] cell  Denies metal implants Denies claustro Denies iv issues Holding diuretics

## 2021-02-04 ENCOUNTER — Ambulatory Visit (HOSPITAL_COMMUNITY)
Admission: RE | Admit: 2021-02-04 | Discharge: 2021-02-04 | Disposition: A | Discharge status: Home / Self Care | Payer: Medicare Other | Source: Ambulatory Visit | Attending: Cardiology | Admitting: Cardiology

## 2021-02-04 ENCOUNTER — Other Ambulatory Visit: Payer: Self-pay

## 2021-02-04 DIAGNOSIS — I5042 Chronic combined systolic (congestive) and diastolic (congestive) heart failure: Secondary | ICD-10-CM | POA: Insufficient documentation

## 2021-02-04 MED ORDER — GADOBUTROL 1 MMOL/ML IV SOLN
12.0000 mL | Freq: Once | INTRAVENOUS | Status: AC | PRN
  Administered 2021-02-04: 12 mL via INTRAVENOUS

## 2021-02-05 ENCOUNTER — Telehealth: Payer: Self-pay | Admitting: *Deleted

## 2021-02-05 NOTE — Telephone Encounter (Signed)
Spoke with the patient and gave him Dr.Jacobs' recommendations. Made office visit 12/7. Colon & PV cancelled-pt is aware.

## 2021-02-05 NOTE — Telephone Encounter (Signed)
Dr.Jacobs,  Please review. EF low <20% per echo in August, but patient did have MRI yesterday. Patient has + cologuard. Ok for recall colonoscopy at [facility] or OV or hospital colon?

## 2021-02-06 ENCOUNTER — Telehealth: Payer: Self-pay

## 2021-02-06 NOTE — Telephone Encounter (Deleted)
**Note De-Identified Lareina Espino Obfuscation** Marcelline Deist PA started through covermymds. Key: [id number]

## 2021-02-06 NOTE — Telephone Encounter (Signed)
**Note De-Identified Sugar Vanzandt Obfuscation** Alan Deist PA started through covermymds and the following message was received:  [facility] Key: [id number] - PA Case ID: [id number] - Rx #: [id number] Outcome: Approved today Your PA request has been approved. Additional information will be provided in the approval communication.  Drug: [name] 10MG  tablets Form: PA Form (2017 NCPDP)   I have notified [facility] of this approval.

## 2021-02-06 NOTE — Telephone Encounter (Signed)
**Note De-identified Argentina Kosch Obfuscation** -----  **Note De-Identified Emmily Pellegrin Obfuscation** Message from Harvel Ricks, RN sent at 02/06/2021 10:35 AM EDT ----- Regarding: PA-Farxiga PA needed for Farxiga 10 mg tablet  Key: [id number]  Thanks!

## 2021-02-16 ENCOUNTER — Encounter (INDEPENDENT_AMBULATORY_CARE_PROVIDER_SITE_OTHER): Payer: Self-pay | Admitting: Family Medicine

## 2021-02-16 ENCOUNTER — Other Ambulatory Visit: Payer: Self-pay

## 2021-02-16 ENCOUNTER — Ambulatory Visit (INDEPENDENT_AMBULATORY_CARE_PROVIDER_SITE_OTHER): Payer: Medicare Other | Admitting: Family Medicine

## 2021-02-16 VITALS — BP 99/65 | HR 87 | Temp 97.6°F | Ht 70.0 in | Wt 195.0 lb

## 2021-02-16 DIAGNOSIS — Z6832 Body mass index (BMI) 32.0-32.9, adult: Secondary | ICD-10-CM

## 2021-02-16 DIAGNOSIS — E785 Hyperlipidemia, unspecified: Secondary | ICD-10-CM | POA: Diagnosis not present

## 2021-02-16 DIAGNOSIS — I5022 Chronic systolic (congestive) heart failure: Secondary | ICD-10-CM | POA: Diagnosis not present

## 2021-02-16 DIAGNOSIS — R7303 Prediabetes: Secondary | ICD-10-CM | POA: Diagnosis not present

## 2021-02-16 DIAGNOSIS — E559 Vitamin D deficiency, unspecified: Secondary | ICD-10-CM

## 2021-02-16 DIAGNOSIS — E669 Obesity, unspecified: Secondary | ICD-10-CM

## 2021-02-16 MED ORDER — VITAMIN D (ERGOCALCIFEROL) 1.25 MG (50000 UNIT) PO CAPS: 50000.0000 [IU] | ORAL_CAPSULE | ORAL | 0 refills | Status: DC

## 2021-02-16 NOTE — Progress Notes (Signed)
Chief Complaint:   OBESITY [name] is here to discuss his progress with his obesity treatment plan along with follow-up of his obesity related diagnoses. [name] is on keeping a food journal and adhering to recommended goals of 1200-1600 calories and 85 grams of protein daily and states he is following his eating plan approximately 20% of the time. Curley states he is doing 0 minutes 0 times per week.  Today's visit was #: 21 Starting weight: 223 lbs Starting date: 08/15/2019 Today's weight: 195 lbs Today's date: 02/16/2021 Total lbs lost to date: 28 Total lbs lost since last in-office visit: 0  Interim History: [name] has done well with maintaining his weight since last month. He is open to discussing holiday and travel strategies.  Subjective:   1. Pre-diabetes [name] is working on diet, exercise, and weight loss. He is on Farixga for cardio protection.  2. Vitamin D deficiency [name] is on Vit D, and he is due for labs.  3. Chronic systolic CHF (congestive heart failure) ([name]) [name] is on [place]. He is working on diet and exercise, and he is due for labs.   4. Hyperlipidemia, unspecified hyperlipidemia type [name] is working on diet, exercise, and weight loss, and he is due for labs.  Assessment/Plan:   1. Pre-diabetes [name] will continue to work on diet, exercise, and decreasing simple carbohydrates to help decrease the risk of diabetes. We will check labs today.  - CMP14+EGFR - Insulin, random - Hemoglobin A1c  2. Vitamin D deficiency Low Vitamin D level contributes to fatigue and are associated with obesity, breast, and colon cancer. We will refill prescription Vitamin D for 1 month. We will check labs today, and Devanta will follow-up for routine testing of Vitamin D, at least 2-3 times per year to avoid over-replacement.  - Vitamin D, Ergocalciferol, (DRISDOL) 1.25 MG (50000 UNIT) CAPS capsule; Take 1 capsule (50,000 Units total) by mouth once a week.  Dispense: 4  capsule; Refill: 0 - VITAMIN D 25 Hydroxy (Vit-D Deficiency, Fractures)  3. Chronic systolic CHF (congestive heart failure) (HCC) We will check labs today. [name] will continue to increase his water intake, and will continue to follow up ad directed.  4. Hyperlipidemia, unspecified hyperlipidemia type Cardiovascular risk and specific lipid/LDL goals reviewed. We discussed several lifestyle modifications today. We will check labs today. Theoren will continue to work on diet, exercise and weight loss efforts. Orders and follow up as documented in patient record.   - Lipid Panel With LDL/HDL Ratio  5. Obesity BMI today is 19 [name] is currently in the action stage of change. As such, his goal is to continue with weight loss efforts. He has agreed to keeping a food journal and adhering to recommended goals of 1200-1600 calories and 85+ grams of protein daily.   Behavioral modification strategies: travel eating strategies and holiday eating strategies .  [name] has agreed to follow-up with our clinic in 3 to 4 weeks. He was informed of the importance of frequent follow-up visits to maximize his success with intensive lifestyle modifications for his multiple health conditions.   [name] was informed we would discuss his lab results at his next visit unless there is a critical issue that needs to be addressed sooner. [name] agreed to keep his next visit at the agreed upon time to discuss these results.  Objective:   Blood pressure 99/65, pulse 87, temperature 97.6 F (36.4 C), height _0  (1.778 m), weight 195 lb (88.5 kg), SpO2 98 %.  Body mass index is 27.98 kg/m.  General: Cooperative, alert, well developed, in no acute distress. HEENT: Conjunctivae and lids unremarkable. Cardiovascular: Regular rhythm.  Lungs: Normal work of breathing. Neurologic: No focal deficits.   Lab Results  Component Value Date   CREATININE 1.44 (H) 12/25/2020   BUN 21 12/25/2020   NA 141 12/25/2020   K 4.5 12/25/2020    CL 103 12/25/2020   CO2 25 12/25/2020   Lab Results  Component Value Date   ALT 30 06/25/2020   AST 25 06/25/2020   ALKPHOS 71 06/25/2020   BILITOT 0.5 06/25/2020   Lab Results  Component Value Date   HGBA1C 5.7 (H) 06/25/2020   HGBA1C 5.5 02/12/2020   HGBA1C 5.9 (H) 11/13/2019   HGBA1C 6.1 (H) 08/15/2019   HGBA1C 6.1 11/28/2018   Lab Results  Component Value Date   INSULIN 6.3 06/25/2020   INSULIN 7.6 02/12/2020   INSULIN 10.2 11/13/2019   INSULIN 10.0 08/15/2019   Lab Results  Component Value Date   TSH 1.570 12/12/2020   Lab Results  Component Value Date   CHOL 136 06/25/2020   HDL 53 06/25/2020   LDLCALC 65 06/25/2020   TRIG 98 06/25/2020   CHOLHDL 2.6 06/25/2020   Lab Results  Component Value Date   VD25OH 53.4 06/25/2020   VD25OH 63.0 02/12/2020   VD25OH 52.8 11/13/2019   Lab Results  Component Value Date   WBC 4.0 12/25/2020   HGB 16.3 12/25/2020   HCT 48.4 12/25/2020   MCV 84 12/25/2020   PLT 193 12/25/2020   No results found for: IRON, TIBC, FERRITIN  Obesity Behavioral Intervention:   Approximately 15 minutes were spent on the discussion below.  ASK: We discussed the diagnosis of obesity with [name] today and [name] agreed to give [name] permission to discuss obesity behavioral modification therapy today.  ASSESS: [name] has the diagnosis of obesity and his BMI today is 28.1. [name] is in the action stage of change.   ADVISE: [name] was educated on the multiple health risks of obesity as well as the benefit of weight loss to improve his health. He was advised of the need for long term treatment and the importance of lifestyle modifications to improve his current health and to decrease his risk of future health problems.  AGREE: Multiple dietary modification options and treatment options were discussed and [name] agreed to follow the recommendations documented in the above note.  ARRANGE: [name] was educated on the importance of frequent visits to treat  obesity as outlined per CMS and USPSTF guidelines and agreed to schedule his next follow up appointment today.  Attestation Statements:   Reviewed by clinician on day of visit: allergies, medications, problem list, medical history, surgical history, family history, social history, and previous encounter notes.   I, Trixie Dredge, am acting as transcriptionist for Dennard Nip, MD.  I have reviewed the above documentation for accuracy and completeness, and I agree with the above. -  Dennard Nip, MD

## 2021-02-17 ENCOUNTER — Other Ambulatory Visit: Payer: Self-pay | Admitting: Cardiology

## 2021-02-17 ENCOUNTER — Telehealth: Payer: Self-pay

## 2021-02-17 ENCOUNTER — Other Ambulatory Visit: Payer: Self-pay | Admitting: Family

## 2021-02-17 DIAGNOSIS — I5022 Chronic systolic (congestive) heart failure: Secondary | ICD-10-CM

## 2021-02-17 LAB — CMP14+EGFR
ALT: 11 IU/L (ref 0–44)
AST: 18 IU/L (ref 0–40)
Albumin/Globulin Ratio: 1.1 — ABNORMAL LOW (ref 1.2–2.2)
Albumin: 4.2 g/dL (ref 3.7–4.7)
Alkaline Phosphatase: 71 IU/L (ref 44–121)
BUN/Creatinine Ratio: 13 (ref 10–24)
BUN: 16 mg/dL (ref 8–27)
Bilirubin Total: 0.9 mg/dL (ref 0.0–1.2)
CO2: 25 mmol/L (ref 20–29)
Calcium: 9.5 mg/dL (ref 8.6–10.2)
Chloride: 100 mmol/L (ref 96–106)
Creatinine, Ser: 1.24 mg/dL (ref 0.76–1.27)
Globulin, Total: 3.7 g/dL (ref 1.5–4.5)
Glucose: 86 mg/dL (ref 70–99)
Potassium: 4.4 mmol/L (ref 3.5–5.2)
Sodium: 139 mmol/L (ref 134–144)
Total Protein: 7.9 g/dL (ref 6.0–8.5)
eGFR: 61 mL/min/{1.73_m2} (ref >59)

## 2021-02-17 LAB — HEMOGLOBIN A1C
Est. average glucose Bld gHb Est-mCnc: 120 mg/dL
Hgb A1c MFr Bld: 5.8 % — ABNORMAL HIGH (ref 4.8–5.6)

## 2021-02-17 LAB — LIPID PANEL WITH LDL/HDL RATIO
Cholesterol, Total: 136 mg/dL (ref 100–199)
HDL: 52 mg/dL (ref >39)
LDL Chol Calc (NIH): 71 mg/dL (ref 0–99)
LDL/HDL Ratio: 1.4 ratio (ref 0.0–3.6)
Triglycerides: 65 mg/dL (ref 0–149)
VLDL Cholesterol Cal: 13 mg/dL (ref 5–40)

## 2021-02-17 LAB — INSULIN, RANDOM: INSULIN: 14.9 u[IU]/mL (ref 2.6–24.9)

## 2021-02-17 LAB — VITAMIN D 25 HYDROXY (VIT D DEFICIENCY, FRACTURES): Vit D, 25-Hydroxy: 58.8 ng/mL (ref 30.0–100.0)

## 2021-02-17 MED ORDER — ENTRESTO 24-26 MG PO TABS: 1.0000 | ORAL_TABLET | Freq: Two times a day (BID) | ORAL | 0 refills | Status: DC

## 2021-02-17 NOTE — Telephone Encounter (Signed)
Received a call from the patient requesting a refill on his Entresto. He states he will be going out of town and requested to have his rx sent to [facility] in [place], [place].   Chart reviewed.   Rx(s) sent to pharmacy electronically.  Patient voiced understanding.

## 2021-02-22 NOTE — H&P (View-Only) (Signed)
Cardiology Office Note:    Date:  02/26/2021   ID:  [name], DOB 29-Sep-1945, MRN [id number]  PCP:  Debbrah Alar, NP  Cardiologist:  Dorris Carnes, MD  Electrophysiologist:  None   Referring MD: Debbrah Alar, NP   Chief Complaint  Patient presents with   Congestive Heart Failure      History of Present Illness:    [name] is a 75 y.o. male with a hx of nonischemic cardiomyopathy, hypertension, OSA, alcohol abuse, hyperlipidemia who presents for follow-up.  Previously followed with Dr. Harrington Challenger.  Coronary CTA in 2008 showed anomalous RCA off the left main with scores between pulmonary artery and aorta, calcified plaque in proximal LAD causing less than 50% stenosis, calcium score to 48.  Echocardiogram 12/15/2018 showed EF 35 to 40%.  Echocardiogram on 12/05/2020 showed EF less than 123456, grade 2 diastolic dysfunction, mild RV dysfunction, moderate left atrial dilatation, severe right atrial dilatation, possible low-flow low gradient severe aortic stenosis.  Lexiscan Myoview on 12/05/2020 showed EF 12%, normal perfusion.  Cardiac MRI on 02/04/2021 showed severe LV dilatation, EF 15%, mid myocardial LGE, RV EF 43%, moderate MR (regurgitant fraction 27%).  Since last clinic visit, he reports that he has been doing well.  Walked 4 miles recently and had no symptoms.  Denies any chest pain, dyspnea, light, syncope, LE edema, palpitations.    Wt Readings from Last 3 Encounters:  02/24/21 202 lb 9.6 oz (91.9 kg)  02/16/21 195 lb (88.5 kg)  02/02/21 204 lb (92.5 kg)     Past Medical History:  Diagnosis Date   Alcohol abuse    Anomalous right coronary artery    Chronic systolic CHF (congestive heart failure) ([place]) 08/18/2016   Echo 1/17:  Diff HK especially in inf wall, mild LVH, EF 30-35, mild AS (mean 10, peak 18) // Echo 11/15: Mild LVH, EF 30-35, moderate HK, Gr 1 DD, mild AI, mild RAE // Echo 8/18: Mild LVH, EF 30-35, diffuse HK, mild aortic stenosis (mean 7), mild  AI, mild BAE   Coronary artery calcification 08/18/2016   Cor CTA 1/08: Ca score 248 // LHC 1/08: oLAD 25%   History of ETOH abuse    Hyperlipidemia 08/27/2013   Hypertension    Lactose intolerance    Non-ischemic cardiomyopathy ([place])    Presumed secondary to Etoh and HTN  (Prev EF 20-25% -- improved to 50%) // LHC 1/08: oLAD 25, EF 25%    OSA (obstructive sleep apnea) 10/17/2013   Severe per home sleep study performed on 09/26/13.    Sleep apnea    SOB (shortness of breath)    Vitamin D deficiency     No past surgical history on file.  Current Medications: Current Meds  Medication Sig   aspirin EC 81 MG tablet Take 1 tablet (81 mg total) by mouth daily.   carvedilol (COREG) 6.25 MG tablet TAKE 1 TABLET BY MOUTH TWICE DAILY WITH A MEAL   Coenzyme Q10-Vitamin E (QUNOL ULTRA COQ10 PO) Take by mouth.   COVID-19 mRNA bivalent vaccine, Pfizer, injection Inject into the muscle.   dapagliflozin propanediol (FARXIGA) 10 MG TABS tablet Take 1 tablet (10 mg total) by mouth daily before breakfast.   influenza vaccine adjuvanted (FLUAD QUADRIVALENT) 0.5 ML injection Inject into the muscle.   losartan (COZAAR) 25 MG tablet Take 1 tablet (25 mg total) by mouth daily.   Multiple Vitamins-Minerals (PRESERVISION AREDS 2+MULTI VIT PO) Take 1 tablet by mouth daily.   potassium chloride (KLOR-CON)  10 MEQ tablet TAKE 1  BY MOUTH ONCE DAILY   sildenafil (REVATIO) 20 MG tablet Take 1 tablet (20 mg total) by mouth 3 (three) times daily.   spironolactone (ALDACTONE) 25 MG tablet Take 1/2 (one-half) tablet by mouth once daily   Vitamin D, Ergocalciferol, (DRISDOL) 1.25 MG (50000 UNIT) CAPS capsule Take 1 capsule (50,000 Units total) by mouth once a week.   [DISCONTINUED] atorvastatin (LIPITOR) 10 MG tablet TAKE 1 TABLET BY MOUTH ONCE DAILY AT  6PM   [DISCONTINUED] furosemide (LASIX) 20 MG tablet Take 1 tablet (20 mg total) by mouth 2 (two) times daily.   [DISCONTINUED] sacubitril-valsartan (ENTRESTO) 24-26 MG  Take 1 tablet by mouth 2 (two) times daily.     Allergies:   Lisinopril   Social History   Socioeconomic History   Marital status: Married    Spouse name: [name]   Number of children: 3   Years of education: Not on file   Highest education level: Not on file  Occupational History   Occupation: Retired Radiographer, therapeutic  Tobacco Use   Smoking status: Former    Years: 20.00    Types: Cigarettes    Quit date: 04/12/2002    Years since quitting: 18.8   Smokeless tobacco: Never  Vaping Use   Vaping Use: Never used  Substance and Sexual Activity   Alcohol use: Not Currently    Alcohol/week: 0.0 standard drinks   Drug use: No    Comment: history of marijuana use   Sexual activity: Yes  Other Topics Concern   Not on file  Social History Narrative   2 sons and daughter   1 son in Hotel manager. Living with wife 53yrs [name]).  Use to live near [place].   Social Determinants of Health   Financial Resource Strain: Not on file  Food Insecurity: Not on file  Transportation Needs: No Transportation Needs   Lack of Transportation (Medical): No   Lack of Transportation (Non-Medical): No  Physical Activity: Inactive   Days of Exercise per Week: 0 days   Minutes of Exercise per Session: 0 min  Stress: Not on file  Social Connections: Moderately Isolated   Frequency of Communication with Friends and Family: More than three times a week   Frequency of Social Gatherings with Friends and Family: More than three times a week   Attends Religious Services: Never   Database administrator or Organizations: No   Attends Engineer, structural: Never   Marital Status: Married     Family History: The patient's family history includes Alcohol abuse in his father and mother; Heart disease in his maternal grandmother and mother; Heart failure in his father; High Cholesterol in his mother; High blood pressure in his mother; Stroke in his maternal grandmother; Sudden death in his mother. There is no history  of Diabetes.  ROS:   Please see the history of present illness.     All other systems reviewed and are negative.  EKGs/Labs/Other Studies Reviewed:    The following studies were reviewed today:   EKG:   02/24/21: NSR, first degree AV block, rate 74, nonspecific interventricular conduction delay, PVC   Recent Labs: 12/12/2020: TSH 1.570 02/16/2021: ALT 11 02/24/2021: BUN 17; Creatinine, Ser 1.24; Hemoglobin 16.5; Magnesium 2.3; Platelets 244; Potassium 5.0; Sodium 141  Recent Lipid Panel    Component Value Date/Time   CHOL 136 02/16/2021 0903   TRIG 65 02/16/2021 0903   HDL 52 02/16/2021 0903   CHOLHDL 2.6 06/25/2020 5956  CHOLHDL 3 04/16/2019 0738   VLDL 28.0 04/16/2019 0738   LDLCALC 71 02/16/2021 0903    Physical Exam:    VS:  BP 116/68    Pulse 85    Ht 5\' 10"  (1.778 m)    Wt 202 lb 9.6 oz (91.9 kg)    SpO2 97%    BMI 29.07 kg/m     Wt Readings from Last 3 Encounters:  02/24/21 202 lb 9.6 oz (91.9 kg)  02/16/21 195 lb (88.5 kg)  02/02/21 204 lb (92.5 kg)     GEN:  Well nourished, well developed in no acute distress HEENT: Normal NECK: No JVD; No carotid bruits CARDIAC: Irregular, normal rate, no murmurs, rubs, gallops RESPIRATORY:  Clear to auscultation without rales, wheezing or rhonchi  ABDOMEN: Soft, non-tender, non-distended MUSCULOSKELETAL:  No edema; No deformity  SKIN: Warm and dry NEUROLOGIC:  Alert and oriented x 3 PSYCHIATRIC:  Normal affect   ASSESSMENT:    1. Chronic combined systolic and diastolic heart failure ([place])   2. Aortic valve stenosis, etiology of cardiac valve disease unspecified   3. Essential hypertension   4. Medication management   5. Liver cyst   6. Hyperlipidemia, unspecified hyperlipidemia type      PLAN:    Chronic combined systolic and diastolic heart failure: Coronary CTA in 2008 showed anomalous RCA off the left main with scores between pulmonary artery and aorta, calcified plaque in proximal LAD causing less than  50% stenosis, calcium score 248.  Echocardiogram 12/15/2018 showed EF 35 to 40%..  Echocardiogram 12/15/2018 showed EF 35 to 40%.  Echocardiogram on 12/05/2020 showed EF less than 123456, grade 2 diastolic dysfunction, mild RV dysfunction, moderate left atrial dilatation, severe right atrial dilatation, possible low-flow low gradient severe aortic stenosis.  Lexiscan Myoview on 12/05/2020 showed EF 12%, normal perfusion.  Cardiac MRI on 02/04/2021 showed severe LV dilatation, EF 15%, mid myocardial LGE, RV EF 43%, moderate MR (regurgitant fraction 27%). -History of angioedema with lisinopril.  Started him on losartan 25 mg daily.  Appears he has been switched to [facility], though this is contraindicated in patients with prior angioedema on lisinopril.  Discussed with pharmacy, will switch back to losartan -Continue carvedilol 6.25 mg twice daily -Continue spironolactone 12.5 mg daily -Continue Farxiga 10 mg daily -Continue Lasix, will reduce dose to 20 mg daily.  Appears euvolemic -Recommend RHC/LHC for further evaluation.  His Myoview was unremarkable but given markedly elevated calcium score and severe systolic dysfunction, concern could have balanced ischemia and recommend definitive evaluation with cardiac catheterization.  In addition there was concern for severe AS on his echocardiogram; however AV calcium score suggests only mild calcifications.  Will plan to measure gradients through valve on catheterization -Risks and benefits of cardiac catheterization have been discussed with the patient.  These include bleeding, infection, kidney damage, stroke, heart attack, death.  The patient understands these risks and is willing to proceed.  ?Aortic stenosis: echocardiogram on 12/05/2020 possible low-flow low gradient severe aortic stenosis, though mean gradient only 7 mmHg.  Aortic valve calcium score was only 327, not consistent with severe AS.  Will evaluate further on LHC as above  Anomalous RCA off left main  with interarterial course: Lexiscan Myoview on 12/05/2020 showed EF 12%, normal perfusion.  CAD: Calcium score 1852 on 01/08/2021 (72nd percentile).  Lexiscan Myoview on 12/05/2020 showed normal perfusion. -Continue aspirin 81 mg daily -Continue atorvastatin 10 mg daily -Plan LHC as above  PVCs: Noted on EKG in clinic, however Zio patch  x3 days on 11/27/2020 showed 2.4% PVC burden  Hypertension: Continue carvedilol 6.25 mg twice daily and spironolactone 12.5 mg daily and losartan  Hyperlipidemia: Continue atorvastatin 10 mg daily.  LDL 65 on 06/25/2020  OSA: on CPAP  Possible liver cyst: Noted on MRI, will evaluate further with RUQ [place]   RTC in 2 months    Medication Adjustments/Labs and Tests Ordered: Current medicines are reviewed at length with the patient today.  Concerns regarding medicines are outlined above.  Orders Placed This Encounter  Procedures   US Abdomen Limited RUQ (LIVER/GB)   Basic metabolic panel   CBC   Magnesium   EKG 12-Lead      Meds ordered this encounter  Medications   furosemide (LASIX) 20 MG tablet    Sig: Take 1 tablet (20 mg total) by mouth daily.    Dispense:  90 tablet    Refill:  3    Decreased to daily   losartan (COZAAR) 25 MG tablet    Sig: Take 1 tablet (25 mg total) by mouth daily.    Dispense:  90 tablet    Refill:  3    STOP Entresto      Patient Instructions  Medication Instructions:  STOP Entresto START Losartan 25 mg daily DECREASE Lasix to 20 mg once daily  *If you need a refill on your cardiac medications before your next appointment, please call your pharmacy*   Lab Work: BMET, CBC, Mag today  If you have labs (blood work) drawn today and your tests are completely normal, you will receive your results only by: [place] (if you have MyChart) OR A paper copy in the mail If you have any lab test that is abnormal or we need to change your treatment, we will call you to review the  results.   Testing/Procedures: Your physician has requested that you have a cardiac catheterization. Cardiac catheterization is used to diagnose and/or treat various heart conditions. Doctors may recommend this procedure for a number of different reasons. The most common reason is to evaluate chest pain. Chest pain can be a symptom of coronary artery disease (CAD), and cardiac catheterization can show whether plaque is narrowing or blocking your hearts arteries. This procedure is also used to evaluate the valves, as well as measure the blood flow and oxygen levels in different parts of your heart. For further information please visit [email or web address]. Please follow instruction sheet, as given.  RUQ Ultrasound-at [facility] --they will call you to schedule this  Follow-Up: At [facility], you and your health needs are our priority.  As part of our continuing mission to provide you with exceptional heart care, we have created designated Provider Care Teams.  These Care Teams include your primary Cardiologist (physician) and Advanced Practice Providers (APPs -  Physician Assistants and Nurse Practitioners) who all work together to provide you with the care you need, when you need it.  We recommend signing up for the patient portal called "MyChart".  Sign up information is provided on this After Visit Summary.  MyChart is used to connect with patients for Virtual Visits (Telemedicine).  Patients are able to view lab/test results, encounter notes, upcoming appointments, etc.  Non-urgent messages can be sent to your provider as well.   To learn more about what you can do with MyChart, go to [email or web address].    Your next appointment:   2 weeks with pharmD 2 months with Dr. Gardiner Rhyme   Other Instructions   [facility]  [place] [address] Dept: [phone] Loc:  [phone]  [name]  02/24/2021  You are scheduled for a Cardiac Catheterization on Thursday, December 15 with Dr. Lauree Chandler.  1. Please arrive at the [facility] (Main Entrance A) at [facility]: [address] [place], [place] [address] at 5:30 AM (This time is two hours before your procedure to ensure your preparation). Free valet parking service is available.   Special note: Every effort is made to have your procedure done on time. Please understand that emergencies sometimes delay scheduled procedures.  2. Diet: Do not eat solid foods after midnight.  The patient may have clear liquids until 5am upon the day of the procedure.  3. Labs: Today in office   4. Medication instructions in preparation for your procedure:   Contrast Allergy: No  Hold Farxiga, Lasix, Spironolactone AM of procedure  On the morning of your procedure, take your Aspirin and any morning medicines NOT listed above.  You may use sips of water.  5. Plan for one night stay--bring personal belongings. 6. Bring a current list of your medications and current insurance cards. 7. You MUST have a responsible person to drive you home. 8. Someone MUST be with you the first 24 hours after you arrive home or your discharge will be delayed. 9. Please wear clothes that are easy to get on and off and wear slip-on shoes.  Thank you for allowing [place] to care for you!   -- [facility] Invasive Cardiovascular services    Signed, Donato Heinz, MD  02/26/2021 11:30 AM    [place]

## 2021-02-22 NOTE — Progress Notes (Signed)
Cardiology Office Note:    Date:  02/26/2021   ID:  [name], DOB 1946/01/24, MRN [id number]  PCP:  Alan Alar, NP  Cardiologist:  Alan Carnes, MD  Electrophysiologist:  None   Referring MD: Alan Alar, NP   Chief Complaint  Patient presents with   Congestive Heart Failure      History of Present Illness:    [name] is a 75 y.o. male with a hx of nonischemic cardiomyopathy, hypertension, OSA, alcohol abuse, hyperlipidemia who presents for follow-up.  Previously followed with [name].  Coronary CTA in 2008 showed anomalous RCA off the left main with scores between pulmonary artery and aorta, calcified plaque in proximal LAD causing less than 50% stenosis, calcium score to 48.  Echocardiogram 12/15/2018 showed EF 35 to 40%.  Echocardiogram on 12/05/2020 showed EF less than 123456, grade 2 diastolic dysfunction, mild RV dysfunction, moderate left atrial dilatation, severe right atrial dilatation, possible low-flow low gradient severe aortic stenosis.  Lexiscan Myoview on 12/05/2020 showed EF 12%, normal perfusion.  Cardiac MRI on 02/04/2021 showed severe LV dilatation, EF 15%, mid myocardial LGE, RV EF 43%, moderate MR (regurgitant fraction 27%).  Since last clinic visit, he reports that he has been doing well.  Walked 4 miles recently and had no symptoms.  Denies any chest pain, dyspnea, light, syncope, LE edema, palpitations.    Wt Readings from Last 3 Encounters:  02/24/21 202 lb 9.6 oz (91.9 kg)  02/16/21 195 lb (88.5 kg)  02/02/21 204 lb (92.5 kg)     Past Medical History:  Diagnosis Date   Alcohol abuse    Anomalous right coronary artery    Chronic systolic CHF (congestive heart failure) ([place]) 08/18/2016   Echo 1/17:  Diff HK especially in inf wall, mild LVH, EF 30-35, mild AS (mean 10, peak 18) // Echo 11/15: Mild LVH, EF 30-35, moderate HK, Gr 1 DD, mild AI, mild RAE // Echo 8/18: Mild LVH, EF 30-35, diffuse HK, mild aortic stenosis (mean 7), mild  AI, mild BAE   Coronary artery calcification 08/18/2016   Cor CTA 1/08: Ca score 248 // LHC 1/08: oLAD 25%   History of ETOH abuse    Hyperlipidemia 08/27/2013   Hypertension    Lactose intolerance    Non-ischemic cardiomyopathy ([place])    Presumed secondary to Etoh and HTN  (Prev EF 20-25% -- improved to 50%) // LHC 1/08: oLAD 25, EF 25%    OSA (obstructive sleep apnea) 10/17/2013   Severe per home sleep study performed on 09/26/13.    Sleep apnea    SOB (shortness of breath)    Vitamin D deficiency     No past surgical history on file.  Current Medications: Current Meds  Medication Sig   aspirin EC 81 MG tablet Take 1 tablet (81 mg total) by mouth daily.   carvedilol (COREG) 6.25 MG tablet TAKE 1 TABLET BY MOUTH TWICE DAILY WITH A MEAL   Coenzyme Q10-Vitamin E (QUNOL ULTRA COQ10 PO) Take by mouth.   COVID-19 mRNA bivalent vaccine, Pfizer, injection Inject into the muscle.   dapagliflozin propanediol (FARXIGA) 10 MG TABS tablet Take 1 tablet (10 mg total) by mouth daily before breakfast.   influenza vaccine adjuvanted (FLUAD QUADRIVALENT) 0.5 ML injection Inject into the muscle.   losartan (COZAAR) 25 MG tablet Take 1 tablet (25 mg total) by mouth daily.   Multiple Vitamins-Minerals (PRESERVISION AREDS 2+MULTI VIT PO) Take 1 tablet by mouth daily.   potassium chloride (KLOR-CON)  10 MEQ tablet TAKE 1  BY MOUTH ONCE DAILY   sildenafil (REVATIO) 20 MG tablet Take 1 tablet (20 mg total) by mouth 3 (three) times daily.   spironolactone (ALDACTONE) 25 MG tablet Take 1/2 (one-half) tablet by mouth once daily   Vitamin D, Ergocalciferol, (DRISDOL) 1.25 MG (50000 UNIT) CAPS capsule Take 1 capsule (50,000 Units total) by mouth once a week.   [DISCONTINUED] atorvastatin (LIPITOR) 10 MG tablet TAKE 1 TABLET BY MOUTH ONCE DAILY AT  6PM   [DISCONTINUED] furosemide (LASIX) 20 MG tablet Take 1 tablet (20 mg total) by mouth 2 (two) times daily.   [DISCONTINUED] sacubitril-valsartan (ENTRESTO) 24-26 MG  Take 1 tablet by mouth 2 (two) times daily.     Allergies:   Lisinopril   Social History   Socioeconomic History   Marital status: Married    Spouse name: [name]   Number of children: 3   Years of education: Not on file   Highest education level: Not on file  Occupational History   Occupation: Retired Development worker, community  Tobacco Use   Smoking status: Former    Years: 20.00    Types: Cigarettes    Quit date: 04/12/2002    Years since quitting: 18.8   Smokeless tobacco: Never  Vaping Use   Vaping Use: Never used  Substance and Sexual Activity   Alcohol use: Not Currently    Alcohol/week: 0.0 standard drinks   Drug use: No    Comment: history of marijuana use   Sexual activity: Yes  Other Topics Concern   Not on file  Social History Narrative   2 sons and daughter   1 son in Nature conservation officer. Living with wife 50yrs [name]).  Use to live near [place].   Social Determinants of Health   Financial Resource Strain: Not on file  Food Insecurity: Not on file  Transportation Needs: No Transportation Needs   Lack of Transportation (Medical): No   Lack of Transportation (Non-Medical): No  Physical Activity: Inactive   Days of Exercise per Week: 0 days   Minutes of Exercise per Session: 0 min  Stress: Not on file  Social Connections: Moderately Isolated   Frequency of Communication with Friends and Family: More than three times a week   Frequency of Social Gatherings with Friends and Family: More than three times a week   Attends Religious Services: Never   Marine scientist or Organizations: No   Attends Music therapist: Never   Marital Status: Married     Family History: The patient's family history includes Alcohol abuse in his father and mother; Heart disease in his maternal grandmother and mother; Heart failure in his father; High Cholesterol in his mother; High blood pressure in his mother; Stroke in his maternal grandmother; Sudden death in his mother. There is no history  of Diabetes.  ROS:   Please see the history of present illness.     All other systems reviewed and are negative.  EKGs/Labs/Other Studies Reviewed:    The following studies were reviewed today:   EKG:   02/24/21: NSR, first degree AV block, rate 74, nonspecific interventricular conduction delay, PVC   Recent Labs: 12/12/2020: TSH 1.570 02/16/2021: ALT 11 02/24/2021: BUN 17; Creatinine, Ser 1.24; Hemoglobin 16.5; Magnesium 2.3; Platelets 244; Potassium 5.0; Sodium 141  Recent Lipid Panel    Component Value Date/Time   CHOL 136 02/16/2021 0903   TRIG 65 02/16/2021 0903   HDL 52 02/16/2021 0903   CHOLHDL 2.6 06/25/2020 [id number]  CHOLHDL 3 04/16/2019 0738   VLDL 28.0 04/16/2019 0738   LDLCALC 71 02/16/2021 0903    Physical Exam:    VS:  BP 116/68   Pulse 85   Ht 5\' 10"  (1.778 m)   Wt 202 lb 9.6 oz (91.9 kg)   SpO2 97%   BMI 29.07 kg/m     Wt Readings from Last 3 Encounters:  02/24/21 202 lb 9.6 oz (91.9 kg)  02/16/21 195 lb (88.5 kg)  02/02/21 204 lb (92.5 kg)     GEN:  Well nourished, well developed in no acute distress HEENT: Normal NECK: No JVD; No carotid bruits CARDIAC: Irregular, normal rate, no murmurs, rubs, gallops RESPIRATORY:  Clear to auscultation without rales, wheezing or rhonchi  ABDOMEN: Soft, non-tender, non-distended MUSCULOSKELETAL:  No edema; No deformity  SKIN: Warm and dry NEUROLOGIC:  Alert and oriented x 3 PSYCHIATRIC:  Normal affect   ASSESSMENT:    1. Chronic combined systolic and diastolic heart failure ([place])   2. Aortic valve stenosis, etiology of cardiac valve disease unspecified   3. Essential hypertension   4. Medication management   5. Liver cyst   6. Hyperlipidemia, unspecified hyperlipidemia type      PLAN:    Chronic combined systolic and diastolic heart failure: Coronary CTA in 2008 showed anomalous RCA off the left main with scores between pulmonary artery and aorta, calcified plaque in proximal LAD causing less than  50% stenosis, calcium score 248.  Echocardiogram 12/15/2018 showed EF 35 to 40%..  Echocardiogram 12/15/2018 showed EF 35 to 40%.  Echocardiogram on 12/05/2020 showed EF less than 123456, grade 2 diastolic dysfunction, mild RV dysfunction, moderate left atrial dilatation, severe right atrial dilatation, possible low-flow low gradient severe aortic stenosis.  Lexiscan Myoview on 12/05/2020 showed EF 12%, normal perfusion.  Cardiac MRI on 02/04/2021 showed severe LV dilatation, EF 15%, mid myocardial LGE, RV EF 43%, moderate MR (regurgitant fraction 27%). -History of angioedema with lisinopril.  Started him on losartan 25 mg daily.  Appears he has been switched to [facility], though this is contraindicated in patients with prior angioedema on lisinopril.  Discussed with pharmacy, will switch back to losartan -Continue carvedilol 6.25 mg twice daily -Continue spironolactone 12.5 mg daily -Continue Farxiga 10 mg daily -Continue Lasix, will reduce dose to 20 mg daily.  Appears euvolemic -Recommend RHC/LHC for further evaluation.  His Myoview was unremarkable but given markedly elevated calcium score and severe systolic dysfunction, concern could have balanced ischemia and recommend definitive evaluation with cardiac catheterization.  In addition there was concern for severe AS on his echocardiogram; however AV calcium score suggests only mild calcifications.  Will plan to measure gradients through valve on catheterization -Risks and benefits of cardiac catheterization have been discussed with the patient.  These include bleeding, infection, kidney damage, stroke, heart attack, death.  The patient understands these risks and is willing to proceed.  ?Aortic stenosis: echocardiogram on 12/05/2020 possible low-flow low gradient severe aortic stenosis, though mean gradient only 7 mmHg.  Aortic valve calcium score was only 327, not consistent with severe AS.  Will evaluate further on LHC as above  Anomalous RCA off left main  with interarterial course: Lexiscan Myoview on 12/05/2020 showed EF 12%, normal perfusion.  CAD: Calcium score 1852 on 01/08/2021 (72nd percentile).  Lexiscan Myoview on 12/05/2020 showed normal perfusion. -Continue aspirin 81 mg daily -Continue atorvastatin 10 mg daily -Plan LHC as above  PVCs: Noted on EKG in clinic, however Zio patch x3 days on 11/27/2020 showed  2.4% PVC burden  Hypertension: Continue carvedilol 6.25 mg twice daily and spironolactone 12.5 mg daily and losartan  Hyperlipidemia: Continue atorvastatin 10 mg daily.  LDL 65 on 06/25/2020  OSA: on CPAP  Possible liver cyst: Noted on MRI, will evaluate further with RUQ [place]   RTC in 2 months    Medication Adjustments/Labs and Tests Ordered: Current medicines are reviewed at length with the patient today.  Concerns regarding medicines are outlined above.  Orders Placed This Encounter  Procedures   US Abdomen Limited RUQ (LIVER/GB)   Basic metabolic panel   CBC   Magnesium   EKG 12-Lead      Meds ordered this encounter  Medications   furosemide (LASIX) 20 MG tablet    Sig: Take 1 tablet (20 mg total) by mouth daily.    Dispense:  90 tablet    Refill:  3    Decreased to daily   losartan (COZAAR) 25 MG tablet    Sig: Take 1 tablet (25 mg total) by mouth daily.    Dispense:  90 tablet    Refill:  3    STOP Entresto      Patient Instructions  Medication Instructions:  STOP Entresto START Losartan 25 mg daily DECREASE Lasix to 20 mg once daily  *If you need a refill on your cardiac medications before your next appointment, please call your pharmacy*   Lab Work: BMET, CBC, Mag today  If you have labs (blood work) drawn today and your tests are completely normal, you will receive your results only by: [place] (if you have MyChart) OR A paper copy in the mail If you have any lab test that is abnormal or we need to change your treatment, we will call you to review the  results.   Testing/Procedures: Your physician has requested that you have a cardiac catheterization. Cardiac catheterization is used to diagnose and/or treat various heart conditions. Doctors may recommend this procedure for a number of different reasons. The most common reason is to evaluate chest pain. Chest pain can be a symptom of coronary artery disease (CAD), and cardiac catheterization can show whether plaque is narrowing or blocking your heart's arteries. This procedure is also used to evaluate the valves, as well as measure the blood flow and oxygen levels in different parts of your heart. For further information please visit [email or web address]. Please follow instruction sheet, as given.  RUQ Ultrasound-at [facility] --they will call you to schedule this  Follow-Up: At [facility], you and your health needs are our priority.  As part of our continuing mission to provide you with exceptional heart care, we have created designated Provider Care Teams.  These Care Teams include your primary Cardiologist (physician) and Advanced Practice Providers (APPs -  Physician Assistants and Nurse Practitioners) who all work together to provide you with the care you need, when you need it.  We recommend signing up for the patient portal called "MyChart".  Sign up information is provided on this After Visit Summary.  MyChart is used to connect with patients for Virtual Visits (Telemedicine).  Patients are able to view lab/test results, encounter notes, upcoming appointments, etc.  Non-urgent messages can be sent to your provider as well.   To learn more about what you can do with MyChart, go to [email or web address].    Your next appointment:   2 weeks with pharmD 2 months with Dr. Gardiner Rhyme   Other Instructions   [facility]  [address] [place] [address] Dept: [phone] Loc:  [phone]  JOASH FANK  02/24/2021  You are scheduled for a Cardiac Catheterization on Thursday, December 15 with Dr. Lauree Chandler.  1. Please arrive at the [facility] (Main Entrance A) at [facility]: [address] [place], [place] [address] at 5:30 AM (This time is two hours before your procedure to ensure your preparation). Free valet parking service is available.   Special note: Every effort is made to have your procedure done on time. Please understand that emergencies sometimes delay scheduled procedures.  2. Diet: Do not eat solid foods after midnight.  The patient may have clear liquids until 5am upon the day of the procedure.  3. Labs: Today in office   4. Medication instructions in preparation for your procedure:   Contrast Allergy: No  Hold Farxiga, Lasix, Spironolactone AM of procedure  On the morning of your procedure, take your Aspirin and any morning medicines NOT listed above.  You may use sips of water.  5. Plan for one night stay--bring personal belongings. 6. Bring a current list of your medications and current insurance cards. 7. You MUST have a responsible person to drive you home. 8. Someone MUST be with you the first 24 hours after you arrive home or your discharge will be delayed. 9. Please wear clothes that are easy to get on and off and wear slip-on shoes.  Thank you for allowing [place] to care for you!   -- [facility] Invasive Cardiovascular services    Signed, Donato Heinz, MD  02/26/2021 11:30 AM    [place]

## 2021-02-24 ENCOUNTER — Other Ambulatory Visit: Payer: Self-pay

## 2021-02-24 ENCOUNTER — Ambulatory Visit (INDEPENDENT_AMBULATORY_CARE_PROVIDER_SITE_OTHER): Payer: Medicare Other | Admitting: Cardiology

## 2021-02-24 ENCOUNTER — Encounter: Payer: Self-pay | Admitting: Cardiology

## 2021-02-24 VITALS — BP 116/68 | HR 85 | Ht 70.0 in | Wt 202.6 lb

## 2021-02-24 DIAGNOSIS — Z79899 Other long term (current) drug therapy: Secondary | ICD-10-CM

## 2021-02-24 DIAGNOSIS — E785 Hyperlipidemia, unspecified: Secondary | ICD-10-CM | POA: Diagnosis not present

## 2021-02-24 DIAGNOSIS — I350 Nonrheumatic aortic (valve) stenosis: Secondary | ICD-10-CM

## 2021-02-24 DIAGNOSIS — K7689 Other specified diseases of liver: Secondary | ICD-10-CM | POA: Diagnosis not present

## 2021-02-24 DIAGNOSIS — I5042 Chronic combined systolic (congestive) and diastolic (congestive) heart failure: Secondary | ICD-10-CM

## 2021-02-24 DIAGNOSIS — I10 Essential (primary) hypertension: Secondary | ICD-10-CM

## 2021-02-24 MED ORDER — FUROSEMIDE 20 MG PO TABS: 20.0000 mg | ORAL_TABLET | Freq: Every day | ORAL | 3 refills | Status: DC

## 2021-02-24 MED ORDER — LOSARTAN POTASSIUM 25 MG PO TABS: 25.0000 mg | ORAL_TABLET | Freq: Every day | ORAL | 3 refills | Status: AC

## 2021-02-24 NOTE — Patient Instructions (Signed)
Medication Instructions:  STOP Entresto START Losartan 25 mg daily DECREASE Lasix to 20 mg once daily  *If you need a refill on your cardiac medications before your next appointment, please call your pharmacy*   Lab Work: BMET, CBC, Mag today  If you have labs (blood work) drawn today and your tests are completely normal, you will receive your results only by: MyChart Message (if you have MyChart) OR A paper copy in the mail If you have any lab test that is abnormal or we need to change your treatment, we will call you to review the results.   Testing/Procedures: Your physician has requested that you have a cardiac catheterization. Cardiac catheterization is used to diagnose and/or treat various heart conditions. Doctors may recommend this procedure for a number of different reasons. The most common reason is to evaluate chest pain. Chest pain can be a symptom of coronary artery disease (CAD), and cardiac catheterization can show whether plaque is narrowing or blocking your heart's arteries. This procedure is also used to evaluate the valves, as well as measure the blood flow and oxygen levels in different parts of your heart. For further information please visit [email or web address] Please follow instruction sheet, as given.  RUQ Ultrasound-at [facility] --they will call you to schedule this  Follow-Up: At [facility], you and your health needs are our priority.  As part of our continuing mission to provide you with exceptional heart care, we have created designated Provider Care Teams.  These Care Teams include your primary Cardiologist (physician) and Advanced Practice Providers (APPs -  Physician Assistants and Nurse Practitioners) who all work together to provide you with the care you need, when you need it.  We recommend signing up for the patient portal called "MyChart".  Sign up information is provided on this After Visit Summary.  MyChart is used to connect with patients  for Virtual Visits (Telemedicine).  Patients are able to view lab/test results, encounter notes, upcoming appointments, etc.  Non-urgent messages can be sent to your provider as well.   To learn more about what you can do with MyChart, go to [email or web address].    Your next appointment:   2 weeks with pharmD 2 months with Dr. Bjorn Pippin   Other Instructions   [facility] CARDIOVASCULAR DIVISION [facility] [address] [place] [address] Dept: [phone] Loc: [phone]  DAYRON ODLAND  02/24/2021  You are scheduled for a Cardiac Catheterization on Thursday, December 15 with Dr. Verne Carrow.  1. Please arrive at the [facility] (Main Entrance A) at [facility]: [address] [place], [place] [address] at 5:30 AM (This time is two hours before your procedure to ensure your preparation). Free valet parking service is available.   Special note: Every effort is made to have your procedure done on time. Please understand that emergencies sometimes delay scheduled procedures.  2. Diet: Do not eat solid foods after midnight.  The patient may have clear liquids until 5am upon the day of the procedure.  3. Labs: Today in office   4. Medication instructions in preparation for your procedure:   Contrast Allergy: No  Hold Farxiga, Lasix, Spironolactone AM of procedure  On the morning of your procedure, take your Aspirin and any morning medicines NOT listed above.  You may use sips of water.  5. Plan for one night stay--bring personal belongings. 6. Bring a current list of your medications and current insurance cards. 7. You MUST  have a responsible person to drive you home. 8. Someone MUST be with you the first 24 hours after you arrive home or your discharge will be delayed. 9. Please wear clothes that are easy to get on and off and wear slip-on shoes.  Thank you for allowing [place] to care for you!   -- [facility] Invasive Cardiovascular services

## 2021-02-25 ENCOUNTER — Other Ambulatory Visit: Payer: Self-pay | Admitting: Family

## 2021-02-25 LAB — CBC
Hematocrit: 48.6 % (ref 37.5–51.0)
Hemoglobin: 16.5 g/dL (ref 13.0–17.7)
MCH: 27.6 pg (ref 26.6–33.0)
MCHC: 34 g/dL (ref 31.5–35.7)
MCV: 81 fL (ref 79–97)
Platelets: 244 10*3/uL (ref 150–450)
RBC: 5.97 x10E6/uL — ABNORMAL HIGH (ref 4.14–5.80)
RDW: 14.2 % (ref 11.6–15.4)
WBC: 4.9 10*3/uL (ref 3.4–10.8)

## 2021-02-25 LAB — BASIC METABOLIC PANEL
BUN/Creatinine Ratio: 14 (ref 10–24)
BUN: 17 mg/dL (ref 8–27)
CO2: 26 mmol/L (ref 20–29)
Calcium: 9.1 mg/dL (ref 8.6–10.2)
Chloride: 100 mmol/L (ref 96–106)
Creatinine, Ser: 1.24 mg/dL (ref 0.76–1.27)
Glucose: 81 mg/dL (ref 70–99)
Potassium: 5 mmol/L (ref 3.5–5.2)
Sodium: 141 mmol/L (ref 134–144)
eGFR: 61 mL/min/{1.73_m2} (ref >59)

## 2021-02-25 LAB — MAGNESIUM: Magnesium: 2.3 mg/dL (ref 1.6–2.3)

## 2021-03-03 ENCOUNTER — Encounter: Payer: Medicare Other | Admitting: Gastroenterology

## 2021-03-09 ENCOUNTER — Ambulatory Visit
Admission: RE | Admit: 2021-03-09 | Discharge: 2021-03-09 | Disposition: A | Discharge status: Home / Self Care | Payer: Medicare Other | Source: Ambulatory Visit | Attending: Cardiology | Admitting: Cardiology

## 2021-03-09 DIAGNOSIS — K7689 Other specified diseases of liver: Secondary | ICD-10-CM

## 2021-03-11 ENCOUNTER — Other Ambulatory Visit: Payer: Self-pay | Admitting: *Deleted

## 2021-03-11 DIAGNOSIS — K7689 Other specified diseases of liver: Secondary | ICD-10-CM

## 2021-03-16 ENCOUNTER — Encounter (INDEPENDENT_AMBULATORY_CARE_PROVIDER_SITE_OTHER): Payer: Self-pay | Admitting: Family Medicine

## 2021-03-16 ENCOUNTER — Other Ambulatory Visit: Payer: Self-pay

## 2021-03-16 ENCOUNTER — Ambulatory Visit (INDEPENDENT_AMBULATORY_CARE_PROVIDER_SITE_OTHER): Payer: Medicare Other | Admitting: Family Medicine

## 2021-03-16 VITALS — BP 99/69 | HR 86 | Temp 97.8°F | Ht 70.0 in | Wt 195.0 lb

## 2021-03-16 DIAGNOSIS — E559 Vitamin D deficiency, unspecified: Secondary | ICD-10-CM | POA: Diagnosis not present

## 2021-03-16 DIAGNOSIS — E669 Obesity, unspecified: Secondary | ICD-10-CM

## 2021-03-16 DIAGNOSIS — I5022 Chronic systolic (congestive) heart failure: Secondary | ICD-10-CM | POA: Diagnosis not present

## 2021-03-16 DIAGNOSIS — Z6830 Body mass index (BMI) 30.0-30.9, adult: Secondary | ICD-10-CM | POA: Diagnosis not present

## 2021-03-16 MED ORDER — VITAMIN D (ERGOCALCIFEROL) 1.25 MG (50000 UNIT) PO CAPS: 50000.0000 [IU] | ORAL_CAPSULE | ORAL | 0 refills | Status: DC

## 2021-03-16 NOTE — Progress Notes (Signed)
Chief Complaint:   OBESITY Pratik is here to discuss his progress with his obesity treatment plan along with follow-up of his obesity related diagnoses. Charan is on keeping a food journal and adhering to recommended goals of 1200-1600 calories and 85+ grams of protein daily and states he is following his eating plan approximately 25% of the time. Vishaal states he is doing 0 minutes 0 times per week.  Today's visit was #: 22 Starting weight: 223 lbs Starting date: 08/15/2019 Today's weight: 195 lbs Today's date: 03/16/2021 Total lbs lost to date: 28 Total lbs lost since last in-office visit: 0  Interim History: Antwan did well avoiding holiday weight gain over Thanksgiving. He tried to portion control his carbohydrates. He is working on increasing his protein.  Subjective:   1. Vitamin D deficiency Takumi is on Vit D prescription. I discussed labs with the patient today.  2. Chronic systolic CHF (congestive heart failure) (HCC) Sadler was on [place] and he felt better. He was recently changed to Aldactone due to his history of angioedema with lisinopril.   Assessment/Plan:   We will refill prescription Vitamin D 50,000 IU every week for 1 month. Regginald will follow-up for routine testing of Vitamin D, at least 2-3 times per year to avoid over-replacement.  - Vitamin D, Ergocalciferol, (DRISDOL) 1.25 MG (50000 UNIT) CAPS capsule; Take 1 capsule (50,000 Units total) by mouth once a week.  Dispense: 4 capsule; Refill: 0  2. Chronic systolic CHF (congestive heart failure) (HCC) Tedrick is to discuss the risk of Entresto with his pharmacist and will follow up.  3. Obesity with current BMI 28.0 Icarus is currently in the action stage of change. As such, his goal is to continue with weight loss efforts. He has agreed to keeping a food journal and adhering to recommended goals of 1200-1600 calories and 85+ grams of protein daily.   Behavioral modification strategies: holiday eating strategies .  Bohdi  has agreed to follow-up with our clinic in 4 weeks. He was informed of the importance of frequent follow-up visits to maximize his success with intensive lifestyle modifications for his multiple health conditions.   Objective:   Blood pressure 99/69, pulse 86, temperature 97.8 F (36.6 C), height 5\' 10"  (1.778 m), weight 195 lb (88.5 kg), SpO2 97 %. Body mass index is 27.98 kg/m.  General: Cooperative, alert, well developed, in no acute distress. HEENT: Conjunctivae and lids unremarkable. Cardiovascular: Regular rhythm.  Lungs: Normal work of breathing. Neurologic: No focal deficits.   Lab Results  Component Value Date   CREATININE 1.24 02/24/2021   BUN 17 02/24/2021   NA 141 02/24/2021   K 5.0 02/24/2021   CL 100 02/24/2021   CO2 26 02/24/2021   Lab Results  Component Value Date   ALT 11 02/16/2021   AST 18 02/16/2021   ALKPHOS 71 02/16/2021   BILITOT 0.9 02/16/2021   Lab Results  Component Value Date   HGBA1C 5.8 (H) 02/16/2021   HGBA1C 5.7 (H) 06/25/2020   HGBA1C 5.5 02/12/2020   HGBA1C 5.9 (H) 11/13/2019   HGBA1C 6.1 (H) 08/15/2019   Lab Results  Component Value Date   INSULIN 14.9 02/16/2021   INSULIN 6.3 06/25/2020   INSULIN 7.6 02/12/2020   INSULIN 10.2 11/13/2019   INSULIN 10.0 08/15/2019   Lab Results  Component Value Date   TSH 1.570 12/12/2020   Lab Results  Component Value Date   CHOL 136 02/16/2021   HDL 52 02/16/2021   LDLCALC  71 02/16/2021   TRIG 65 02/16/2021   CHOLHDL 2.6 06/25/2020   Lab Results  Component Value Date   VD25OH 58.8 02/16/2021   VD25OH 53.4 06/25/2020   VD25OH 63.0 02/12/2020   Lab Results  Component Value Date   WBC 4.9 02/24/2021   HGB 16.5 02/24/2021   HCT 48.6 02/24/2021   MCV 81 02/24/2021   PLT 244 02/24/2021   No results found for: IRON, TIBC, FERRITIN  Obesity Behavioral Intervention:   Approximately 15 minutes were spent on the discussion below.  ASK: We discussed the diagnosis of obesity with  [name] today and Tandre agreed to give [place] permission to discuss obesity behavioral modification therapy today.  ASSESS: Zakarie has the diagnosis of obesity and his BMI today is 28.0. Kyden is in the action stage of change.   ADVISE: Kenan was educated on the multiple health risks of obesity as well as the benefit of weight loss to improve his health. He was advised of the need for long term treatment and the importance of lifestyle modifications to improve his current health and to decrease his risk of future health problems.  AGREE: Multiple dietary modification options and treatment options were discussed and Nickan agreed to follow the recommendations documented in the above note.  ARRANGE: Keitaro was educated on the importance of frequent visits to treat obesity as outlined per CMS and USPSTF guidelines and agreed to schedule his next follow up appointment today.  Attestation Statements:   Reviewed by clinician on day of visit: allergies, medications, problem list, medical history, surgical history, family history, social history, and previous encounter notes.   I, Trixie Dredge, am acting as transcriptionist for Dennard Nip, MD.  I have reviewed the above documentation for accuracy and completeness, and I agree with the above. -  Dennard Nip, MD

## 2021-03-17 ENCOUNTER — Ambulatory Visit (INDEPENDENT_AMBULATORY_CARE_PROVIDER_SITE_OTHER): Payer: Medicare Other | Admitting: Pharmacist Clinician (PhC)/ Clinical Pharmacy Specialist

## 2021-03-17 ENCOUNTER — Encounter: Payer: Self-pay | Admitting: Pharmacist Clinician (PhC)/ Clinical Pharmacy Specialist

## 2021-03-17 DIAGNOSIS — I5022 Chronic systolic (congestive) heart failure: Secondary | ICD-10-CM | POA: Diagnosis not present

## 2021-03-17 NOTE — Assessment & Plan Note (Signed)
Patient with HFrEF unable to take Entresto due to prior angioedema with lisinopril.  He admits that he felt better (more energy) with Entresto than he does with losartan, but understands the concerns.  His BP yesterday at [facility], as well as today's reading, show no room for dose increases of his GDMT.  Will need to monitor him over time, as he loses weight, he could potentially drop BP even more, which would necessitate decreasing carvedilol or even discontinuing one medication altogether.  He is scheduled to see Dr. Bjorn Pippin next month for follow up.

## 2021-03-17 NOTE — Progress Notes (Signed)
03/17/2021 [name] [birth year] [id number]   HPI:  [name] is a 75 y.o. male patient of Dr Bjorn Pippin, with a PMH below who presents today for heart failure medication titration.  Most recent echo (12/05/20) showed EF at < 20%.  Lexiscan Myoview on the same day noted it to be at 12%, while a cardiac MRI in October listed at 15%.  Patient currently on GDMT, having switched off Entresto (to losartan) due to prior history of angioedema with lisinopril.   Past Medical History: hypertension Controlled with HF medications  hyperlipidemia 3/22 LDL 65 on atorvastatin 10 mg  CAD Coronary calcium score 1852 (72nd percentile)  OSA On CPAP  Pre-diabetes 11/22 A1c at 5.8, was as high as 6.4 three years ago     Blood Pressure Goal:  130/80  Current Medications:  losartan 25 qd, carvedilol 6.25 bid, spironolactone 12.5 qd, dapagliflozin 10  Family Hx: mother died at 47, father at 79, both from heart disease; sister also with CHF (and on [place])   Social Hx: no alcohol, no tobacco,  occasional morning tea   Diet: followed by [facility] (down 28 pounds from start);  eliminated salt from diet   Exercise: gym 60 min twice weekly (30 min on treadmill, 30 min modest weigh training)   Home BP readings: home 118/75; range 132 highest, lowest >110 by recall.  highest systolic at 120   Intolerances: lisinopirl - angioedema   Labs: 02/24/21:  Na 141, K 5.0, Glu 81, BUN 17, Scr 1.24, GFR 61   Wt Readings from Last 3 Encounters:  03/17/21 195 lb (88.5 kg)  03/16/21 195 lb (88.5 kg)  02/24/21 202 lb 9.6 oz (91.9 kg)   BP Readings from Last 3 Encounters:  03/17/21 96/62  03/16/21 99/69  02/24/21 116/68   Pulse Readings from Last 3 Encounters:  03/17/21 (!) 58  03/16/21 86  02/24/21 85    Current Outpatient Medications  Medication Sig Dispense Refill   aspirin EC 81 MG tablet Take 1 tablet (81 mg total) by mouth daily. 90 tablet 3   atorvastatin (LIPITOR) 10 MG  tablet TAKE 1 TABLET BY MOUTH ONCE DAILY AT  6PM 90 tablet 0   carvedilol (COREG) 6.25 MG tablet TAKE 1 TABLET BY MOUTH TWICE DAILY WITH A MEAL 60 tablet 5   Coenzyme Q10-Vitamin E (QUNOL ULTRA COQ10 PO) Take by mouth.     dapagliflozin propanediol (FARXIGA) 10 MG TABS tablet Take 1 tablet (10 mg total) by mouth daily before breakfast. 90 tablet 3   furosemide (LASIX) 20 MG tablet Take 1 tablet (20 mg total) by mouth daily. 90 tablet 3   losartan (COZAAR) 25 MG tablet Take 1 tablet (25 mg total) by mouth daily. 90 tablet 3   polyethylene glycol (MIRALAX / GLYCOLAX) 17 g packet Take 17 g by mouth daily.     sildenafil (REVATIO) 20 MG tablet Take 1 tablet (20 mg total) by mouth 3 (three) times daily. 30 tablet 2   spironolactone (ALDACTONE) 25 MG tablet Take 1/2 (one-half) tablet by mouth once daily 45 tablet 2   Vitamin D, Ergocalciferol, (DRISDOL) 1.25 MG (50000 UNIT) CAPS capsule Take 1 capsule (50,000 Units total) by mouth once a week. 4 capsule 0   No current facility-administered medications for this visit.    Allergies  Allergen Reactions   Lisinopril Swelling    ANGIOEDEMA    Past Medical History:  Diagnosis Date   Alcohol abuse  Anomalous right coronary artery    Chronic systolic CHF (congestive heart failure) (HCC) 08/18/2016   Echo 1/17:  Diff HK especially in inf wall, mild LVH, EF 30-35, mild AS (mean 10, peak 18) // Echo 11/15: Mild LVH, EF 30-35, moderate HK, Gr 1 DD, mild AI, mild RAE // Echo 8/18: Mild LVH, EF 30-35, diffuse HK, mild aortic stenosis (mean 7), mild AI, mild BAE   Coronary artery calcification 08/18/2016   Cor CTA 1/08: Ca score 248 // LHC 1/08: oLAD 25%   History of ETOH abuse    Hyperlipidemia 08/27/2013   Hypertension    Lactose intolerance    Non-ischemic cardiomyopathy (HCC)    Presumed secondary to Etoh and HTN  (Prev EF 20-25% -- improved to 50%) // LHC 1/08: oLAD 25, EF 25%    OSA (obstructive sleep apnea) 10/17/2013   Severe per home sleep study  performed on 09/26/13.    Sleep apnea    SOB (shortness of breath)    Vitamin D deficiency     Blood pressure 96/62, pulse (!) 58, height 5\' 10"  (1.778 m), weight 195 lb (88.5 kg).  Chronic systolic CHF (congestive heart failure) (HCC) Patient with HFrEF unable to take Entresto due to prior angioedema with lisinopril.  He admits that he felt better (more energy) with Entresto than he does with losartan, but understands the concerns.  His BP yesterday at [facility], as well as today's reading, show no room for dose increases of his GDMT.  Will need to monitor him over time, as he loses weight, he could potentially drop BP even more, which would necessitate decreasing carvedilol or even discontinuing one medication altogether.  He is scheduled to see Dr. next month for follow up.    Bjorn Pippin PharmD CPP [facility] [address] [place], [place] [phone]

## 2021-03-17 NOTE — Patient Instructions (Signed)
Return for a a follow up appointment with Dr. Bjorn Pippin on January 9  Check your blood pressure at home off and on over the next few weeks, and keep record of the readings.  Take your meds as follows:  Continue with all your current medications.  Bring all of your meds, your BP cuff and your record of home blood pressures to your next appointment.  Exercise as you're able, try to walk approximately 30 minutes per day.  Keep salt intake to a minimum, especially watch canned and prepared boxed foods.  Eat more fresh fruits and vegetables and fewer canned items.  Avoid eating in fast food restaurants.    HOW TO TAKE YOUR BLOOD PRESSURE: Rest 5 minutes before taking your blood pressure.  Don't smoke or drink caffeinated beverages for at least 30 minutes before. Take your blood pressure before (not after) you eat. Sit comfortably with your back supported and both feet on the floor (don't cross your legs). Elevate your arm to heart level on a table or a desk. Use the proper sized cuff. It should fit smoothly and snugly around your bare upper arm. There should be enough room to slip a fingertip under the cuff. The bottom edge of the cuff should be 1 inch above the crease of the elbow. Ideally, take 3 measurements at one sitting and record the average.

## 2021-03-18 ENCOUNTER — Encounter: Payer: Self-pay | Admitting: Gastroenterology

## 2021-03-18 ENCOUNTER — Ambulatory Visit (INDEPENDENT_AMBULATORY_CARE_PROVIDER_SITE_OTHER): Payer: Medicare Other | Admitting: Gastroenterology

## 2021-03-18 ENCOUNTER — Telehealth: Payer: Self-pay

## 2021-03-18 VITALS — BP 110/70 | HR 76 | Ht 70.0 in | Wt 205.0 lb

## 2021-03-18 DIAGNOSIS — R195 Other fecal abnormalities: Secondary | ICD-10-CM | POA: Diagnosis not present

## 2021-03-18 NOTE — Telephone Encounter (Signed)
[facility] Pre-operative Risk Assessment     Request for surgical clearance:     Endoscopy Procedure  What type of surgery is being performed?     Colonoscopy  When is this surgery scheduled?     05-04-21  What type of clearance is required ?  Cardiac Clearance   Patient has EF less than 20%.  Scheduled to have LHC/RHC 03-26-21 with Dr Angelena Form.  Practice name and name of physician performing surgery?      [facility]  What is your office phone and fax number?      Phone- [phone]  [phone]  Anesthesia type (None, local, MAC, general) ?       MAC

## 2021-03-18 NOTE — Progress Notes (Signed)
HPI: This is a very pleasant 75 year old man who was referred to me by Sandford Craze, NP  to evaluate Cologuard positive stool.    He has no troubles with his GI tract.  Specifically no constipation, no diarrhea, no overt GI bleeding.  Colon cancer does not run in his family.  He had Cologuard testing and it was +3 months ago.  He has no shortness of breath.  He walks up stairs without dyspnea on exertion.  He is scheduled for an angiogram next week   Old Data Reviewed: Colonoscopy July 2008 for routine risk colon cancer screening showed diverticulosis and was otherwise normal.  Blood work November 2022 showed normal CBC normal basic metabolic profile  Cologuard colon cancer screening test September 2022 was positive  MRI cardiac October 2022 with estimated his LV ejection fraction around 15% Echocardiogram August 2022 shows left ventricular ejection fraction less than 20%    Review of systems: Pertinent positive and negative review of systems were noted in the above HPI section. All other review negative.   Past Medical History:  Diagnosis Date   Alcohol abuse    Anomalous right coronary artery    Chronic systolic CHF (congestive heart failure) (HCC) 08/18/2016   Echo 1/17:  Diff HK especially in inf wall, mild LVH, EF 30-35, mild AS (mean 10, peak 18) // Echo 11/15: Mild LVH, EF 30-35, moderate HK, Gr 1 DD, mild AI, mild RAE // Echo 8/18: Mild LVH, EF 30-35, diffuse HK, mild aortic stenosis (mean 7), mild AI, mild BAE   Coronary artery calcification 08/18/2016   Cor CTA 1/08: Ca score 248 // LHC 1/08: oLAD 25%   History of ETOH abuse    Hyperlipidemia 08/27/2013   Hypertension    Lactose intolerance    Non-ischemic cardiomyopathy (HCC)    Presumed secondary to Etoh and HTN  (Prev EF 20-25% -- improved to 50%) // LHC 1/08: oLAD 25, EF 25%    OSA (obstructive sleep apnea) 10/17/2013   Severe per home sleep study performed on 09/26/13.    Sleep apnea    SOB (shortness  of breath)    Vitamin D deficiency     History reviewed. No pertinent surgical history.  Current Outpatient Medications  Medication Sig Dispense Refill   aspirin EC 81 MG tablet Take 1 tablet (81 mg total) by mouth daily. 90 tablet 3   atorvastatin (LIPITOR) 10 MG tablet TAKE 1 TABLET BY MOUTH ONCE DAILY AT  6PM 90 tablet 0   carvedilol (COREG) 6.25 MG tablet TAKE 1 TABLET BY MOUTH TWICE DAILY WITH A MEAL 60 tablet 5   Coenzyme Q10-Vitamin E (QUNOL ULTRA COQ10 PO) Take by mouth.     dapagliflozin propanediol (FARXIGA) 10 MG TABS tablet Take 1 tablet (10 mg total) by mouth daily before breakfast. 90 tablet 3   furosemide (LASIX) 20 MG tablet Take 1 tablet (20 mg total) by mouth daily. 90 tablet 3   losartan (COZAAR) 25 MG tablet Take 1 tablet (25 mg total) by mouth daily. 90 tablet 3   polyethylene glycol (MIRALAX / GLYCOLAX) 17 g packet Take 17 g by mouth daily.     sildenafil (REVATIO) 20 MG tablet Take 1 tablet (20 mg total) by mouth 3 (three) times daily. 30 tablet 2   spironolactone (ALDACTONE) 25 MG tablet Take 1/2 (one-half) tablet by mouth once daily 45 tablet 2   Vitamin D, Ergocalciferol, (DRISDOL) 1.25 MG (50000 UNIT) CAPS capsule Take 1 capsule (50,000 Units total) by  mouth once a week. 4 capsule 0   No current facility-administered medications for this visit.    Allergies as of 03/18/2021 - Review Complete 03/18/2021  Allergen Reaction Noted   Lisinopril Swelling 05/05/2016    Family History  Problem Relation Age of Onset   Alcohol abuse Mother    Heart disease Mother    High blood pressure Mother    High Cholesterol Mother    Sudden death Mother    Alcohol abuse Father    Heart failure Father    Heart disease Maternal Grandmother    Stroke Maternal Grandmother    Diabetes Neg Hx     Social History   Socioeconomic History   Marital status: Married    Spouse name: Constance Holster   Number of children: 3   Years of education: Not on file   Highest education level: Not  on file  Occupational History   Occupation: Retired Development worker, community  Tobacco Use   Smoking status: Former    Years: 20.00    Types: Cigarettes    Quit date: 04/12/2002    Years since quitting: 18.9   Smokeless tobacco: Never  Vaping Use   Vaping Use: Never used  Substance and Sexual Activity   Alcohol use: Not Currently    Alcohol/week: 0.0 standard drinks   Drug use: No    Comment: history of marijuana use   Sexual activity: Yes  Other Topics Concern   Not on file  Social History Narrative   2 sons and daughter   1 son in Nature conservation officer. Living with wife 57yrs Constance Holster).  Use to live near [place].   Social Determinants of Health   Financial Resource Strain: Not on file  Food Insecurity: Not on file  Transportation Needs: No Transportation Needs   Lack of Transportation (Medical): No   Lack of Transportation (Non-Medical): No  Physical Activity: Inactive   Days of Exercise per Week: 0 days   Minutes of Exercise per Session: 0 min  Stress: Not on file  Social Connections: Moderately Isolated   Frequency of Communication with Friends and Family: More than three times a week   Frequency of Social Gatherings with Friends and Family: More than three times a week   Attends Religious Services: Never   Marine scientist or Organizations: No   Attends Music therapist: Never   Marital Status: Married  Human resources officer Violence: Not At Risk   Fear of Current or Ex-Partner: No   Emotionally Abused: No   Physically Abused: No   Sexually Abused: No     Physical Exam: Ht 5\' 10"  (1.778 m)   Wt 205 lb (93 kg)   BMI 29.41 kg/m  Constitutional: generally well-appearing Psychiatric: alert and oriented x3 Eyes: extraocular movements intact Mouth: oral pharynx moist, no lesions Neck: supple no lymphadenopathy Cardiovascular: heart regular rate and rhythm Lungs: clear to auscultation bilaterally Abdomen: soft, nontender, nondistended, no obvious ascites, no peritoneal signs,  normal bowel sounds Extremities: no lower extremity edema bilaterally Skin: no lesions on visible extremities   Assessment and plan: 75 y.o. male with Cologuard positive stool, severe CHF with a left ventricular ejection fraction of 15%  He is at elevated risk for any type of procedures given his severe CHF.  Remarkably he is not even on oxygen right now.  He ambulates without dyspnea.  Given his significantly low ejection fraction I am going to request a formal cardiac clearance.  He is having an angiogram next week and so I  imagine that will factor into cardiac clearance decision.  We will tentatively schedule him for a colonoscopy at the hospital in mid January and work from there based on cardiac input.   Please see the "Patient Instructions" section for addition details about the plan.   Owens Loffler, MD [facility] 03/18/2021, 8:32 AM  Cc: Debbrah Alar, NP  Total time on date of encounter was 45 minutes (this included time spent preparing to see the patient reviewing records; obtaining and/or reviewing separately obtained history; performing a medically appropriate exam and/or evaluation; counseling and educating the patient and family if present; ordering medications, tests or procedures if applicable; and documenting clinical information in the health record).

## 2021-03-18 NOTE — Patient Instructions (Signed)
If you are age 74 or older, your body mass index should be between 23-30. Your Body mass index is 29.41 kg/m. If this is out of the aforementioned range listed, please consider follow up with your Primary Care Provider. ________________________________________________________  The [facility] providers would like to encourage you to use [facility] to communicate with providers for non-urgent requests or questions.  Due to long hold times on the telephone, sending your provider a message by [facility] may be a faster and more efficient way to get a response.  Please allow 48 business hours for a response.  Please remember that this is for non-urgent requests.  _______________________________________________________  Bonita Quin have been scheduled for a colonoscopy. Please follow written instructions given to you at your visit today.  Please pick up your prep supplies at the pharmacy within the next 1-3 days. If you use inhalers (even only as needed), please bring them with you on the day of your procedure.  Due to recent changes in healthcare laws, you may see the results of your imaging and laboratory studies on MyChart before your provider has had a chance to review them.  We understand that in some cases there may be results that are confusing or concerning to you. Not all laboratory results come back in the same time frame and the provider may be waiting for multiple results in order to interpret others.  Please give [place] 48 hours in order for your provider to thoroughly review all the results before contacting the office for clarification of your results.   Thank you for entrusting me with your care and choosing [facility].  Dr Christella Hartigan

## 2021-03-19 NOTE — Telephone Encounter (Signed)
   Patient Name: DEVAUNTE GASPARINI  DOB: 1946/03/27 MRN: [id number]  Primary Cardiologist: Little Ishikawa, MD  Chart reviewed as part of pre-operative protocol coverage. The patient has an upcoming appointment scheduled 04/20/21 at which time this clearance can be addressed in case there are any issues that would impact surgical clearance. I added preop FYI to appointment notes so that provider is aware to address at time of OV. Patient is scheduled for upcoming heart cath next week with Dr. Clifton James which may ultimately impact clearance decision, so needs f/u OV to finalize recommendations. Will cc to Dr. Nelly Laurence. Clifton James so they are aware GI was planning colonoscopy in late January for + cologuard.  Per office protocol, at time of f/u OV in January, the cardiology provider should forward their finalized clearance decision to requesting party below. I will route this message as FYI to requesting party and remove this message from the pre-op box as separate preop APP input not needed at this time.  Please call with questions.  Laurann Montana, PA-C 03/19/2021, 2:48 PM

## 2021-03-23 NOTE — Telephone Encounter (Signed)
Noted.  A staff message was sent to myself for 04-20-21 after cardiac follow up appointment to see if patient has been cleared.

## 2021-03-25 ENCOUNTER — Telehealth: Payer: Self-pay | Admitting: *Deleted

## 2021-03-25 NOTE — Telephone Encounter (Signed)
Cardiac catheterization scheduled at [facility] for: Thursday March 26, 2021 7:30 AM [facility] Main Entrance [facility]) at: 5:30 AM   Diet-no solid food after midnight prior to cath, clear liquids until 5 AM day of procedure.  Medication instructions for procedure: -Hold:  Spironolactone-AM of procedure   Lasix-AM of procedure  Farxiga-AM of procedure -Except hold medications usual morning medications can be taken pre-cath with sips of water including aspirin 81 mg.    Confirmed patient has responsible adult to drive home post procedure and be with patient first 24 hours after arriving home.  [facility] does allow one visitor to accompany you and wait in the hospital waiting room while you are there for your procedure. You and your visitor will be asked to wear a mask once you enter the hospital.   Patient reports does not currently have any new symptoms concerning for COVID-19 and no household members with COVID-19 like illness.     Reviewed procedure/mask/visitor instructions with patient.       '

## 2021-03-26 ENCOUNTER — Encounter (HOSPITAL_COMMUNITY)
Admission: RE | Disposition: A | Discharge status: Home / Self Care | Payer: Self-pay | Source: Home / Self Care | Attending: Cardiovascular Disease

## 2021-03-26 ENCOUNTER — Ambulatory Visit (HOSPITAL_COMMUNITY)
Admission: RE | Admit: 2021-03-26 | Discharge: 2021-03-26 | Disposition: A | Discharge status: Home / Self Care | Payer: Medicare Other | Attending: Cardiovascular Disease | Admitting: Cardiovascular Disease

## 2021-03-26 ENCOUNTER — Other Ambulatory Visit: Payer: Self-pay

## 2021-03-26 DIAGNOSIS — I5042 Chronic combined systolic (congestive) and diastolic (congestive) heart failure: Secondary | ICD-10-CM | POA: Diagnosis not present

## 2021-03-26 DIAGNOSIS — I2510 Atherosclerotic heart disease of native coronary artery without angina pectoris: Secondary | ICD-10-CM | POA: Insufficient documentation

## 2021-03-26 DIAGNOSIS — K7689 Other specified diseases of liver: Secondary | ICD-10-CM | POA: Insufficient documentation

## 2021-03-26 DIAGNOSIS — G4733 Obstructive sleep apnea (adult) (pediatric): Secondary | ICD-10-CM | POA: Diagnosis not present

## 2021-03-26 DIAGNOSIS — Z79899 Other long term (current) drug therapy: Secondary | ICD-10-CM | POA: Diagnosis not present

## 2021-03-26 DIAGNOSIS — I272 Pulmonary hypertension, unspecified: Secondary | ICD-10-CM | POA: Insufficient documentation

## 2021-03-26 DIAGNOSIS — I110 Hypertensive heart disease with heart failure: Secondary | ICD-10-CM | POA: Insufficient documentation

## 2021-03-26 DIAGNOSIS — E785 Hyperlipidemia, unspecified: Secondary | ICD-10-CM | POA: Insufficient documentation

## 2021-03-26 DIAGNOSIS — Z7982 Long term (current) use of aspirin: Secondary | ICD-10-CM | POA: Insufficient documentation

## 2021-03-26 DIAGNOSIS — I350 Nonrheumatic aortic (valve) stenosis: Secondary | ICD-10-CM | POA: Diagnosis not present

## 2021-03-26 DIAGNOSIS — I428 Other cardiomyopathies: Secondary | ICD-10-CM | POA: Insufficient documentation

## 2021-03-26 HISTORY — PX: RIGHT/LEFT HEART CATH AND CORONARY ANGIOGRAPHY: CATH118266

## 2021-03-26 LAB — POCT I-STAT 7, (LYTES, BLD GAS, ICA,H+H)
Acid-base deficit: 1 mmol/L (ref 0.0–2.0)
Bicarbonate: 23.6 mmol/L (ref 20.0–28.0)
Calcium, Ion: 1.1 mmol/L — ABNORMAL LOW (ref 1.15–1.40)
HCT: 46 % (ref 39.0–52.0)
Hemoglobin: 15.6 g/dL (ref 13.0–17.0)
O2 Saturation: 94 %
Potassium: 3.5 mmol/L (ref 3.5–5.1)
Sodium: 143 mmol/L (ref 135–145)
TCO2: 25 mmol/L (ref 22–32)
pCO2 arterial: 38.2 mmHg (ref 32.0–48.0)
pH, Arterial: 7.399 (ref 7.350–7.450)
pO2, Arterial: 69 mmHg — ABNORMAL LOW (ref 83.0–108.0)

## 2021-03-26 LAB — POCT I-STAT EG7
Acid-Base Excess: 1 mmol/L (ref 0.0–2.0)
Bicarbonate: 27.2 mmol/L (ref 20.0–28.0)
Calcium, Ion: 1.26 mmol/L (ref 1.15–1.40)
HCT: 49 % (ref 39.0–52.0)
Hemoglobin: 16.7 g/dL (ref 13.0–17.0)
O2 Saturation: 67 %
Potassium: 3.9 mmol/L (ref 3.5–5.1)
Sodium: 141 mmol/L (ref 135–145)
TCO2: 29 mmol/L (ref 22–32)
pCO2, Ven: 46.4 mmHg (ref 44.0–60.0)
pH, Ven: 7.375 (ref 7.250–7.430)
pO2, Ven: 36 mmHg (ref 32.0–45.0)

## 2021-03-26 SURGERY — RIGHT/LEFT HEART CATH AND CORONARY ANGIOGRAPHY
Anesthesia: LOCAL

## 2021-03-26 MED ORDER — LIDOCAINE HCL (PF) 1 % IJ SOLN
INTRAMUSCULAR | Status: AC
  Filled 2021-03-26: qty 30

## 2021-03-26 MED ORDER — HEPARIN (PORCINE) IN NACL 2000-0.9 UNIT/L-% IV SOLN
INTRAVENOUS | Status: DC | PRN
  Administered 2021-03-26: 1000 mL

## 2021-03-26 MED ORDER — ACETAMINOPHEN 325 MG PO TABS: 650.0000 mg | ORAL_TABLET | ORAL | Status: DC | PRN

## 2021-03-26 MED ORDER — FENTANYL CITRATE (PF) 100 MCG/2ML IJ SOLN
INTRAMUSCULAR | Status: DC | PRN
  Administered 2021-03-26: 25 ug via INTRAVENOUS

## 2021-03-26 MED ORDER — SODIUM CHLORIDE 0.9 % IV SOLN: 250.0000 mL | INTRAVENOUS | Status: DC | PRN

## 2021-03-26 MED ORDER — SODIUM CHLORIDE 0.9% FLUSH: 3.0000 mL | INTRAVENOUS | Status: DC | PRN

## 2021-03-26 MED ORDER — SODIUM CHLORIDE 0.9% FLUSH: 3.0000 mL | Freq: Two times a day (BID) | INTRAVENOUS | Status: DC

## 2021-03-26 MED ORDER — SODIUM CHLORIDE 0.9 % IV SOLN: INTRAVENOUS | Status: AC

## 2021-03-26 MED ORDER — IOHEXOL 350 MG/ML SOLN
INTRAVENOUS | Status: DC | PRN
  Administered 2021-03-26: 70 mL

## 2021-03-26 MED ORDER — HEPARIN SODIUM (PORCINE) 1000 UNIT/ML IJ SOLN
INTRAMUSCULAR | Status: DC | PRN
  Administered 2021-03-26: 4500 [IU] via INTRAVENOUS

## 2021-03-26 MED ORDER — MIDAZOLAM HCL 2 MG/2ML IJ SOLN
INTRAMUSCULAR | Status: DC | PRN
  Administered 2021-03-26: 1 mg via INTRAVENOUS

## 2021-03-26 MED ORDER — FENTANYL CITRATE (PF) 100 MCG/2ML IJ SOLN
INTRAMUSCULAR | Status: AC
  Filled 2021-03-26: qty 2

## 2021-03-26 MED ORDER — HEPARIN (PORCINE) IN NACL 1000-0.9 UT/500ML-% IV SOLN
INTRAVENOUS | Status: AC
  Filled 2021-03-26: qty 1000

## 2021-03-26 MED ORDER — HEPARIN SODIUM (PORCINE) 1000 UNIT/ML IJ SOLN
INTRAMUSCULAR | Status: AC
  Filled 2021-03-26: qty 10

## 2021-03-26 MED ORDER — ONDANSETRON HCL 4 MG/2ML IJ SOLN: 4.0000 mg | Freq: Four times a day (QID) | INTRAMUSCULAR | Status: DC | PRN

## 2021-03-26 MED ORDER — SODIUM CHLORIDE 0.9 % IV SOLN: INTRAVENOUS | Status: DC

## 2021-03-26 MED ORDER — LABETALOL HCL 5 MG/ML IV SOLN: 10.0000 mg | INTRAVENOUS | Status: DC | PRN

## 2021-03-26 MED ORDER — VERAPAMIL HCL 2.5 MG/ML IV SOLN
INTRAVENOUS | Status: AC
  Filled 2021-03-26: qty 2

## 2021-03-26 MED ORDER — ASPIRIN 81 MG PO CHEW: 81.0000 mg | CHEWABLE_TABLET | ORAL | Status: DC

## 2021-03-26 MED ORDER — LIDOCAINE HCL (PF) 1 % IJ SOLN
INTRAMUSCULAR | Status: DC | PRN
  Administered 2021-03-26 (×2): 2 mL

## 2021-03-26 MED ORDER — HYDRALAZINE HCL 20 MG/ML IJ SOLN: 10.0000 mg | INTRAMUSCULAR | Status: DC | PRN

## 2021-03-26 MED ORDER — VERAPAMIL HCL 2.5 MG/ML IV SOLN
INTRAVENOUS | Status: DC | PRN
  Administered 2021-03-26: 10 mL via INTRA_ARTERIAL

## 2021-03-26 MED ORDER — MIDAZOLAM HCL 2 MG/2ML IJ SOLN
INTRAMUSCULAR | Status: AC
  Filled 2021-03-26: qty 2

## 2021-03-26 SURGICAL SUPPLY — 12 items
CATH 5FR JL3.5 JR4 ANG PIG MP (CATHETERS) ×1 IMPLANT
CATH BALLN WEDGE 5F 110CM (CATHETERS) ×1 IMPLANT
DEVICE RAD COMP TR BAND LRG (VASCULAR PRODUCTS) ×1 IMPLANT
GLIDESHEATH SLEND SS 6F .021 (SHEATH) ×2 IMPLANT
GUIDEWIRE .025 260CM (WIRE) ×1 IMPLANT
GUIDEWIRE INQWIRE 1.5J.035X260 (WIRE) IMPLANT
INQWIRE 1.5J .035X260CM (WIRE) ×2
KIT HEART LEFT (KITS) ×2 IMPLANT
PACK CARDIAC CATHETERIZATION (CUSTOM PROCEDURE TRAY) ×2 IMPLANT
SHEATH GLIDE SLENDER 4/5FR (SHEATH) ×1 IMPLANT
TRANSDUCER W/STOPCOCK (MISCELLANEOUS) ×2 IMPLANT
TUBING CIL FLEX 10 FLL-RA (TUBING) ×2 IMPLANT

## 2021-03-26 NOTE — Discharge Instructions (Signed)
Please have patient increase Lasix to 40 mg daily.

## 2021-03-26 NOTE — Interval H&P Note (Signed)
History and Physical Interval Note:  03/26/2021 7:11 AM  [name]  has presented today for surgery, with the diagnosis of heart failure - aortic stenosis.  The various methods of treatment have been discussed with the patient and family. After consideration of risks, benefits and other options for treatment, the patient has consented to  Procedure(s): RIGHT/LEFT HEART CATH AND CORONARY ANGIOGRAPHY (N/A) as a surgical intervention.  The patient's history has been reviewed, patient examined, no change in status, stable for surgery.  I have reviewed the patient's chart and labs.  Questions were answered to the patient's satisfaction.    Cath Lab Visit (complete for each Cath Lab visit)  Clinical Evaluation Leading to the Procedure:   ACS: No.  Non-ACS:    Anginal Classification: No Symptoms  Anti-ischemic medical therapy: One medication  Non-Invasive Test Results: No non-invasive testing performed  Prior CABG: No previous CABG        Verne Carrow

## 2021-03-27 ENCOUNTER — Encounter (HOSPITAL_COMMUNITY): Payer: Self-pay | Admitting: Cardiovascular Disease

## 2021-03-29 NOTE — Progress Notes (Signed)
Thanks Thayer Ohm, I think the CT sounds like a good idea. Thayer Ohm

## 2021-04-14 ENCOUNTER — Other Ambulatory Visit: Payer: Self-pay

## 2021-04-14 ENCOUNTER — Ambulatory Visit
Admission: RE | Admit: 2021-04-14 | Discharge: 2021-04-14 | Disposition: A | Discharge status: Home / Self Care | Payer: Medicare Other | Source: Ambulatory Visit | Attending: Cardiology | Admitting: Cardiology

## 2021-04-14 DIAGNOSIS — K7689 Other specified diseases of liver: Secondary | ICD-10-CM | POA: Diagnosis not present

## 2021-04-14 DIAGNOSIS — I517 Cardiomegaly: Secondary | ICD-10-CM | POA: Diagnosis not present

## 2021-04-14 DIAGNOSIS — J90 Pleural effusion, not elsewhere classified: Secondary | ICD-10-CM | POA: Diagnosis not present

## 2021-04-14 MED ORDER — GADOBENATE DIMEGLUMINE 529 MG/ML IV SOLN
18.0000 mL | Freq: Once | INTRAVENOUS | Status: AC | PRN
  Administered 2021-04-14: 18 mL via INTRAVENOUS

## 2021-04-16 ENCOUNTER — Telehealth (INDEPENDENT_AMBULATORY_CARE_PROVIDER_SITE_OTHER): Payer: Medicare Other | Admitting: Family Medicine

## 2021-04-16 ENCOUNTER — Encounter (INDEPENDENT_AMBULATORY_CARE_PROVIDER_SITE_OTHER): Payer: Self-pay | Admitting: Family Medicine

## 2021-04-16 ENCOUNTER — Other Ambulatory Visit: Payer: Self-pay

## 2021-04-16 DIAGNOSIS — Z6828 Body mass index (BMI) 28.0-28.9, adult: Secondary | ICD-10-CM | POA: Diagnosis not present

## 2021-04-16 DIAGNOSIS — R7303 Prediabetes: Secondary | ICD-10-CM

## 2021-04-16 DIAGNOSIS — E559 Vitamin D deficiency, unspecified: Secondary | ICD-10-CM | POA: Diagnosis not present

## 2021-04-16 DIAGNOSIS — E669 Obesity, unspecified: Secondary | ICD-10-CM

## 2021-04-16 MED ORDER — VITAMIN D (ERGOCALCIFEROL) 1.25 MG (50000 UNIT) PO CAPS: 50000.0000 [IU] | ORAL_CAPSULE | ORAL | 0 refills | Status: DC

## 2021-04-17 ENCOUNTER — Telehealth: Payer: Self-pay

## 2021-04-19 NOTE — Progress Notes (Signed)
Cardiology Office Note:    Date:  04/20/2021   ID:  [name], DOB Dec 21, 1945, MRN [id number]  PCP:  Debbrah Alar, NP  Cardiologist:  Donato Heinz, MD  Electrophysiologist:  None   Referring MD: Debbrah Alar, NP   Chief Complaint  Patient presents with   Congestive Heart Failure     History of Present Illness:    [name] is a 76 y.o. male with a hx of nonischemic cardiomyopathy, hypertension, OSA, alcohol abuse, hyperlipidemia who presents for follow-up.  Previously followed with Dr. Harrington Challenger.  Coronary CTA in 2008 showed anomalous RCA off the left main with course between pulmonary artery and aorta, calcified plaque in proximal LAD causing less than 50% stenosis, calcium score to 48.  Echocardiogram 12/15/2018 showed EF 35 to 40%.  Echocardiogram on 12/05/2020 showed EF less than 123456, grade 2 diastolic dysfunction, mild RV dysfunction, moderate left atrial dilatation, severe right atrial dilatation, possible low-flow low gradient severe aortic stenosis.  Lexiscan Myoview on 12/05/2020 showed EF 12%, normal perfusion.  Cardiac MRI on 02/04/2021 showed severe LV dilatation, EF 15%, mid myocardial LGE, RV EF 43%, moderate MR (regurgitant fraction 27%).  LHC/RHC on 03/26/2021 showed nonobstructive CAD (20% proximal RCA, 30% distal LAD, 30% mid LAD), anomalous takeoff of RCA from left main, no gradient across aortic valve, RA 5, RV 49/2, PA 57/16/37, PCWP 30, LVEDP 19, CI 2.2.  Since last clinic visit, he reports that he has been doing okay.  Does report dyspnea on exertion.  States that he gets short of breath, particularly when he walks up his driveway.  Denies any chest pain, lightheadedness, syncope, lower extremity edema, or palpitations.   Wt Readings from Last 3 Encounters:  04/20/21 207 lb 9.6 oz (94.2 kg)  03/26/21 195 lb (88.5 kg)  03/18/21 205 lb (93 kg)     Past Medical History:  Diagnosis Date   Alcohol abuse    Anomalous right coronary artery     Chronic systolic CHF (congestive heart failure) ([place]) 08/18/2016   Echo 1/17:  Diff HK especially in inf wall, mild LVH, EF 30-35, mild AS (mean 10, peak 18) // Echo 11/15: Mild LVH, EF 30-35, moderate HK, Gr 1 DD, mild AI, mild RAE // Echo 8/18: Mild LVH, EF 30-35, diffuse HK, mild aortic stenosis (mean 7), mild AI, mild BAE   Coronary artery calcification 08/18/2016   Cor CTA 1/08: Ca score 248 // LHC 1/08: oLAD 25%   History of ETOH abuse    Hyperlipidemia 08/27/2013   Hypertension    Lactose intolerance    Non-ischemic cardiomyopathy ([place])    Presumed secondary to Etoh and HTN  (Prev EF 20-25% -- improved to 50%) // LHC 1/08: oLAD 25, EF 25%    OSA (obstructive sleep apnea) 10/17/2013   Severe per home sleep study performed on 09/26/13.    Sleep apnea    SOB (shortness of breath)    Vitamin D deficiency     Past Surgical History:  Procedure Laterality Date   RIGHT/LEFT HEART CATH AND CORONARY ANGIOGRAPHY N/A 03/26/2021   Procedure: RIGHT/LEFT HEART CATH AND CORONARY ANGIOGRAPHY;  Surgeon: Burnell Blanks, MD;  Location: [facility];  Service: Cardiovascular;  Laterality: N/A;    Current Medications: Current Meds  Medication Sig   aspirin EC 81 MG tablet Take 1 tablet (81 mg total) by mouth daily.   atorvastatin (LIPITOR) 10 MG tablet TAKE 1 TABLET BY MOUTH ONCE DAILY AT  6PM (Patient taking  differently: Take 10 mg by mouth daily.)   carvedilol (COREG) 6.25 MG tablet TAKE 1 TABLET BY MOUTH TWICE DAILY WITH A MEAL   Coenzyme Q10 100 MG capsule Take 100 mg by mouth daily.   dapagliflozin propanediol (FARXIGA) 10 MG TABS tablet Take 1 tablet (10 mg total) by mouth daily before breakfast.   furosemide (LASIX) 40 MG tablet Take 1 tablet (40 mg total) by mouth daily.   losartan (COZAAR) 25 MG tablet Take 1 tablet (25 mg total) by mouth daily.   melatonin 5 MG TABS Take 5 mg by mouth at bedtime.   polyethylene glycol (MIRALAX / GLYCOLAX) 17 g packet Take 17 g by mouth at  bedtime.   sildenafil (REVATIO) 20 MG tablet Take 1 tablet (20 mg total) by mouth 3 (three) times daily.   spironolactone (ALDACTONE) 25 MG tablet Take 1/2 (one-half) tablet by mouth once daily (Patient taking differently: Take 12.5 mg by mouth daily.)   Vitamin D, Ergocalciferol, (DRISDOL) 1.25 MG (50000 UNIT) CAPS capsule Take 1 capsule (50,000 Units total) by mouth once a week.   [DISCONTINUED] furosemide (LASIX) 20 MG tablet Take 1 tablet (20 mg total) by mouth daily.     Allergies:   Lisinopril   Social History   Socioeconomic History   Marital status: Married    Spouse name: Constance Holster   Number of children: 3   Years of education: Not on file   Highest education level: Not on file  Occupational History   Occupation: Retired Development worker, community  Tobacco Use   Smoking status: Former    Years: 20.00    Types: Cigarettes    Quit date: 04/12/2002    Years since quitting: 19.0   Smokeless tobacco: Never  Vaping Use   Vaping Use: Never used  Substance and Sexual Activity   Alcohol use: Not Currently    Alcohol/week: 0.0 standard drinks   Drug use: No    Comment: history of marijuana use   Sexual activity: Yes  Other Topics Concern   Not on file  Social History Narrative   2 sons and daughter   1 son in Nature conservation officer. Living with wife 40yrs Constance Holster).  Use to live near [place].   Social Determinants of Health   Financial Resource Strain: Not on file  Food Insecurity: Not on file  Transportation Needs: No Transportation Needs   Lack of Transportation (Medical): No   Lack of Transportation (Non-Medical): No  Physical Activity: Inactive   Days of Exercise per Week: 0 days   Minutes of Exercise per Session: 0 min  Stress: Not on file  Social Connections: Moderately Isolated   Frequency of Communication with Friends and Family: More than three times a week   Frequency of Social Gatherings with Friends and Family: More than three times a week   Attends Religious Services: Never   Corporate treasurer or Organizations: No   Attends Music therapist: Never   Marital Status: Married     Family History: The patient's family history includes Alcohol abuse in his father and mother; Heart disease in his maternal grandmother and mother; Heart failure in his father; High Cholesterol in his mother; High blood pressure in his mother; Stroke in his maternal grandmother; Sudden death in his mother. There is no history of Diabetes.  ROS:   Please see the history of present illness.     All other systems reviewed and are negative.  EKGs/Labs/Other Studies Reviewed:    The following studies were  reviewed today:   EKG:   04/20/21:NSR, rate 95, first degree AV block, nonspecific interventricular conduction delay, PVCs 02/24/21: NSR, first degree AV block, rate 74, nonspecific interventricular conduction delay, PVC   Recent Labs: 12/12/2020: TSH 1.570 02/16/2021: ALT 11 02/24/2021: Platelets 244 03/26/2021: Hemoglobin 16.7 04/20/2021: BUN 15; Creatinine, Ser 1.12; Magnesium 2.2; Potassium 4.2; Sodium 138  Recent Lipid Panel    Component Value Date/Time   CHOL 136 02/16/2021 0903   TRIG 65 02/16/2021 0903   HDL 52 02/16/2021 0903   CHOLHDL 2.6 06/25/2020 0842   CHOLHDL 3 04/16/2019 0738   VLDL 28.0 04/16/2019 0738   LDLCALC 71 02/16/2021 0903    Physical Exam:    VS:  BP 118/72    Pulse 95    Ht 5\' 10"  (1.778 m)    Wt 207 lb 9.6 oz (94.2 kg)    BMI 29.79 kg/m     Wt Readings from Last 3 Encounters:  04/20/21 207 lb 9.6 oz (94.2 kg)  03/26/21 195 lb (88.5 kg)  03/18/21 205 lb (93 kg)     GEN:  Well nourished, well developed in no acute distress HEENT: Normal NECK: + JVD CARDIAC: Irregular, normal rate, no murmurs, rubs, gallops RESPIRATORY:  Clear to auscultation without rales, wheezing or rhonchi  ABDOMEN: Soft, non-tender, non-distended MUSCULOSKELETAL:  No edema; No deformity  SKIN: Warm and dry NEUROLOGIC:  Alert and oriented x 3 PSYCHIATRIC:  Normal affect    ASSESSMENT:    1. Chronic combined systolic (congestive) and diastolic (congestive) heart failure ([place])   2. Essential hypertension   3. Pre-op evaluation   4. Hyperlipidemia, unspecified hyperlipidemia type   5. Coronary artery disease involving native coronary artery of native heart without angina pectoris   6. Aortic valve stenosis, etiology of cardiac valve disease unspecified       PLAN:    Chronic combined systolic and diastolic heart failure: Coronary CTA in 2008 showed anomalous RCA off the left main with scores between pulmonary artery and aorta, calcified plaque in proximal LAD causing less than 50% stenosis, calcium score 248.  Echocardiogram 12/15/2018 showed EF 35 to 40%.  Echocardiogram on 12/05/2020 showed EF less than 123456, grade 2 diastolic dysfunction, mild RV dysfunction, moderate left atrial dilatation, severe right atrial dilatation, possible low-flow low gradient severe aortic stenosis.  Lexiscan Myoview on 12/05/2020 showed EF 12%, normal perfusion.  Cardiac MRI on 02/04/2021 showed severe LV dilatation, EF 15%, mid myocardial LGE, RV EF 43%, moderate MR (regurgitant fraction 27%).  LHC/RHC on 03/26/2021 showed nonobstructive CAD (20% proximal RCA, 30% distal LAD, 30% mid LAD), anomalous takeoff of RCA from left main, no gradient across aortic valve, RA 5, RV 49/2, PA 57/16/37, PCWP 30, LVEDP 19, CI 2.2. -History of angioedema with lisinopril, unable to do Entresto.  Continue losartan 25 mg daily -Continue carvedilol 6.25 mg twice daily -Continue spironolactone 12.5 mg daily -Continue Farxiga 10 mg daily -Continue Lasix, increased dose to 40 mg daily given cath results.  Check BMET -Plan repeat echocardiogram prior to next clinic visit.  If EF remains reduced, will refer to EP for ICD evaluation -Refer to Advanced Heart Failure  Aortic stenosis: echocardiogram on 12/05/2020 showed possible low-flow low gradient severe aortic stenosis, though mean gradient only 7 mmHg.   Aortic valve calcium score was only 327, not consistent with severe AS.  No gradient across valve on cath  Mitral regurgitation: Moderate on MRI (27% regurgitant fraction).  Will monitor with treatment of heart failure  Anomalous RCA  off left main with interarterial course: Lexiscan Myoview on 12/05/2020 showed EF 12%, normal perfusion.  CAD: Calcium score 1852 on 01/08/2021 (72nd percentile).  Lexiscan Myoview on 12/05/2020 showed normal perfusion.  Nonobstructive CAD on cath 03/26/2021 (20% proximal RCA, 30% distal LAD, 30% mid LAD), anomalous takeoff of RCA from left main -Continue aspirin 81 mg daily -Continue atorvastatin 10 mg daily  PVCs: Frequent PVCs noted on EKG in clinic, however Zio patch x3 days on 11/27/2020 showed only 2.4% PVC burden  Hypertension: Continue carvedilol 6.25 mg twice daily and spironolactone 12.5 mg daily and losartan 25 mgdaily  Hyperlipidemia: Continue atorvastatin 10 mg daily.  LDL 65 on 06/25/2020  OSA: on CPAP  Liver lesion: MRI abdomen confirmed cyst, no suspicious lesion  Preop evaluation:planning colonoscopy later this month after Cologuard positive stool.  Nonobstructive CAD on cath.  He is at elevated risk given his severe heart failure, however appears optimized.  No further cardiac work-up recommended prior to procedure.     RTC in 1 month    Medication Adjustments/Labs and Tests Ordered: Current medicines are reviewed at length with the patient today.  Concerns regarding medicines are outlined above.  Orders Placed This Encounter  Procedures   Basic metabolic panel   Magnesium   AMB referral to CHF clinic   EKG 12-Lead   ECHOCARDIOGRAM COMPLETE      Meds ordered this encounter  Medications   furosemide (LASIX) 40 MG tablet    Sig: Take 1 tablet (40 mg total) by mouth daily.    Dispense:  90 tablet    Refill:  3      Patient Instructions  Medication Instructions:  Increase Lasix to 40 mg daily Continue all other  medications *If you need a refill on your cardiac medications before your next appointment, please call your pharmacy*   Lab Work: Bmet and magnesium today   Testing/Procedures: Echo   Follow-Up: At [facility], you and your health needs are our priority.  As part of our continuing mission to provide you with exceptional heart care, we have created designated Provider Care Teams.  These Care Teams include your primary Cardiologist (physician) and Advanced Practice Providers (APPs -  Physician Assistants and Nurse Practitioners) who all work together to provide you with the care you need, when you need it.  We recommend signing up for the patient portal called "MyChart".  Sign up information is provided on this After Visit Summary.  MyChart is used to connect with patients for Virtual Visits (Telemedicine).  Patients are able to view lab/test results, encounter notes, upcoming appointments, etc.  Non-urgent messages can be sent to your provider as well.   To learn more about what you can do with MyChart, go to [email or web address].        Schedule appointment with Heart Failure Clinic     Your next appointment:  1 month    The format for your next appointment: Office     Provider:  Dr.Daysie Helf     Signed, Donato Heinz, MD  04/20/2021 11:07 PM    [place]

## 2021-04-20 ENCOUNTER — Ambulatory Visit (INDEPENDENT_AMBULATORY_CARE_PROVIDER_SITE_OTHER): Payer: Medicare Other | Admitting: Cardiology

## 2021-04-20 ENCOUNTER — Encounter: Payer: Self-pay | Admitting: Cardiology

## 2021-04-20 ENCOUNTER — Other Ambulatory Visit: Payer: Self-pay

## 2021-04-20 VITALS — BP 118/72 | HR 95 | Ht 70.0 in | Wt 207.6 lb

## 2021-04-20 DIAGNOSIS — I2510 Atherosclerotic heart disease of native coronary artery without angina pectoris: Secondary | ICD-10-CM

## 2021-04-20 DIAGNOSIS — I10 Essential (primary) hypertension: Secondary | ICD-10-CM

## 2021-04-20 DIAGNOSIS — I5022 Chronic systolic (congestive) heart failure: Secondary | ICD-10-CM | POA: Diagnosis not present

## 2021-04-20 DIAGNOSIS — I350 Nonrheumatic aortic (valve) stenosis: Secondary | ICD-10-CM | POA: Diagnosis not present

## 2021-04-20 DIAGNOSIS — E785 Hyperlipidemia, unspecified: Secondary | ICD-10-CM | POA: Diagnosis not present

## 2021-04-20 DIAGNOSIS — I5042 Chronic combined systolic (congestive) and diastolic (congestive) heart failure: Secondary | ICD-10-CM | POA: Diagnosis not present

## 2021-04-20 DIAGNOSIS — Z01810 Encounter for preprocedural cardiovascular examination: Secondary | ICD-10-CM

## 2021-04-20 DIAGNOSIS — Z01818 Encounter for other preprocedural examination: Secondary | ICD-10-CM

## 2021-04-20 LAB — BASIC METABOLIC PANEL
BUN/Creatinine Ratio: 13 (ref 10–24)
BUN: 15 mg/dL (ref 8–27)
CO2: 24 mmol/L (ref 20–29)
Calcium: 9.5 mg/dL (ref 8.6–10.2)
Chloride: 100 mmol/L (ref 96–106)
Creatinine, Ser: 1.12 mg/dL (ref 0.76–1.27)
Glucose: 91 mg/dL (ref 70–99)
Potassium: 4.2 mmol/L (ref 3.5–5.2)
Sodium: 138 mmol/L (ref 134–144)
eGFR: 69 mL/min/{1.73_m2} (ref >59)

## 2021-04-20 LAB — MAGNESIUM: Magnesium: 2.2 mg/dL (ref 1.6–2.3)

## 2021-04-20 MED ORDER — FUROSEMIDE 40 MG PO TABS: 40.0000 mg | ORAL_TABLET | Freq: Every day | ORAL | 3 refills | Status: DC

## 2021-04-20 NOTE — Progress Notes (Signed)
TeleHealth Visit:  Due to the COVID-19 pandemic, this visit was completed with telemedicine (audio/video) technology to reduce patient and provider exposure as well as to preserve personal protective equipment.   [name] has verbally consented to this TeleHealth visit. The patient is located at home, the provider is located at the [facility] office. The participants in this visit include the listed provider and patient. The visit was conducted today via MyChart video.   Chief Complaint: OBESITY [name] is here to discuss his progress with his obesity treatment plan along with follow-up of his obesity related diagnoses. [name] is on keeping a food journal and adhering to recommended goals of 1200-1600 calories and 85+ grams of protein daily and states he is following his eating plan approximately 40-50% of the time. [name] states he is doing 0 minutes 0 times per week.  Today's visit was #: 23 Starting weight: 223 lbs Starting date: 08/15/2019  Interim History: [name] feels he has done well with maintaining his weight over the holidays. He is recovering from PTCA, and he feels well. He is limiting his activity per Cardiology, but he hopes to get back to this soon.  Subjective:   1. Vitamin D deficiency [name]'s recent Vit D level was at goal. No side effects were noted, and he requests a refill.  2. Pre-diabetes [name] continues to work on diet and exercise to control and prevent diabetes mellitus. No hypoglycemia was noted.  Assessment/Plan:   1. Vitamin D deficiency We will refill prescription Vitamin D 50,000 IU every week for 1 month. [name] will follow-up for routine testing of Vitamin D, at least 2-3 times per year to avoid over-replacement.  - Vitamin D, Ergocalciferol, (DRISDOL) 1.25 MG (50000 UNIT) CAPS capsule; Take 1 capsule (50,000 Units total) by mouth once a week.  Dispense: 4 capsule; Refill: 0  2. Pre-diabetes Mayra will continue to increase lean protein and decrease  simple carbohydrates to help decrease the risk of diabetes. He will continue to follow up as directed.   3. Obesity with current BMI 28.0 [name] is currently in the action stage of change. As such, his goal is to continue with weight loss efforts. He has agreed to keeping a food journal and adhering to recommended goals of 1200-1600 calories and 85+ grams of protein daily.   Behavioral modification strategies: meal planning and cooking strategies.  [name] has agreed to follow-up with our clinic in 3 to 4 weeks. He was informed of the importance of frequent follow-up visits to maximize his success with intensive lifestyle modifications for his multiple health conditions.  Objective:   VITALS: Per patient if applicable, see vitals. GENERAL: Alert and in no acute distress. CARDIOPULMONARY: No increased WOB. Speaking in clear sentences.  PSYCH: Pleasant and cooperative. Speech normal rate and rhythm. Affect is appropriate. Insight and judgement are appropriate. Attention is focused, linear, and appropriate.  NEURO: Oriented as arrived to appointment on time with no prompting.   Lab Results  Component Value Date   CREATININE 1.24 02/24/2021   BUN 17 02/24/2021   NA 141 03/26/2021   K 3.9 03/26/2021   CL 100 02/24/2021   CO2 26 02/24/2021   Lab Results  Component Value Date   ALT 11 02/16/2021   AST 18 02/16/2021   ALKPHOS 71 02/16/2021   BILITOT 0.9 02/16/2021   Lab Results  Component Value Date   HGBA1C 5.8 (H) 02/16/2021   HGBA1C 5.7 (H) 06/25/2020   HGBA1C 5.5 02/12/2020   HGBA1C  5.9 (H) 11/13/2019   HGBA1C 6.1 (H) 08/15/2019   Lab Results  Component Value Date   INSULIN 14.9 02/16/2021   INSULIN 6.3 06/25/2020   INSULIN 7.6 02/12/2020   INSULIN 10.2 11/13/2019   INSULIN 10.0 08/15/2019   Lab Results  Component Value Date   TSH 1.570 12/12/2020   Lab Results  Component Value Date   CHOL 136 02/16/2021   HDL 52 02/16/2021   LDLCALC 71 02/16/2021   TRIG 65 02/16/2021    CHOLHDL 2.6 06/25/2020   Lab Results  Component Value Date   VD25OH 58.8 02/16/2021   VD25OH 53.4 06/25/2020   VD25OH 63.0 02/12/2020   Lab Results  Component Value Date   WBC 4.9 02/24/2021   HGB 16.7 03/26/2021   HCT 49.0 03/26/2021   MCV 81 02/24/2021   PLT 244 02/24/2021   No results found for: IRON, TIBC, FERRITIN  Attestation Statements:   Reviewed by clinician on day of visit: allergies, medications, problem list, medical history, surgical history, family history, social history, and previous encounter notes.   I, Trixie Dredge, am acting as transcriptionist for Dennard Nip, MD.  I have reviewed the above documentation for accuracy and completeness, and I agree with the above. - Dennard Nip, MD

## 2021-04-20 NOTE — Patient Instructions (Signed)
Medication Instructions:  Increase Lasix to 40 mg daily Continue all other medications *If you need a refill on your cardiac medications before your next appointment, please call your pharmacy*   Lab Work: Bmet and magnesium today   Testing/Procedures: Echo   Follow-Up: At [facility], you and your health needs are our priority.  As part of our continuing mission to provide you with exceptional heart care, we have created designated Provider Care Teams.  These Care Teams include your primary Cardiologist (physician) and Advanced Practice Providers (APPs -  Physician Assistants and Nurse Practitioners) who all work together to provide you with the care you need, when you need it.  We recommend signing up for the patient portal called "MyChart".  Sign up information is provided on this After Visit Summary.  MyChart is used to connect with patients for Virtual Visits (Telemedicine).  Patients are able to view lab/test results, encounter notes, upcoming appointments, etc.  Non-urgent messages can be sent to your provider as well.   To learn more about what you can do with MyChart, go to [email or web address].        Schedule appointment with Heart Failure Clinic     Your next appointment:  1 month    The format for your next appointment: Office     Provider:  Dr.Schumann

## 2021-04-21 ENCOUNTER — Other Ambulatory Visit: Payer: Self-pay | Admitting: *Deleted

## 2021-04-21 DIAGNOSIS — I5042 Chronic combined systolic (congestive) and diastolic (congestive) heart failure: Secondary | ICD-10-CM

## 2021-04-23 ENCOUNTER — Encounter (HOSPITAL_COMMUNITY): Payer: Self-pay | Admitting: Gastroenterology

## 2021-04-23 NOTE — Progress Notes (Signed)
Attempted to obtain medical history via telephone, unable to reach at this time. I left a voicemail to return pre surgical testing department's phone call.  

## 2021-04-27 ENCOUNTER — Encounter (HOSPITAL_COMMUNITY): Payer: Self-pay | Admitting: Anesthesiology

## 2021-04-29 ENCOUNTER — Ambulatory Visit (HOSPITAL_COMMUNITY): Payer: Medicare Other | Attending: Cardiology

## 2021-04-29 ENCOUNTER — Other Ambulatory Visit: Payer: Self-pay

## 2021-04-29 DIAGNOSIS — I5042 Chronic combined systolic (congestive) and diastolic (congestive) heart failure: Secondary | ICD-10-CM | POA: Diagnosis not present

## 2021-04-29 DIAGNOSIS — I10 Essential (primary) hypertension: Secondary | ICD-10-CM | POA: Diagnosis not present

## 2021-04-29 LAB — ECHOCARDIOGRAM COMPLETE
AR max vel: 2.55 cm2
AV Area VTI: 2.28 cm2
AV Area mean vel: 2.39 cm2
AV Mean grad: 4.8 mmHg
AV Peak grad: 8.3 mmHg
Ao pk vel: 1.44 m/s
P 1/2 time: 215 msec
S' Lateral: 5.8 cm

## 2021-04-29 MED ORDER — PERFLUTREN LIPID MICROSPHERE
1.0000 mL | INTRAVENOUS | Status: AC | PRN
  Administered 2021-04-29: 1 mL via INTRAVENOUS

## 2021-04-30 ENCOUNTER — Other Ambulatory Visit: Payer: Self-pay | Admitting: *Deleted

## 2021-04-30 DIAGNOSIS — I428 Other cardiomyopathies: Secondary | ICD-10-CM

## 2021-05-01 ENCOUNTER — Telehealth: Payer: Self-pay

## 2021-05-01 ENCOUNTER — Ambulatory Visit (INDEPENDENT_AMBULATORY_CARE_PROVIDER_SITE_OTHER): Payer: Medicare Other | Admitting: Family

## 2021-05-01 VITALS — BP 99/57 | HR 79 | Temp 97.8°F | Resp 16 | Ht 70.0 in | Wt 201.8 lb

## 2021-05-01 DIAGNOSIS — E663 Overweight: Secondary | ICD-10-CM

## 2021-05-01 DIAGNOSIS — R7303 Prediabetes: Secondary | ICD-10-CM

## 2021-05-01 DIAGNOSIS — E559 Vitamin D deficiency, unspecified: Secondary | ICD-10-CM

## 2021-05-01 DIAGNOSIS — I5022 Chronic systolic (congestive) heart failure: Secondary | ICD-10-CM

## 2021-05-01 DIAGNOSIS — I2510 Atherosclerotic heart disease of native coronary artery without angina pectoris: Secondary | ICD-10-CM

## 2021-05-01 LAB — BASIC METABOLIC PANEL
BUN: 16 mg/dL (ref 6–23)
CO2: 29 mEq/L (ref 19–32)
Calcium: 9.5 mg/dL (ref 8.4–10.5)
Chloride: 100 mEq/L (ref 96–112)
Creatinine, Ser: 1.22 mg/dL (ref 0.40–1.50)
GFR: 58.49 mL/min — ABNORMAL LOW (ref >60.00)
Glucose, Bld: 79 mg/dL (ref 70–99)
Potassium: 3.9 mEq/L (ref 3.5–5.1)
Sodium: 137 mEq/L (ref 135–145)

## 2021-05-01 NOTE — Assessment & Plan Note (Signed)
States that he has one 62446 iu tablet left.

## 2021-05-01 NOTE — Telephone Encounter (Signed)
Patient returned call and advised that colonoscopy has been canceled for 05-04-21.  Patient advised that Dr Christella Hartigan does not feel that it is safe to proceed at this point and would prefer that patient be seen in the office in 1 - 2 months to further discuss colonoscopy.  Patient scheduled for 06-23-21 at 8:50am.  Patient agreed to plan and verbalized understanding.  No further questions.

## 2021-05-01 NOTE — Assessment & Plan Note (Signed)
A1C has been stable. He is now on [place] for cardiac purposes which has the added benefit of helping his sugar.

## 2021-05-01 NOTE — Assessment & Plan Note (Signed)
Appears euvolemic.  Management per cardiology.

## 2021-05-01 NOTE — Assessment & Plan Note (Signed)
He continues to work on weight loss and follows with [facility].

## 2021-05-01 NOTE — Patient Instructions (Signed)
Please complete lab work prior to leaving.   

## 2021-05-01 NOTE — Telephone Encounter (Signed)
Left message for patient to return call to discuss colonoscopy being canceled for 05-04-21.  Patient will need a follow up appointment in 1-2 months to discuss options at that time.  Will continue efforts.

## 2021-05-01 NOTE — Telephone Encounter (Signed)
-----   Message from Milus Banister, MD sent at 04/30/2021  3:49 PM EST ----- Regarding: RE: Colonoscopy 05-04-21 Not safe to proceed at this point.  Let's get him in for an OV in 1-2 months to see how he's doing, discuss options at that point.    Thanks  ----- Message ----- From: Stevan Born, CMA Sent: 04/30/2021   2:01 PM EST To: Milus Banister, MD Subject: FW: Colonoscopy 05-04-21                        Dr Ardis Hughs  We have this patient scheduled for colonoscopy on 05-04-21.  I see where EF is still diminished after echo yesterday and patient is being referred for ICD placement.  Is it OK to proceed with colonoscopy or should we postpone??  Please advise, Elmyra Ricks ----- Message ----- From: Stevan Born, CMA Sent: 04/29/2021  12:00 AM EST To: Stevan Born, CMA Subject: FW: Colonoscopy 05-04-21                        Pt to have Echo on 04-29-21.  Cleared for colonoscopy?   ----- Message ----- From: Stevan Born, CMA Sent: 04/21/2021  12:00 AM EST To: Stevan Born, CMA Subject: RE: Colonoscopy 05-04-21                        Pt to have preop appt at [facility] on 04-20-21.  Cleared for colonoscopy? ----- Message ----- From: Stevan Born, CMA Sent: 03/30/2021  12:00 AM EST To: Stevan Born, CMA Subject: Colonoscopy 05-04-21                            Patient to have LHC/RHC on 03-28-21, check on EF.  Has it improved?  Is patient OK to proceed with colonoscopy.

## 2021-05-01 NOTE — Progress Notes (Signed)
Subjective:     Patient ID: [name], male    DOB: 1945/08/21, 76 y.o.   MRN: [id number]  Chief Complaint  Patient presents with   Hypertension    Here for follow up   Hyperlipidemia    Here for follow up    Hypertension Pertinent negatives include no shortness of breath.  Hyperlipidemia Pertinent negatives include no shortness of breath.  Patient is in today for follow up.  Hyperlipidemia-  Lab Results  Component Value Date   CHOL 136 02/16/2021   HDL 52 02/16/2021   LDLCALC 71 02/16/2021   TRIG 65 02/16/2021   CHOLHDL 2.6 06/25/2020   Maintained on lipitor 10mg .   Vit D deficiency- he completes his weekly vit D 50000 units this month.  Lab Results  Component Value Date   VD25OH 58.8 02/16/2021   HTN-  BP Readings from Last 3 Encounters:  05/01/21 (!) 99/57  04/20/21 118/72  03/26/21 94/71    Lab Results  Component Value Date   HGBA1C 5.8 (H) 02/16/2021   HGBA1C 5.7 (H) 06/25/2020   HGBA1C 5.5 02/12/2020   Lab Results  Component Value Date   LDLCALC 71 02/16/2021   CREATININE 1.12 04/20/2021   Has been working with the weight loss clinic.   Wt Readings from Last 3 Encounters:  05/01/21 201 lb 12.8 oz (91.5 kg)  04/20/21 207 lb 9.6 oz (94.2 kg)  03/26/21 195 lb (88.5 kg)       Health Maintenance Due  Topic Date Due   TETANUS/TDAP  01/28/2018    Past Medical History:  Diagnosis Date   Alcohol abuse    Anomalous right coronary artery    Chronic systolic CHF (congestive heart failure) ([place]) 08/18/2016   Echo 1/17:  Diff HK especially in inf wall, mild LVH, EF 30-35, mild AS (mean 10, peak 18) // Echo 11/15: Mild LVH, EF 30-35, moderate HK, Gr 1 DD, mild AI, mild RAE // Echo 8/18: Mild LVH, EF 30-35, diffuse HK, mild aortic stenosis (mean 7), mild AI, mild BAE   Coronary artery calcification 08/18/2016   Cor CTA 1/08: Ca score 248 // LHC 1/08: oLAD 25%   History of ETOH abuse    Hyperlipidemia 08/27/2013   Hypertension    Lactose  intolerance    Non-ischemic cardiomyopathy ([place])    Presumed secondary to Etoh and HTN  (Prev EF 20-25% -- improved to 50%) // LHC 1/08: oLAD 25, EF 25%    OSA (obstructive sleep apnea) 10/17/2013   Severe per home sleep study performed on 09/26/13.    Sleep apnea    SOB (shortness of breath)    Vitamin D deficiency     Past Surgical History:  Procedure Laterality Date   RIGHT/LEFT HEART CATH AND CORONARY ANGIOGRAPHY N/A 03/26/2021   Procedure: RIGHT/LEFT HEART CATH AND CORONARY ANGIOGRAPHY;  Surgeon: Burnell Blanks, MD;  Location: [facility];  Service: Cardiovascular;  Laterality: N/A;    Family History  Problem Relation Age of Onset   Alcohol abuse Mother    Heart disease Mother    High blood pressure Mother    High Cholesterol Mother    Sudden death Mother    Alcohol abuse Father    Heart failure Father    Heart disease Maternal Grandmother    Stroke Maternal Grandmother    Diabetes Neg Hx     Social History   Socioeconomic History   Marital status: Married    Spouse name: Constance Holster  Number of children: 3   Years of education: Not on file   Highest education level: Not on file  Occupational History   Occupation: Retired Development worker, community  Tobacco Use   Smoking status: Former    Years: 20.00    Types: Cigarettes    Quit date: 04/12/2002    Years since quitting: 19.0   Smokeless tobacco: Never  Vaping Use   Vaping Use: Never used  Substance and Sexual Activity   Alcohol use: Not Currently    Alcohol/week: 0.0 standard drinks   Drug use: No    Comment: history of marijuana use   Sexual activity: Yes  Other Topics Concern   Not on file  Social History Narrative   2 sons and daughter   1 son in Nature conservation officer. Living with wife 45yrs Constance Holster).  Use to live near [place].   Social Determinants of Health   Financial Resource Strain: Not on file  Food Insecurity: Not on file  Transportation Needs: No Transportation Needs   Lack of Transportation (Medical): No   Lack  of Transportation (Non-Medical): No  Physical Activity: Inactive   Days of Exercise per Week: 0 days   Minutes of Exercise per Session: 0 min  Stress: Not on file  Social Connections: Moderately Isolated   Frequency of Communication with Friends and Family: More than three times a week   Frequency of Social Gatherings with Friends and Family: More than three times a week   Attends Religious Services: Never   Marine scientist or Organizations: No   Attends Music therapist: Never   Marital Status: Married  Human resources officer Violence: Not At Risk   Fear of Current or Ex-Partner: No   Emotionally Abused: No   Physically Abused: No   Sexually Abused: No    Outpatient Medications Prior to Visit  Medication Sig Dispense Refill   aspirin EC 81 MG tablet Take 1 tablet (81 mg total) by mouth daily. 90 tablet 3   atorvastatin (LIPITOR) 10 MG tablet TAKE 1 TABLET BY MOUTH ONCE DAILY AT  6PM (Patient taking differently: Take 10 mg by mouth daily.) 90 tablet 0   carvedilol (COREG) 6.25 MG tablet TAKE 1 TABLET BY MOUTH TWICE DAILY WITH A MEAL 60 tablet 5   Coenzyme Q10 100 MG capsule Take 100 mg by mouth daily.     dapagliflozin propanediol (FARXIGA) 10 MG TABS tablet Take 1 tablet (10 mg total) by mouth daily before breakfast. 90 tablet 3   furosemide (LASIX) 40 MG tablet Take 1 tablet (40 mg total) by mouth daily. 90 tablet 3   losartan (COZAAR) 25 MG tablet Take 1 tablet (25 mg total) by mouth daily. 90 tablet 3   melatonin 5 MG TABS Take 5 mg by mouth at bedtime.     polyethylene glycol (MIRALAX / GLYCOLAX) 17 g packet Take 17 g by mouth at bedtime.     sildenafil (REVATIO) 20 MG tablet Take 1 tablet (20 mg total) by mouth 3 (three) times daily. 30 tablet 2   spironolactone (ALDACTONE) 25 MG tablet Take 1/2 (one-half) tablet by mouth once daily (Patient taking differently: Take 12.5 mg by mouth daily.) 45 tablet 2   Vitamin D, Ergocalciferol, (DRISDOL) 1.25 MG (50000 UNIT)  CAPS capsule Take 1 capsule (50,000 Units total) by mouth once a week. 4 capsule 0   No facility-administered medications prior to visit.    Allergies  Allergen Reactions   Lisinopril Swelling    ANGIOEDEMA  Review of Systems  Respiratory:  Negative for shortness of breath.   Cardiovascular:  Negative for leg swelling.      Objective:    Physical Exam Constitutional:      General: He is not in acute distress.    Appearance: He is well-developed.  HENT:     Head: Normocephalic and atraumatic.  Cardiovascular:     Rate and Rhythm: Normal rate and regular rhythm.     Heart sounds: No murmur heard. Pulmonary:     Effort: Pulmonary effort is normal. No respiratory distress.     Breath sounds: Normal breath sounds. No wheezing or rales.  Skin:    General: Skin is warm and dry.  Neurological:     Mental Status: He is alert and oriented to person, place, and time.  Psychiatric:        Behavior: Behavior normal.        Thought Content: Thought content normal.    BP (!) 99/57 (BP Location: Right Arm, Patient Position: Sitting, Cuff Size: Large)    Pulse 79    Temp 97.8 F (36.6 C) (Oral)    Resp 16    Ht 5\' 10"  (1.778 m)    Wt 201 lb 12.8 oz (91.5 kg)    SpO2 97%    BMI 28.96 kg/m  Wt Readings from Last 3 Encounters:  05/01/21 201 lb 12.8 oz (91.5 kg)  04/20/21 207 lb 9.6 oz (94.2 kg)  03/26/21 195 lb (88.5 kg)       Assessment & Plan:   Problem List Items Addressed This Visit       Unprioritized   Vitamin D deficiency    States that he has one 50000 iu tablet left.        Prediabetes    A1C has been stable. He is now on [place] for cardiac purposes which has the added benefit of helping his sugar.       Overweight (BMI 25.0-29.9)    He continues to work on weight loss and [facility].       Chronic systolic CHF (congestive heart failure) (HCC) - Primary    Appears euvolemic.  Management per cardiology.       Relevant  Orders   Basic metabolic panel    I am having [name] maintain his aspirin EC, sildenafil, spironolactone, dapagliflozin propanediol, carvedilol, losartan, atorvastatin, polyethylene glycol, Coenzyme Q10, melatonin, Vitamin D (Ergocalciferol), and furosemide.  No orders of the defined types were placed in this encounter.

## 2021-05-04 ENCOUNTER — Other Ambulatory Visit (INDEPENDENT_AMBULATORY_CARE_PROVIDER_SITE_OTHER): Payer: Self-pay | Admitting: Family Medicine

## 2021-05-04 ENCOUNTER — Ambulatory Visit (HOSPITAL_COMMUNITY): Admission: RE | Admit: 2021-05-04 | Payer: Medicare Other | Source: Home / Self Care | Admitting: Gastroenterology

## 2021-05-04 ENCOUNTER — Other Ambulatory Visit: Payer: Self-pay | Admitting: Internal Medicine

## 2021-05-04 DIAGNOSIS — E559 Vitamin D deficiency, unspecified: Secondary | ICD-10-CM

## 2021-05-04 SURGERY — COLONOSCOPY WITH PROPOFOL
Anesthesia: Monitor Anesthesia Care

## 2021-05-18 ENCOUNTER — Encounter (INDEPENDENT_AMBULATORY_CARE_PROVIDER_SITE_OTHER): Payer: Self-pay | Admitting: Family Medicine

## 2021-05-18 ENCOUNTER — Other Ambulatory Visit: Payer: Self-pay

## 2021-05-18 ENCOUNTER — Ambulatory Visit (INDEPENDENT_AMBULATORY_CARE_PROVIDER_SITE_OTHER): Payer: Medicare Other | Admitting: Family Medicine

## 2021-05-18 VITALS — BP 93/67 | HR 71 | Temp 98.2°F | Ht 70.0 in | Wt 195.0 lb

## 2021-05-18 DIAGNOSIS — E669 Obesity, unspecified: Secondary | ICD-10-CM | POA: Diagnosis not present

## 2021-05-18 DIAGNOSIS — E559 Vitamin D deficiency, unspecified: Secondary | ICD-10-CM | POA: Diagnosis not present

## 2021-05-18 DIAGNOSIS — Z6828 Body mass index (BMI) 28.0-28.9, adult: Secondary | ICD-10-CM

## 2021-05-18 DIAGNOSIS — I5022 Chronic systolic (congestive) heart failure: Secondary | ICD-10-CM

## 2021-05-18 NOTE — Progress Notes (Signed)
Cardiology Office Note:    Date:  05/21/2021   ID:  [name], DOB [birth year], MRN [id number]  PCP:  Debbrah Alar, NP  Cardiologist:  Donato Heinz, MD  Electrophysiologist:  None   Referring MD: Debbrah Alar, NP   Chief Complaint  Patient presents with   Congestive Heart Failure     History of Present Illness:    [name] is a 76 y.o. male with a hx of nonischemic cardiomyopathy, hypertension, OSA, alcohol abuse, hyperlipidemia who presents for follow-up.  Previously followed with Dr. Harrington Challenger.  Coronary CTA in 2008 showed anomalous RCA off the left main with course between pulmonary artery and aorta, calcified plaque in proximal LAD causing less than 50% stenosis, calcium score to 48.  Echocardiogram 12/15/2018 showed EF 35 to 40%.  Echocardiogram on 12/05/2020 showed EF less than 123456, grade 2 diastolic dysfunction, mild RV dysfunction, moderate left atrial dilatation, severe right atrial dilatation, possible low-flow low gradient severe aortic stenosis.  Lexiscan Myoview on 12/05/2020 showed EF 12%, normal perfusion.  Cardiac MRI on 02/04/2021 showed severe LV dilatation, EF 15%, mid myocardial LGE, RV EF 43%, moderate MR (regurgitant fraction 27%).  LHC/RHC on 03/26/2021 showed nonobstructive CAD (20% proximal RCA, 30% distal LAD, 30% mid LAD), anomalous takeoff of RCA from left main, no gradient across aortic valve, RA 5, RV 49/2, PA 57/16/37, PCWP 30, LVEDP 19, CI 2.2.  Echocardiogram 04/29/2021 showed EF 20 to 25%, mild LV dilatation, grade 2 diastolic dysfunction, mild RV systolic dysfunction, RVSP 68 mmHg, moderate biatrial enlargement, mild AI, no AS.  Since last clinic visit, he reports he is doing well.  States that his dyspnea has improved.  Denies any chest pain, lightheadedness, syncope, lower extreme edema, or palpitations.   Wt Readings from Last 3 Encounters:  05/21/21 201 lb 6.4 oz (91.4 kg)  05/18/21 195 lb (88.5 kg)  05/01/21 201 lb 12.8 oz (91.5  kg)     Past Medical History:  Diagnosis Date   Alcohol abuse    Anomalous right coronary artery    Chronic systolic CHF (congestive heart failure) ([place]) 08/18/2016   Echo 1/17:  Diff HK especially in inf wall, mild LVH, EF 30-35, mild AS (mean 10, peak 18) // Echo 11/15: Mild LVH, EF 30-35, moderate HK, Gr 1 DD, mild AI, mild RAE // Echo 8/18: Mild LVH, EF 30-35, diffuse HK, mild aortic stenosis (mean 7), mild AI, mild BAE   Coronary artery calcification 08/18/2016   Cor CTA 1/08: Ca score 248 // LHC 1/08: oLAD 25%   History of ETOH abuse    Hyperlipidemia 08/27/2013   Hypertension    Lactose intolerance    Non-ischemic cardiomyopathy ([place])    Presumed secondary to Etoh and HTN  (Prev EF 20-25% -- improved to 50%) // LHC 1/08: oLAD 25, EF 25%    OSA (obstructive sleep apnea) 10/17/2013   Severe per home sleep study performed on 09/26/13.    Sleep apnea    SOB (shortness of breath)    Vitamin D deficiency     Past Surgical History:  Procedure Laterality Date   RIGHT/LEFT HEART CATH AND CORONARY ANGIOGRAPHY N/A 03/26/2021   Procedure: RIGHT/LEFT HEART CATH AND CORONARY ANGIOGRAPHY;  Surgeon: Burnell Blanks, MD;  Location: [facility];  Service: Cardiovascular;  Laterality: N/A;    Current Medications: Current Meds  Medication Sig   aspirin EC 81 MG tablet Take 1 tablet (81 mg total) by mouth daily.   atorvastatin (LIPITOR)  10 MG tablet TAKE 1 TABLET BY MOUTH ONCE DAILY AT  6PM (Patient taking differently: Take 10 mg by mouth daily.)   carvedilol (COREG) 6.25 MG tablet TAKE 1 TABLET BY MOUTH TWICE DAILY WITH A MEAL   Coenzyme Q10 100 MG capsule Take 100 mg by mouth daily.   dapagliflozin propanediol (FARXIGA) 10 MG TABS tablet Take 1 tablet (10 mg total) by mouth daily before breakfast.   furosemide (LASIX) 40 MG tablet Take 1 tablet (40 mg total) by mouth daily.   losartan (COZAAR) 25 MG tablet Take 1 tablet (25 mg total) by mouth daily.   melatonin 5 MG TABS Take 5  mg by mouth at bedtime.   polyethylene glycol (MIRALAX / GLYCOLAX) 17 g packet Take 17 g by mouth at bedtime.   spironolactone (ALDACTONE) 25 MG tablet Take 1/2 (one-half) tablet by mouth once daily   Vitamin D, Ergocalciferol, (DRISDOL) 1.25 MG (50000 UNIT) CAPS capsule Take 1 capsule (50,000 Units total) by mouth once a week.   [DISCONTINUED] sildenafil (REVATIO) 20 MG tablet Take 1 tablet (20 mg total) by mouth 3 (three) times daily.     Allergies:   Lisinopril   Social History   Socioeconomic History   Marital status: Married    Spouse name: [name]   Number of children: 3   Years of education: Not on file   Highest education level: Not on file  Occupational History   Occupation: Retired Development worker, community  Tobacco Use   Smoking status: Former    Years: 20.00    Types: Cigarettes    Quit date: 04/12/2002    Years since quitting: 19.1   Smokeless tobacco: Never  Vaping Use   Vaping Use: Never used  Substance and Sexual Activity   Alcohol use: Not Currently    Alcohol/week: 0.0 standard drinks   Drug use: No    Comment: history of marijuana use   Sexual activity: Yes  Other Topics Concern   Not on file  Social History Narrative   2 sons and daughter   1 son in Nature conservation officer. Living with wife 62yrs [name]).  Use to live near [place].   Social Determinants of Health   Financial Resource Strain: Not on file  Food Insecurity: Not on file  Transportation Needs: No Transportation Needs   Lack of Transportation (Medical): No   Lack of Transportation (Non-Medical): No  Physical Activity: Inactive   Days of Exercise per Week: 0 days   Minutes of Exercise per Session: 0 min  Stress: Not on file  Social Connections: Moderately Isolated   Frequency of Communication with Friends and Family: More than three times a week   Frequency of Social Gatherings with Friends and Family: More than three times a week   Attends Religious Services: Never   Marine scientist or Organizations: No   Attends  Music therapist: Never   Marital Status: Married     Family History: The patient's family history includes Alcohol abuse in his father and mother; Heart disease in his maternal grandmother and mother; Heart failure in his father; High Cholesterol in his mother; High blood pressure in his mother; Stroke in his maternal grandmother; Sudden death in his mother. There is no history of Diabetes.  ROS:   Please see the history of present illness.     All other systems reviewed and are negative.  EKGs/Labs/Other Studies Reviewed:    The following studies were reviewed today:   EKG:   04/20/21:NSR, rate 95,  first degree AV block, nonspecific interventricular conduction delay, PVCs 02/24/21: NSR, first degree AV block, rate 74, nonspecific interventricular conduction delay, PVC   Recent Labs: 12/12/2020: TSH 1.570 02/16/2021: ALT 11 02/24/2021: Platelets 244 03/26/2021: Hemoglobin 16.7 04/20/2021: Magnesium 2.2 05/01/2021: BUN 16; Creatinine, Ser 1.22; Potassium 3.9; Sodium 137  Recent Lipid Panel    Component Value Date/Time   CHOL 136 02/16/2021 0903   TRIG 65 02/16/2021 0903   HDL 52 02/16/2021 0903   CHOLHDL 2.6 06/25/2020 0842   CHOLHDL 3 04/16/2019 0738   VLDL 28.0 04/16/2019 0738   LDLCALC 71 02/16/2021 0903    Physical Exam:    VS:  BP 98/62    Pulse 84    Ht 5\' 10"  (1.778 m)    Wt 201 lb 6.4 oz (91.4 kg)    SpO2 96%    BMI 28.90 kg/m     Wt Readings from Last 3 Encounters:  05/21/21 201 lb 6.4 oz (91.4 kg)  05/18/21 195 lb (88.5 kg)  05/01/21 201 lb 12.8 oz (91.5 kg)     GEN:  Well nourished, well developed in no acute distress HEENT: Normal NECK: No JVD CARDIAC: Irregular, normal rate, no murmurs, rubs, gallops RESPIRATORY:  Clear to auscultation without rales, wheezing or rhonchi  ABDOMEN: Soft, non-tender, non-distended MUSCULOSKELETAL:  No edema; No deformity  SKIN: Warm and dry NEUROLOGIC:  Alert and oriented x 3 PSYCHIATRIC:  Normal affect    ASSESSMENT:    1. Chronic combined systolic and diastolic heart failure ([place])   2. Aortic valve stenosis, etiology of cardiac valve disease unspecified   3. Mitral valve insufficiency, unspecified etiology   4. Coronary artery disease involving native coronary artery of native heart without angina pectoris   5. Essential hypertension   6. Pre-op evaluation       PLAN:    Chronic combined systolic and diastolic heart failure: Coronary CTA in 2008 showed anomalous RCA off the left main with scores between pulmonary artery and aorta, calcified plaque in proximal LAD causing less than 50% stenosis, calcium score 248.  Echocardiogram 12/15/2018 showed EF 35 to 40%.  Echocardiogram on 12/05/2020 showed EF less than 123456, grade 2 diastolic dysfunction, mild RV dysfunction, moderate left atrial dilatation, severe right atrial dilatation, possible low-flow low gradient severe aortic stenosis.  Lexiscan Myoview on 12/05/2020 showed EF 12%, normal perfusion.  Cardiac MRI on 02/04/2021 showed severe LV dilatation, EF 15%, mid myocardial LGE, RV EF 43%, moderate MR (regurgitant fraction 27%).  LHC/RHC on 03/26/2021 showed nonobstructive CAD (20% proximal RCA, 30% distal LAD, 30% mid LAD), anomalous takeoff of RCA from left main, no gradient across aortic valve, RA 5, RV 49/2, PA 57/16/37, PCWP 30, LVEDP 19, CI 2.2.  Echocardiogram 04/29/2021 showed EF 20 to 25%, mild LV dilatation, grade 2 diastolic dysfunction, mild RV systolic dysfunction, RVSP 68 mmHg, moderate biatrial enlargement, mild AI, no AS. -History of angioedema with lisinopril, unable to do Entresto.  Continue losartan 25 mg daily -Continue carvedilol 6.25 mg twice daily -Continue spironolactone 12.5 mg daily -Continue Farxiga 10 mg daily -Continue Lasix  40 mg daily.  Check CMET, magnesium -Referred to EP for ICD evaluation, has appointment 06/15/21 -Referred to Advanced Heart Failure  Aortic stenosis: echocardiogram on 12/05/2020 read as low-flow  low gradient severe aortic stenosis, though mean gradient only 7 mmHg.  Aortic valve calcium score was only 327, not consistent with severe AS.  No gradient across valve on cath.  Echo 04/29/2021 showed aortic valve sclerosis but no  stenosis.  Mitral regurgitation: Moderate on MRI (27% regurgitant fraction).  Will monitor with treatment of heart failure  Anomalous RCA off left main with interarterial course: Lexiscan Myoview on 12/05/2020 showed EF 12%, normal perfusion.  CAD: Calcium score 1852 on 01/08/2021 (72nd percentile).  Lexiscan Myoview on 12/05/2020 showed normal perfusion.  Nonobstructive CAD on cath 03/26/2021 (20% proximal RCA, 30% distal LAD, 30% mid LAD), anomalous takeoff of RCA from left main -Continue aspirin 81 mg daily -Continue atorvastatin 10 mg daily  PVCs: Frequent PVCs noted on EKG in clinic, however Zio patch x3 days on 11/27/2020 showed only 2.4% PVC burden  Hypertension: Continue carvedilol 6.25 mg twice daily and spironolactone 12.5 mg daily and losartan 25 mgdaily  Hyperlipidemia: Continue atorvastatin 10 mg daily.  LDL 65 on 06/25/2020  OSA: on CPAP  Liver lesion: MRI abdomen confirmed cyst, no suspicious lesion  Preop evaluation:planning colonoscopy after Cologuard positive stool.  Nonobstructive CAD on cath.  He is at elevated risk given his severe heart failure, however appears optimized.  No further cardiac work-up recommended prior to procedure.  Denies any overt bleeding, will check CBC   RTC in 2 months    Medication Adjustments/Labs and Tests Ordered: Current medicines are reviewed at length with the patient today.  Concerns regarding medicines are outlined above.  Orders Placed This Encounter  Procedures   Comprehensive metabolic panel   CBC   Magnesium   AMB referral to CHF clinic      No orders of the defined types were placed in this encounter.     Patient Instructions  Medication Instructions:  No Changes In Medications at this time.   *If you need a refill on your cardiac medications before your next appointment, please call your pharmacy*  Lab Work: BLOOD WORK TODAY  If you have labs (blood work) drawn today and your tests are completely normal, you will receive your results only by: [place] (if you have MyChart) OR A paper copy in the mail If you have any lab test that is abnormal or we need to change your treatment, we will call you to review the results.  Follow-Up: At [facility], you and your health needs are our priority.  As part of our continuing mission to provide you with exceptional heart care, we have created designated Provider Care Teams.  These Care Teams include your primary Cardiologist (physician) and Advanced Practice Providers (APPs -  Physician Assistants and Nurse Practitioners) who all work together to provide you with the care you need, when you need it.  Your next appointment:   2 month(s)  The format for your next appointment:   In Person  Provider:   Donato Heinz, MD    Other Instructions REFERRAL TO [facility] [place]     Signed, Donato Heinz, MD  05/21/2021 12:54 PM    [place]

## 2021-05-19 MED ORDER — VITAMIN D (ERGOCALCIFEROL) 1.25 MG (50000 UNIT) PO CAPS: 50000.0000 [IU] | ORAL_CAPSULE | ORAL | 0 refills | Status: DC

## 2021-05-19 NOTE — Progress Notes (Signed)
Chief Complaint:   OBESITY [name] is here to discuss his progress with his obesity treatment plan along with follow-up of his obesity related diagnoses. [name] is on keeping a food journal and adhering to recommended goals of 1200-1600 calories and 85+ grams of protein daily and states he is following his eating plan approximately 20% of the time. [name] states he is active some while doing yard work.   Today's visit was #: 24 Starting weight: 223 lbs Starting date: 08/15/2019 Today's weight: 195 lbs Today's date: 05/18/2021 Total lbs lost to date: 28 Total lbs lost since last in-office visit: 0  Interim History: [name] has done well with maintaining his weight. He has had some medications changes for his heart and he is feeling better. He is ready to start walking again for exercise.  Subjective:   1. Vitamin D deficiency [name] is stable and he denies nausea, vomiting, or muscle weakness. He requests a refill.  2. Chronic systolic CHF (congestive heart failure) ([name]) [name] had his medications changed recently, and he notes he feels better. His breathing has improved. He is ready to start exercising.  Assessment/Plan:   1. Vitamin D deficiency Low Vitamin D level contributes to fatigue and are associated with obesity, breast, and colon cancer. We will refill prescription Vitamin D for 1 month. [name] will follow-up for routine testing of Vitamin D, at least 2-3 times per year to avoid over-replacement.  - Vitamin D, Ergocalciferol, (DRISDOL) 1.25 MG (50000 UNIT) CAPS capsule; Take 1 capsule (50,000 Units total) by mouth once a week.  Dispense: 4 capsule; Refill: 0  2. Chronic systolic CHF (congestive heart failure) (HCC) [name] will continue with diet and work on increasing walking. He will continue to follow up.  3. Obesity with current BMI 28.1 [name] is currently in the action stage of change. As such, his goal is to continue with weight loss efforts. He has agreed to keeping a food journal  and adhering to recommended goals of 1200-1600 calories and 85+ grams of protein daily.   Exercise goals: All adults should avoid inactivity. Some physical activity is better than none, and adults who participate in any amount of physical activity gain some health benefits.  Behavioral modification strategies: increasing lean protein intake and meal planning and cooking strategies.  [name] has agreed to follow-up with our clinic in 4 weeks. He was informed of the importance of frequent follow-up visits to maximize his success with intensive lifestyle modifications for his multiple health conditions.   Objective:   Blood pressure 93/67, pulse 71, temperature 98.2 F (36.8 C), height 5\' 10"  (1.778 m), weight 195 lb (88.5 kg), SpO2 95 %. Body mass index is 27.98 kg/m.  General: Cooperative, alert, well developed, in no acute distress. HEENT: Conjunctivae and lids unremarkable. Cardiovascular: Regular rhythm.  Lungs: Normal work of breathing. Neurologic: No focal deficits.   Lab Results  Component Value Date   CREATININE 1.22 05/01/2021   BUN 16 05/01/2021   NA 137 05/01/2021   K 3.9 05/01/2021   CL 100 05/01/2021   CO2 29 05/01/2021   Lab Results  Component Value Date   ALT 11 02/16/2021   AST 18 02/16/2021   ALKPHOS 71 02/16/2021   BILITOT 0.9 02/16/2021   Lab Results  Component Value Date   HGBA1C 5.8 (H) 02/16/2021   HGBA1C 5.7 (H) 06/25/2020   HGBA1C 5.5 02/12/2020   HGBA1C 5.9 (H) 11/13/2019   HGBA1C 6.1 (H) 08/15/2019   Lab Results  Component  Value Date   INSULIN 14.9 02/16/2021   INSULIN 6.3 06/25/2020   INSULIN 7.6 02/12/2020   INSULIN 10.2 11/13/2019   INSULIN 10.0 08/15/2019   Lab Results  Component Value Date   TSH 1.570 12/12/2020   Lab Results  Component Value Date   CHOL 136 02/16/2021   HDL 52 02/16/2021   LDLCALC 71 02/16/2021   TRIG 65 02/16/2021   CHOLHDL 2.6 06/25/2020   Lab Results  Component Value Date   VD25OH 58.8 02/16/2021    VD25OH 53.4 06/25/2020   VD25OH 63.0 02/12/2020   Lab Results  Component Value Date   WBC 4.9 02/24/2021   HGB 16.7 03/26/2021   HCT 49.0 03/26/2021   MCV 81 02/24/2021   PLT 244 02/24/2021   No results found for: IRON, TIBC, FERRITIN  Obesity Behavioral Intervention:   Approximately 15 minutes were spent on the discussion below.  ASK: We discussed the diagnosis of obesity with [name] today and [name] agreed to give [place] permission to discuss obesity behavioral modification therapy today.  ASSESS: [name] has the diagnosis of obesity and his BMI today is 28.1. [name] is in the action stage of change.   ADVISE: [name] was educated on the multiple health risks of obesity as well as the benefit of weight loss to improve his health. He was advised of the need for long term treatment and the importance of lifestyle modifications to improve his current health and to decrease his risk of future health problems.  AGREE: Multiple dietary modification options and treatment options were discussed and [name] agreed to follow the recommendations documented in the above note.  ARRANGE: [name] was educated on the importance of frequent visits to treat obesity as outlined per CMS and USPSTF guidelines and agreed to schedule his next follow up appointment today.  Attestation Statements:   Reviewed by clinician on day of visit: allergies, medications, problem list, medical history, surgical history, family history, social history, and previous encounter notes.   I, Trixie Dredge, am acting as transcriptionist for Dennard Nip, MD.  I have reviewed the above documentation for accuracy and completeness, and I agree with the above. -  Dennard Nip, MD

## 2021-05-21 ENCOUNTER — Encounter: Payer: Self-pay | Admitting: Cardiology

## 2021-05-21 ENCOUNTER — Ambulatory Visit (INDEPENDENT_AMBULATORY_CARE_PROVIDER_SITE_OTHER): Payer: Medicare Other | Admitting: Cardiology

## 2021-05-21 ENCOUNTER — Other Ambulatory Visit: Payer: Self-pay

## 2021-05-21 VITALS — BP 98/62 | HR 84 | Ht 70.0 in | Wt 201.4 lb

## 2021-05-21 DIAGNOSIS — I340 Nonrheumatic mitral (valve) insufficiency: Secondary | ICD-10-CM | POA: Diagnosis not present

## 2021-05-21 DIAGNOSIS — Z01810 Encounter for preprocedural cardiovascular examination: Secondary | ICD-10-CM | POA: Diagnosis not present

## 2021-05-21 DIAGNOSIS — I10 Essential (primary) hypertension: Secondary | ICD-10-CM

## 2021-05-21 DIAGNOSIS — I5042 Chronic combined systolic (congestive) and diastolic (congestive) heart failure: Secondary | ICD-10-CM | POA: Diagnosis not present

## 2021-05-21 DIAGNOSIS — I2510 Atherosclerotic heart disease of native coronary artery without angina pectoris: Secondary | ICD-10-CM

## 2021-05-21 DIAGNOSIS — I350 Nonrheumatic aortic (valve) stenosis: Secondary | ICD-10-CM

## 2021-05-21 DIAGNOSIS — Z01818 Encounter for other preprocedural examination: Secondary | ICD-10-CM

## 2021-05-21 LAB — CBC
Hematocrit: 49.7 % (ref 37.5–51.0)
Hemoglobin: 17 g/dL (ref 13.0–17.7)
MCH: 28.1 pg (ref 26.6–33.0)
MCHC: 34.2 g/dL (ref 31.5–35.7)
MCV: 82 fL (ref 79–97)
Platelets: 148 10*3/uL — ABNORMAL LOW (ref 150–450)
RBC: 6.06 x10E6/uL — ABNORMAL HIGH (ref 4.14–5.80)
RDW: 14.6 % (ref 11.6–15.4)
WBC: 3.1 10*3/uL — ABNORMAL LOW (ref 3.4–10.8)

## 2021-05-21 LAB — COMPREHENSIVE METABOLIC PANEL
ALT: 13 IU/L (ref 0–44)
AST: 18 IU/L (ref 0–40)
Albumin/Globulin Ratio: 1.3 (ref 1.2–2.2)
Albumin: 4.2 g/dL (ref 3.7–4.7)
Alkaline Phosphatase: 61 IU/L (ref 44–121)
BUN/Creatinine Ratio: 14 (ref 10–24)
BUN: 17 mg/dL (ref 8–27)
Bilirubin Total: 0.6 mg/dL (ref 0.0–1.2)
CO2: 25 mmol/L (ref 20–29)
Calcium: 9.6 mg/dL (ref 8.6–10.2)
Chloride: 99 mmol/L (ref 96–106)
Creatinine, Ser: 1.24 mg/dL (ref 0.76–1.27)
Globulin, Total: 3.3 g/dL (ref 1.5–4.5)
Glucose: 95 mg/dL (ref 70–99)
Potassium: 4.3 mmol/L (ref 3.5–5.2)
Sodium: 136 mmol/L (ref 134–144)
Total Protein: 7.5 g/dL (ref 6.0–8.5)
eGFR: 61 mL/min/{1.73_m2} (ref >59)

## 2021-05-21 LAB — MAGNESIUM: Magnesium: 2.1 mg/dL (ref 1.6–2.3)

## 2021-05-21 NOTE — Patient Instructions (Signed)
Medication Instructions:  No Changes In Medications at this time.  *If you need a refill on your cardiac medications before your next appointment, please call your pharmacy*  Lab Work: BLOOD WORK TODAY  If you have labs (blood work) drawn today and your tests are completely normal, you will receive your results only by: MyChart Message (if you have MyChart) OR A paper copy in the mail If you have any lab test that is abnormal or we need to change your treatment, we will call you to review the results.  Follow-Up: At [facility], you and your health needs are our priority.  As part of our continuing mission to provide you with exceptional heart care, we have created designated Provider Care Teams.  These Care Teams include your primary Cardiologist (physician) and Advanced Practice Providers (APPs -  Physician Assistants and Nurse Practitioners) who all work together to provide you with the care you need, when you need it.  Your next appointment:   2 month(s)  The format for your next appointment:   In Person  Provider:   Little Ishikawa, MD    Other Instructions REFERRAL TO [facility]

## 2021-05-22 ENCOUNTER — Telehealth (HOSPITAL_COMMUNITY): Payer: Self-pay | Admitting: Vascular Surgery

## 2021-05-22 NOTE — Telephone Encounter (Signed)
Left pt message giving NEW PT appt w/ Mclean , asked pt to call back to confirm appt

## 2021-05-25 ENCOUNTER — Other Ambulatory Visit: Payer: Self-pay | Admitting: Family

## 2021-05-26 ENCOUNTER — Encounter: Payer: Self-pay | Admitting: *Deleted

## 2021-06-15 ENCOUNTER — Other Ambulatory Visit: Payer: Self-pay

## 2021-06-15 ENCOUNTER — Ambulatory Visit (INDEPENDENT_AMBULATORY_CARE_PROVIDER_SITE_OTHER): Payer: Medicare Other | Admitting: Internal Medicine

## 2021-06-15 ENCOUNTER — Encounter: Payer: Self-pay | Admitting: Internal Medicine

## 2021-06-15 VITALS — BP 124/66 | HR 71 | Ht 70.0 in | Wt 205.0 lb

## 2021-06-15 DIAGNOSIS — I5022 Chronic systolic (congestive) heart failure: Secondary | ICD-10-CM

## 2021-06-15 DIAGNOSIS — I428 Other cardiomyopathies: Secondary | ICD-10-CM | POA: Diagnosis not present

## 2021-06-15 NOTE — Patient Instructions (Signed)

## 2021-06-15 NOTE — Progress Notes (Signed)
ELECTROPHYSIOLOGY CONSULT NOTE  Patient ID: [name], MRN: [id number], DOB/AGE: 06/18/1945 76 y.o. Admit date: (Not on file) Date of Consult: 06/15/2021  Primary Physician: Debbrah Alar, NP Primary Cardiologist: CSch     [name] is a 76 y.o. male who is being seen today for the evaluation of ICD at the request of Dr. Nechama Guard.    HPI [name] is a 76 y.o. male referred for an ICD for primary prevention in the setting of a nonischemic cardiomyopathy.  Initially presented with congestive heart failure 2008  Evaluation demonstrated an ejection fraction that was depressed, and thought at least in part secondary to alcohol.  CTA showed an anomalous RCA but only modest plaquing.  Over the last 4 to 6 months he has been followed by Dr. Gardiner Rhyme.  He was started on losartan with a history of angioedema on lisinopril.  Has been treated with carvedilol and spironolactone and has done much better.  He has modest shortness of breath with exerting. No syncope.  No palpitations.  Scant edema.  Has been noted to have PVCs  He works in the yard.  Has a history of hypertension. DATE TEST EF   11/15 Echo  30-35%   8/18 Echo  30-35%   9/20 Echo   35-40 %   8/22 Echo  20%   10/22 cMRI 15% LGE MR mod  12/22 LHC  Anomalous RCA O/w nonbstructive CAD  1/23 Echo  25-30% RVSAP 68   Date Cr K Hgb  11/21 1.23 4.0 15.9  2/23  4.3 17     PVC monitor 2.4 %  Past Medical History:  Diagnosis Date   Alcohol abuse    Anomalous right coronary artery    Chronic systolic CHF (congestive heart failure) ([place]) 08/18/2016   Echo 1/17:  Diff HK especially in inf wall, mild LVH, EF 30-35, mild AS (mean 10, peak 18) // Echo 11/15: Mild LVH, EF 30-35, moderate HK, Gr 1 DD, mild AI, mild RAE // Echo 8/18: Mild LVH, EF 30-35, diffuse HK, mild aortic stenosis (mean 7), mild AI, mild BAE   Coronary artery calcification 08/18/2016   Cor CTA 1/08: Ca score 248 // LHC 1/08: oLAD 25%   History  of ETOH abuse    Hyperlipidemia 08/27/2013   Hypertension    Lactose intolerance    Non-ischemic cardiomyopathy ([place])    Presumed secondary to Etoh and HTN  (Prev EF 20-25% -- improved to 50%) // LHC 1/08: oLAD 25, EF 25%    OSA (obstructive sleep apnea) 10/17/2013   Severe per home sleep study performed on 09/26/13.    Sleep apnea    SOB (shortness of breath)    Vitamin D deficiency       Surgical History:  Past Surgical History:  Procedure Laterality Date   RIGHT/LEFT HEART CATH AND CORONARY ANGIOGRAPHY N/A 03/26/2021   Procedure: RIGHT/LEFT HEART CATH AND CORONARY ANGIOGRAPHY;  Surgeon: Burnell Blanks, MD;  Location: [facility];  Service: Cardiovascular;  Laterality: N/A;     Home Meds: Current Meds  Medication Sig   aspirin EC 81 MG tablet Take 1 tablet (81 mg total) by mouth daily.   atorvastatin (LIPITOR) 10 MG tablet Take 1 tablet (10 mg total) by mouth daily.   carvedilol (COREG) 6.25 MG tablet TAKE 1 TABLET BY MOUTH TWICE DAILY WITH A MEAL   Coenzyme Q10 100 MG capsule Take 100 mg by mouth daily.   dapagliflozin propanediol (  FARXIGA) 10 MG TABS tablet Take 1 tablet (10 mg total) by mouth daily before breakfast.   furosemide (LASIX) 40 MG tablet Take 1 tablet (40 mg total) by mouth daily.   melatonin 5 MG TABS Take 5 mg by mouth at bedtime.   polyethylene glycol (MIRALAX / GLYCOLAX) 17 g packet Take 17 g by mouth at bedtime.   spironolactone (ALDACTONE) 25 MG tablet Take 1/2 (one-half) tablet by mouth once daily   Vitamin D, Ergocalciferol, (DRISDOL) 1.25 MG (50000 UNIT) CAPS capsule Take 1 capsule (50,000 Units total) by mouth once a week.    Allergies:  Allergies  Allergen Reactions   Lisinopril Swelling    ANGIOEDEMA    Social History   Socioeconomic History   Marital status: Married    Spouse name: Constance Holster   Number of children: 3   Years of education: Not on file   Highest education level: Not on file  Occupational History   Occupation:  Retired Development worker, community  Tobacco Use   Smoking status: Former    Years: 20.00    Types: Cigarettes    Quit date: 04/12/2002    Years since quitting: 19.1   Smokeless tobacco: Never  Vaping Use   Vaping Use: Never used  Substance and Sexual Activity   Alcohol use: Not Currently    Alcohol/week: 0.0 standard drinks   Drug use: No    Comment: history of marijuana use   Sexual activity: Yes  Other Topics Concern   Not on file  Social History Narrative   2 sons and daughter   1 son in Nature conservation officer. Living with wife 61yrs Constance Holster).  Use to live near [place].   Social Determinants of Health   Financial Resource Strain: Not on file  Food Insecurity: Not on file  Transportation Needs: No Transportation Needs   Lack of Transportation (Medical): No   Lack of Transportation (Non-Medical): No  Physical Activity: Inactive   Days of Exercise per Week: 0 days   Minutes of Exercise per Session: 0 min  Stress: Not on file  Social Connections: Moderately Isolated   Frequency of Communication with Friends and Family: More than three times a week   Frequency of Social Gatherings with Friends and Family: More than three times a week   Attends Religious Services: Never   Marine scientist or Organizations: No   Attends Music therapist: Never   Marital Status: Married  Human resources officer Violence: Not At Risk   Fear of Current or Ex-Partner: No   Emotionally Abused: No   Physically Abused: No   Sexually Abused: No     Family History  Problem Relation Age of Onset   Alcohol abuse Mother    Heart disease Mother    High blood pressure Mother    High Cholesterol Mother    Sudden death Mother    Alcohol abuse Father    Heart failure Father    Heart disease Maternal Grandmother    Stroke Maternal Grandmother    Diabetes Neg Hx      ROS:  Please see the history of present illness.     All other systems reviewed and negative.    Physical Exam:  Blood pressure 124/66, pulse 71, height  5\' 10"  (1.778 m), weight 205 lb (93 kg), SpO2 96 %. General: Well developed, well nourished male in no acute distress. Head: Normocephalic, atraumatic, sclera non-icteric, no xanthomas, nares are without discharge. EENT: normal  Lymph Nodes:  none Neck: Negative for carotid  bruits. JVD not elevated. Back:without scoliosis kyphosis Lungs: Clear bilaterally to auscultation without wheezes, rales, or rhonchi. Breathing is unlabored. Heart: RRR with S1 S2. No murmur . No rubs, or gallops appreciated. Abdomen: Soft, non-tender, non-distended with normoactive bowel sounds. No hepatomegaly. No rebound/guarding. No obvious abdominal masses. Msk:  Strength and tone appear normal for age. Extremities: No clubbing or cyanosis. No  edema.  Distal pedal pulses are 2+ and equal bilaterally. Skin: Warm and Dry Neuro: Alert and oriented X 3. CN III-XII intact Grossly normal sensory and motor function . Psych:  Responds to questions appropriately with a normal affect.        EKG: Sinus at 71 Intervals 26/13/45 IVCD   Assessment and Plan:  Nonischemic cardiomyopathy  First-degree AV block/IVCD  Congestive heart failure-class IIb-IIIa  Angioedema on lisinopril   The patient has persistent left ventricular dysfunction over many years originally attributed to alcohol but clearly not in the absence of recovery.  He is referred for consideration of an ICD with his nonischemic cardiomyopathy.  He has class IIb-III symptoms and is well medicated on spironolactone 12.5, carvedilol 6.25 twice daily, Farxiga 10 and losartan 25.  He is not on Entresto because of his angioedema to lisinopril.  We reviewed the Hardy, the original trial from 2016 and the 8-year follow-up in 2022.  The latter has supported the lack of benefit in patients over the age of 20, with good with no mortality curves.  As there is no indication in him for CRT, it is not clear that there is a benefit for him for an ICD; however, it  remains a guideline supported recommendation and we discussed it as such.  He will reflect on and let us know what it is that he wants to do.   85 min was spent in care of the patient including the review of records     Virl Axe

## 2021-06-16 ENCOUNTER — Ambulatory Visit (INDEPENDENT_AMBULATORY_CARE_PROVIDER_SITE_OTHER): Payer: Medicare Other | Admitting: Family Medicine

## 2021-06-19 ENCOUNTER — Other Ambulatory Visit (INDEPENDENT_AMBULATORY_CARE_PROVIDER_SITE_OTHER): Payer: Self-pay | Admitting: Family Medicine

## 2021-06-19 ENCOUNTER — Ambulatory Visit (HOSPITAL_COMMUNITY)
Admission: RE | Admit: 2021-06-19 | Discharge: 2021-06-19 | Disposition: A | Discharge status: Home / Self Care | Payer: Medicare Other | Source: Ambulatory Visit | Attending: Cardiology | Admitting: Cardiology

## 2021-06-19 ENCOUNTER — Encounter (HOSPITAL_COMMUNITY): Payer: Self-pay | Admitting: Cardiology

## 2021-06-19 ENCOUNTER — Other Ambulatory Visit: Payer: Self-pay

## 2021-06-19 VITALS — BP 112/70 | HR 75 | Wt 202.2 lb

## 2021-06-19 DIAGNOSIS — Q245 Malformation of coronary vessels: Secondary | ICD-10-CM | POA: Diagnosis not present

## 2021-06-19 DIAGNOSIS — Z9989 Dependence on other enabling machines and devices: Secondary | ICD-10-CM | POA: Diagnosis not present

## 2021-06-19 DIAGNOSIS — Z8249 Family history of ischemic heart disease and other diseases of the circulatory system: Secondary | ICD-10-CM | POA: Insufficient documentation

## 2021-06-19 DIAGNOSIS — R0602 Shortness of breath: Secondary | ICD-10-CM | POA: Insufficient documentation

## 2021-06-19 DIAGNOSIS — I428 Other cardiomyopathies: Secondary | ICD-10-CM | POA: Insufficient documentation

## 2021-06-19 DIAGNOSIS — E559 Vitamin D deficiency, unspecified: Secondary | ICD-10-CM

## 2021-06-19 DIAGNOSIS — I2510 Atherosclerotic heart disease of native coronary artery without angina pectoris: Secondary | ICD-10-CM | POA: Diagnosis not present

## 2021-06-19 DIAGNOSIS — Z7901 Long term (current) use of anticoagulants: Secondary | ICD-10-CM | POA: Diagnosis not present

## 2021-06-19 DIAGNOSIS — I5022 Chronic systolic (congestive) heart failure: Secondary | ICD-10-CM | POA: Diagnosis not present

## 2021-06-19 DIAGNOSIS — Z79899 Other long term (current) drug therapy: Secondary | ICD-10-CM | POA: Insufficient documentation

## 2021-06-19 DIAGNOSIS — G4733 Obstructive sleep apnea (adult) (pediatric): Secondary | ICD-10-CM | POA: Diagnosis not present

## 2021-06-19 DIAGNOSIS — I110 Hypertensive heart disease with heart failure: Secondary | ICD-10-CM | POA: Diagnosis not present

## 2021-06-19 LAB — CBC
HCT: 50 % (ref 39.0–52.0)
Hemoglobin: 17 g/dL (ref 13.0–17.0)
MCH: 28 pg (ref 26.0–34.0)
MCHC: 34 g/dL (ref 30.0–36.0)
MCV: 82.4 fL (ref 80.0–100.0)
Platelets: 158 10*3/uL (ref 150–400)
RBC: 6.07 MIL/uL — ABNORMAL HIGH (ref 4.22–5.81)
RDW: 14.4 % (ref 11.5–15.5)
WBC: 4.4 10*3/uL (ref 4.0–10.5)
nRBC: 0 % (ref 0.0–0.2)

## 2021-06-19 LAB — BASIC METABOLIC PANEL
Anion gap: 10 (ref 5–15)
BUN: 19 mg/dL (ref 8–23)
CO2: 28 mmol/L (ref 22–32)
Calcium: 9.3 mg/dL (ref 8.9–10.3)
Chloride: 100 mmol/L (ref 98–111)
Creatinine, Ser: 1.22 mg/dL (ref 0.61–1.24)
GFR, Estimated: >60 mL/min (ref >60)
Glucose, Bld: 79 mg/dL (ref 70–99)
Potassium: 4.5 mmol/L (ref 3.5–5.1)
Sodium: 138 mmol/L (ref 135–145)

## 2021-06-19 LAB — BRAIN NATRIURETIC PEPTIDE: B Natriuretic Peptide: 333.4 pg/mL — ABNORMAL HIGH (ref 0.0–100.0)

## 2021-06-19 MED ORDER — SPIRONOLACTONE 25 MG PO TABS: 25.0000 mg | ORAL_TABLET | Freq: Every day | ORAL | 11 refills | Status: AC

## 2021-06-19 MED ORDER — CARVEDILOL 6.25 MG PO TABS: 9.7500 mg | ORAL_TABLET | Freq: Two times a day (BID) | ORAL | 6 refills | Status: DC

## 2021-06-19 MED ORDER — SILDENAFIL CITRATE 50 MG PO TABS: 50.0000 mg | ORAL_TABLET | ORAL | 4 refills | Status: AC | PRN

## 2021-06-19 NOTE — Progress Notes (Signed)
Blood collected for TTR genetic testing per Dr McLean.  Order form completed and both shipped by Fed[employer] to [facility].  

## 2021-06-19 NOTE — Patient Instructions (Signed)
Medication Changes: ? ?Increase Carvedilol to 9.375 mg (1 1/2 Tab) Twice daily ? ?Increase Spironolactone to 25 mg Daily ? ?Take Viagra as needed ? ?Lab Work: ? ?Labs done today, your results will be available in MyChart, we will contact you for abnormal readings. ? ?Genetic test has been done, this has to be sent to [place] to be processed and can take 1-2 weeks to get results back.  We will let you know the results. ? ? ?Testing/Procedures: ? ?Repeat blood work in 10 days ? ?Referrals: ? ?Please follow up with our heart failure pharmacist in 3 weeks  ? ? ?Special Instructions // Education: ? ?none ? ?Follow-Up in: 2 months ? ?At the [facility], you and your health needs are our priority. We have a designated team specialized in the treatment of Heart Failure. This Care Team includes your primary Heart Failure Specialized Cardiologist (physician), Advanced Practice Providers (APPs- Physician Assistants and Nurse Practitioners), and Pharmacist who all work together to provide you with the care you need, when you need it.  ? ?You may see any of the following providers on your designated Care Team at your next follow up: ? ?Dr Arvilla Meres ?Dr Marca Ancona ?Tonye Becket, NP ?Robbie Lis, PA ?Jessica Milford,NP ?Anna Genre, PA ?Karle Plumber, PharmD ? ? ?Please be sure to bring in all your medications bottles to every appointment.  ? ?Need to Contact us: ? ?If you have any questions or concerns before your next appointment please send [place] a message through [place] or call our office at [phone].   ? ?TO LEAVE A MESSAGE FOR THE NURSE SELECT OPTION 2, PLEASE LEAVE A MESSAGE INCLUDING: ?YOUR NAME ?DATE OF BIRTH ?CALL BACK NUMBER ?REASON FOR CALL**this is important as we prioritize the call backs ? ?YOU WILL RECEIVE A CALL BACK THE SAME DAY AS LONG AS YOU CALL BEFORE 4:00 PM ? ? ?

## 2021-06-21 NOTE — Progress Notes (Signed)
PCP: Debbrah Alar, NP ?Cardiology: Dr. Gardiner Rhyme ?HF Cardiology: Dr. Aundra Dubin  ? ?76 y.o. with history of chronic systolic CHF and anomalous RCA was referred by Dr. Gardiner Rhyme for evaluation of CHF.  Patient had heart catheterization in 12/22 with anomalous RCA but only mild nonobstructive CAD.  Most recent echo in 1/23 showed EF 20-25%, mild RV dysfunction. Regarding the anomalous RCA, Cardiolite in 8/22 showed no ischemia.  Cardiac MRI in 10/22 showed mid-myocardial LGE in the anterolateral and inferoseptal walls.  Of note, patient's mother had CHF and died of SCD at 68, his sister also has CHF and sounds like she had a possible SCD event.  Patient was a heavy drinker prior to 2008, and drank moderately until 2022 (has now quit).  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  ? ?He is currently doing fairly well.  No dyspnea walking on flat ground. Can walk up a flight of stairs.  Generally only gets short of breath with heavy exertion.  No chest pain.  No lightheadedness.  He has been going to the gym some for exercise.  ? ?Labs (11/22): LDL 71 ?Labs (2/23): K 4.3, creatinine 1.24, hgb 17 ? ?ECG (personally reviewed): NSR, 1st degree AVB, IVCD 132 msec ? ?PMH: ?1. HTN ?2. Hyperlipidemia ?3 obstructive sleep apnea: CPAP ?4. ETOH abuse: remote ?5. Hyperlipidemia ?6. Anomalous RCA: Coronary CTA 2008 showed anomalous RCA off LM passing between PA and aorta, <50% pLAD stenosis.  ?- Cardiolite (8/22): EF 12%, no ischemia.  ?7. Chronic systolic CHF: Nonischemic cardiomyopathy. Echo (9/20) with EF 35-40%.  ?- Echo (8/22): EF < 20%, mild RV dysfunction, ?low flow/low gradient severe AS.  ?- cMRI (10/22): EF 15%, anterolateral and inferoseptal mid-wall LGE, RVEF 43%, moderate MR.  ?- LHC/RHC (12/22): Anomalous RCA from LM with nonbstructive CAD; mean RA 5, PA 57/16, mean PCWP 30, CI 2.2. ?- Echo (1/23): EF 20-25%, mildly decreased RV systolic function, no AS.  ?- Angioedema with lisinopril ?8. PVCs: Zio monitor (8/22)  with 2.4% PVCs ?9. Hyperlipidemia ? ?FH: Mother with CHF and SCD, died at age 59, sister with CHF and sounds like she had a cardiac arrest.  ? ?SH: Married, 3 kids, retired Advertising copywriter.  Nonsmoker.  Was a heavy drinker at one point but quit heavy ETOH in 2008 and quit completely in 2022. Lives in [place] area.  ? ?ROS: All systems reviewed and negative except as per HPI.  ? ?Current Outpatient Medications  ?Medication Sig Dispense Refill  ? aspirin EC 81 MG tablet Take 1 tablet (81 mg total) by mouth daily. 90 tablet 3  ? atorvastatin (LIPITOR) 10 MG tablet Take 1 tablet (10 mg total) by mouth daily. 90 tablet 1  ? Coenzyme Q10 100 MG capsule Take 100 mg by mouth daily.    ? dapagliflozin propanediol (FARXIGA) 10 MG TABS tablet Take 1 tablet (10 mg total) by mouth daily before breakfast. 90 tablet 3  ? furosemide (LASIX) 40 MG tablet Take 1 tablet (40 mg total) by mouth daily. 90 tablet 3  ? losartan (COZAAR) 25 MG tablet Take 1 tablet (25 mg total) by mouth daily. 90 tablet 3  ? melatonin 5 MG TABS Take 5 mg by mouth at bedtime.    ? polyethylene glycol (MIRALAX / GLYCOLAX) 17 g packet Take 17 g by mouth at bedtime.    ? sildenafil (VIAGRA) 50 MG tablet Take 1 tablet (50 mg total) by mouth as needed for erectile dysfunction. 15 tablet 4  ?  Vitamin D, Ergocalciferol, (DRISDOL) 1.25 MG (50000 UNIT) CAPS capsule Take 1 capsule (50,000 Units total) by mouth once a week. 4 capsule 0  ? carvedilol (COREG) 6.25 MG tablet Take 1.5 tablets (9.375 mg total) by mouth 2 (two) times daily with a meal. 60 tablet 6  ? spironolactone (ALDACTONE) 25 MG tablet Take 1 tablet (25 mg total) by mouth daily. 30 tablet 11  ? ?No current facility-administered medications for this encounter.  ? ?BP 112/70   Pulse 75   Wt 91.7 kg (202 lb 3.2 oz)   SpO2 97%   BMI 29.01 kg/m?  ?General: NAD ?Neck: No JVD, no thyromegaly or thyroid nodule.  ?Lungs: Clear to auscultation bilaterally with normal respiratory effort. ?CV:  Nondisplaced PMI.  Heart regular S1/S2, no S3/S4, no murmur.  No peripheral edema.  No carotid bruit.  Normal pedal pulses.  ?Abdomen: Soft, nontender, no hepatosplenomegaly, no distention.  ?Skin: Intact without lesions or rashes.  ?Neurologic: Alert and oriented x 3.  ?Psych: Normal affect. ?Extremities: No clubbing or cyanosis.  ?HEENT: Normal.  ? ?Assessment/Plan: ?1. Chronic systolic CHF: Nonischemic cardiomyopathy.  Cardiac MRI with mid-myocardial LGE in the anterolateral and inferoseptal walls.  Most recent echo in 1/23 with EF 20-25%, mildly decreased RV systolic function.  Nonobstructive mild CAD on 12/22 cath.  Cause of cardiomyopathy is uncertain.  Possible familial cardiomyopathy given mother and sister's histories.  ETOH could also play a role, though he stopped heavy ETOH in 2008 per his report (stopped completely in 2022).  Viral myocarditis is also a consideration given the LGE pattern. No significant LVH, amyloidosis unlikely.  NYHA class II symptoms, not volume overloaded on exam.  ?- Unable to take Entresto or ACEIs due to angioedema.  Tolerating losartan 25 mg daily. ?- Increase spironolactone to 25 mg daily. BMET today and in 10 days.  ?- Increase Coreg to 9.375 mg bid.  ?- Continue dapagliflozin 10 mg daily.  ?- Patient has spoken with Dr. Caryl Comes about ICD, he is leaning towards getting one.  I think this would be a good idea; though he has a nonischemic cardiomyopathy, he has significant LGE on cMRI and a family history of SCD.  IVCD but QRS onlyh 132 msec so unlikely to be good candidate for CRT.  ?2. Anomalous RCA: Patient has an anomalous RCA from left main passing between aorta and PA.  Cardiolite in 8/22 showed no ischemia and he has had no chest pain. I think this should be managed conservatively (no surgery).  ?3. OSA: Use CPAP.  ? ?Followup in 3 wks with HF pharmacist for med titration x 2 visits, see me in 2 months.  ? ?[name] ?06/21/2021 ? ?

## 2021-06-23 ENCOUNTER — Encounter: Payer: Self-pay | Admitting: Gastroenterology

## 2021-06-23 ENCOUNTER — Ambulatory Visit (INDEPENDENT_AMBULATORY_CARE_PROVIDER_SITE_OTHER): Payer: Medicare Other | Admitting: Gastroenterology

## 2021-06-23 DIAGNOSIS — I2510 Atherosclerotic heart disease of native coronary artery without angina pectoris: Secondary | ICD-10-CM | POA: Diagnosis not present

## 2021-06-23 DIAGNOSIS — R195 Other fecal abnormalities: Secondary | ICD-10-CM

## 2021-06-23 DIAGNOSIS — I5022 Chronic systolic (congestive) heart failure: Secondary | ICD-10-CM

## 2021-06-23 NOTE — Progress Notes (Signed)
Review of pertinent gastrointestinal problems: ?1.  Increased risk for colon cancer.  Colonoscopy July 2008 was normal except for diverticulosis.  Cologuard positive colon cancer screening test September 2022 ? ?HPI: ?This is a very pleasant 76 year old man whom I last saw about 3 months ago. ? ? ?I last saw him here in the office December 2022 at which time we discussed his Cologuard colon cancer screening test that had been ordered by his primary care physician.  The test was positive.  He has significant cardiac disease with a very low cardiac ejection fraction and I wanted formal cardiology clearance before committing to any type of invasive testing such as a colonoscopy. ? ?Echocardiogram 04/2021 showed ejection fraction 20 to 25% ? ?CBC last week was essentially normal, BNP last week was 333 ? ?His weight is down about 5 pounds since his last office visit here 3 months ago. ? ?He really does amazingly well even with an ejection fraction of around 20 to 25%.  He climbs stairs without shortness of breath or chest pains.  He has no problems with edema.   ? ?He has no GI symptoms, specifically no constipation diarrhea or bleeding.  No specific significant abdominal pains ? ? ?ROS: complete GI ROS as described in HPI, all other review negative. ? ?Constitutional:  No unintentional weight loss ? ? ?Past Medical History:  ?Diagnosis Date  ? Alcohol abuse   ? Anomalous right coronary artery   ? Chronic systolic CHF (congestive heart failure) (HCC) 08/18/2016  ? Echo 1/17:  Diff HK especially in inf wall, mild LVH, EF 30-35, mild AS (mean 10, peak 18) // Echo 11/15: Mild LVH, EF 30-35, moderate HK, Gr 1 DD, mild AI, mild RAE // Echo 8/18: Mild LVH, EF 30-35, diffuse HK, mild aortic stenosis (mean 7), mild AI, mild BAE  ? Coronary artery calcification 08/18/2016  ? Cor CTA 1/08: Ca score 248 // LHC 1/08: oLAD 25%  ? History of ETOH abuse   ? Hyperlipidemia 08/27/2013  ? Hypertension   ? Lactose intolerance   ? Non-ischemic  cardiomyopathy (HCC)   ? Presumed secondary to Etoh and HTN  (Prev EF 20-25% -- improved to 50%) // LHC 1/08: oLAD 25, EF 25%   ? OSA (obstructive sleep apnea) 10/17/2013  ? Severe per home sleep study performed on 09/26/13.   ? Sleep apnea   ? SOB (shortness of breath)   ? Vitamin D deficiency   ? ? ?Past Surgical History:  ?Procedure Laterality Date  ? RIGHT/LEFT HEART CATH AND CORONARY ANGIOGRAPHY N/A 03/26/2021  ? Procedure: RIGHT/LEFT HEART CATH AND CORONARY ANGIOGRAPHY;  Surgeon: Kathleene Hazel, MD;  Location: [facility];  Service: Cardiovascular;  Laterality: N/A;  ? ? ?Current Outpatient Medications  ?Medication Instructions  ? aspirin EC 81 mg, Oral, Daily  ? atorvastatin (LIPITOR) 10 mg, Oral, Daily  ? carvedilol (COREG) 9.375 mg, Oral, 2 times daily with meals  ? Coenzyme Q10 100 mg, Oral, Daily  ? dapagliflozin propanediol (FARXIGA) 10 mg, Oral, Daily before breakfast  ? furosemide (LASIX) 40 mg, Oral, Daily  ? losartan (COZAAR) 25 mg, Oral, Daily  ? melatonin 5 mg, Oral, Daily at bedtime  ? polyethylene glycol (MIRALAX / GLYCOLAX) 17 g, Oral, Daily at bedtime  ? sildenafil (VIAGRA) 50 mg, Oral, As needed  ? spironolactone (ALDACTONE) 25 mg, Oral, Daily  ? Vitamin D (Ergocalciferol) (DRISDOL) 50,000 Units, Oral, Weekly  ? ? ?Allergies as of 06/23/2021 - Review Complete 06/23/2021  ?Allergen  Reaction Noted  ? Lisinopril Swelling 05/05/2016  ? ? ?Family History  ?Problem Relation Age of Onset  ? Alcohol abuse Mother   ? Heart disease Mother   ? High blood pressure Mother   ? High Cholesterol Mother   ? Sudden death Mother   ? Alcohol abuse Father   ? Heart failure Father   ? Heart disease Maternal Grandmother   ? Stroke Maternal Grandmother   ? Diabetes Neg Hx   ? ? ?Social History  ? ?Socioeconomic History  ? Marital status: Married  ?  Spouse name: [name]  ? Number of children: 3  ? Years of education: Not on file  ? Highest education level: Not on file  ?Occupational History  ? Occupation:  Retired Radiographer, therapeutic  ?Tobacco Use  ? Smoking status: Former  ?  Years: 20.00  ?  Types: Cigarettes  ?  Quit date: 04/12/2002  ?  Years since quitting: 19.2  ? Smokeless tobacco: Never  ?Vaping Use  ? Vaping Use: Never used  ?Substance and Sexual Activity  ? Alcohol use: Not Currently  ?  Alcohol/week: 0.0 standard drinks  ? Drug use: No  ?  Comment: history of marijuana use  ? Sexual activity: Yes  ?Other Topics Concern  ? Not on file  ?Social History Narrative  ? 2 sons and daughter  ? 1 son in [name]. Living with wife 11yrs [name]).  Use to live near [place].  ? ?Social Determinants of Health  ? ?Financial Resource Strain: Not on file  ?Food Insecurity: Not on file  ?Transportation Needs: No Transportation Needs  ? Lack of Transportation (Medical): No  ? Lack of Transportation (Non-Medical): No  ?Physical Activity: Inactive  ? Days of Exercise per Week: 0 days  ? Minutes of Exercise per Session: 0 min  ?Stress: Not on file  ?Social Connections: Moderately Isolated  ? Frequency of Communication with Friends and Family: More than three times a week  ? Frequency of Social Gatherings with Friends and Family: More than three times a week  ? Attends Religious Services: Never  ? Active Member of Clubs or Organizations: No  ? Attends Banker Meetings: Never  ? Marital Status: Married  ?Intimate Partner Violence: Not At Risk  ? Fear of Current or Ex-Partner: No  ? Emotionally Abused: No  ? Physically Abused: No  ? Sexually Abused: No  ? ? ? ?Physical Exam: ?BP 120/68 (BP Location: Left Arm, Patient Position: Sitting, Cuff Size: Normal)   Pulse 83   Ht 5\' 10"  (1.778 m)   Wt 200 lb 8 oz (90.9 kg)   SpO2 96%   BMI 28.77 kg/m?  ?Constitutional: generally well-appearing ?Psychiatric: alert and oriented x3 ?Abdomen: soft, nontender, nondistended, no obvious ascites, no peritoneal signs, normal bowel sounds ?No peripheral edema noted in lower extremities ? ?Assessment and plan: ?76 y.o. male with Cologuard positive  stool, increased risk for procedure related issues given ejection fraction of 20 to 25% ? ?I recommended colonoscopy for him at his soonest convenience given his Cologuard positive colon cancer screening test.  This would be safest to be done in the hospital given his left ventricular ejection fraction of 20 to 25%.  I see no reason for blood tests or imaging studies prior to that. ? ?Please see the "Patient Instructions" section for addition details about the plan. ? ?Rob Bunting, MD ?[facility] ?06/23/2021, 8:54 AM ? ? ?Total time on date of encounter was 25 minutes (this included time spent  preparing to see the patient reviewing records; obtaining and/or reviewing separately obtained history; performing a medically appropriate exam and/or evaluation; counseling and educating the patient and family if present; ordering medications, tests or procedures if applicable; and documenting clinical information in the health record). ? ? ? ? ?

## 2021-06-23 NOTE — Patient Instructions (Signed)
If you are age 75 or older, your body mass index should be between 23-30. Your Body mass index is 28.77 kg/m?Marland Kitchen If this is out of the aforementioned range listed, please consider follow up with your Primary Care Provider. ?________________________________________________________ ? ?The [facility] providers would like to encourage you to use [facility] to communicate with providers for non-urgent requests or questions.  Due to long hold times on the telephone, sending your provider a message by [facility] may be a faster and more efficient way to get a response.  Please allow 48 business hours for a response.  Please remember that this is for non-urgent requests.  ?_______________________________________________________ ? ?You have been scheduled for a colonoscopy. Please follow written instructions given to you at your visit today.  ?Please pick up your prep supplies at the pharmacy within the next 1-3 days. ?If you use inhalers (even only as needed), please bring them with you on the day of your procedure. ? ?Due to recent changes in healthcare laws, you may see the results of your imaging and laboratory studies on MyChart before your provider has had a chance to review them.  We understand that in some cases there may be results that are confusing or concerning to you. Not all laboratory results come back in the same time frame and the provider may be waiting for multiple results in order to interpret others.  Please give [place] 48 hours in order for your provider to thoroughly review all the results before contacting the office for clarification of your results.  ? ?Thank you for entrusting me with your care and choosing [facility]. ? ?Dr Ardis Hughs ? ?

## 2021-06-29 ENCOUNTER — Encounter: Payer: Self-pay | Admitting: Internal Medicine

## 2021-06-29 ENCOUNTER — Other Ambulatory Visit: Payer: Self-pay

## 2021-06-29 ENCOUNTER — Ambulatory Visit (HOSPITAL_COMMUNITY)
Admission: RE | Admit: 2021-06-29 | Discharge: 2021-06-29 | Disposition: A | Discharge status: Home / Self Care | Payer: Medicare Other | Source: Ambulatory Visit | Attending: Internal Medicine | Admitting: Internal Medicine

## 2021-06-29 DIAGNOSIS — I5022 Chronic systolic (congestive) heart failure: Secondary | ICD-10-CM

## 2021-06-29 LAB — BASIC METABOLIC PANEL
Anion gap: 4 — ABNORMAL LOW (ref 5–15)
BUN: 12 mg/dL (ref 8–23)
CO2: 29 mmol/L (ref 22–32)
Calcium: 9.6 mg/dL (ref 8.9–10.3)
Chloride: 106 mmol/L (ref 98–111)
Creatinine, Ser: 1.34 mg/dL — ABNORMAL HIGH (ref 0.61–1.24)
GFR, Estimated: 56 mL/min — ABNORMAL LOW (ref >60)
Glucose, Bld: 98 mg/dL (ref 70–99)
Potassium: 4.8 mmol/L (ref 3.5–5.1)
Sodium: 139 mmol/L (ref 135–145)

## 2021-07-01 ENCOUNTER — Other Ambulatory Visit: Payer: Self-pay

## 2021-07-01 ENCOUNTER — Encounter (INDEPENDENT_AMBULATORY_CARE_PROVIDER_SITE_OTHER): Payer: Self-pay | Admitting: Family Medicine

## 2021-07-01 ENCOUNTER — Ambulatory Visit (INDEPENDENT_AMBULATORY_CARE_PROVIDER_SITE_OTHER): Payer: Medicare Other | Admitting: Family Medicine

## 2021-07-01 VITALS — BP 105/68 | HR 83 | Temp 97.7°F | Ht 70.0 in | Wt 195.0 lb

## 2021-07-01 DIAGNOSIS — E669 Obesity, unspecified: Secondary | ICD-10-CM | POA: Diagnosis not present

## 2021-07-01 DIAGNOSIS — Z6828 Body mass index (BMI) 28.0-28.9, adult: Secondary | ICD-10-CM | POA: Diagnosis not present

## 2021-07-01 DIAGNOSIS — E559 Vitamin D deficiency, unspecified: Secondary | ICD-10-CM | POA: Diagnosis not present

## 2021-07-01 MED ORDER — VITAMIN D (ERGOCALCIFEROL) 1.25 MG (50000 UNIT) PO CAPS: 50000.0000 [IU] | ORAL_CAPSULE | ORAL | 0 refills | Status: DC

## 2021-07-03 NOTE — Progress Notes (Signed)
? ? ? ?Chief Complaint:  ? ?OBESITY ?[name] is here to discuss his progress with his obesity treatment plan along with follow-up of his obesity related diagnoses. [name] is on keeping a food journal and adhering to recommended goals of 1200-1600 calories and 85+ grams of protein daily and states he is following his eating plan approximately 10% of the time. [name] states he is walking for 60 minutes 3 times per week. ? ?Today's visit was #: 25 ?Starting weight: 223 lbs ?Starting date: 08/15/2019 ?Today's weight: 195 lbs ?Today's date: 07/01/2021 ?Total lbs lost to date: 79 ?Total lbs lost since last in-office visit: 0 ? ?Interim History: [name] has done well with maintaining his weight. He has increased walking and he is working on increasing his water intake. He is working on Location manager, and he is trying to get better quality sleep according to his CPAP.  ? ?Subjective:  ? ?1. Vitamin D deficiency ?[name] is on Vitamin D, with no side effects noted. He requests a refill. ? ?Assessment/Plan:  ? ?1. Vitamin D deficiency ?[name] will continue prescription Vitamin D, and we will refill for 1 month. He will follow-up for routine testing of Vitamin D, at least 2-3 times per year to avoid over-replacement. ? ?- Vitamin D, Ergocalciferol, (DRISDOL) 1.25 MG (50000 UNIT) CAPS capsule; Take 1 capsule (50,000 Units total) by mouth once a week.  Dispense: 4 capsule; Refill: 0 ? ?2. Obesity with current BMI 28.1 ?[name] is currently in the action stage of change. As such, his goal is to continue with weight loss efforts. He has agreed to keeping a food journal and adhering to recommended goals of 1200-1600 calories and 85+ grams of protein daily.  ? ?Exercise goals: As is. ? ?Behavioral modification strategies: decreasing sodium intake and keeping a strict food journal. ? ?[name] has agreed to follow-up with our clinic in 4 weeks. He was informed of the importance of frequent follow-up visits to maximize his success with  intensive lifestyle modifications for his multiple health conditions.  ? ?Objective:  ? ?Blood pressure 105/68, pulse 83, temperature 97.7 ?F (36.5 ?C), height 5\' 10"  (1.778 m), weight 195 lb (88.5 kg), SpO2 97 %. ?Body mass index is 27.98 kg/m?. ? ?General: Cooperative, alert, well developed, in no acute distress. ?HEENT: Conjunctivae and lids unremarkable. ?Cardiovascular: Regular rhythm.  ?Lungs: Normal work of breathing. ?Neurologic: No focal deficits.  ? ?Lab Results  ?Component Value Date  ? CREATININE 1.34 (H) 06/29/2021  ? BUN 12 06/29/2021  ? NA 139 06/29/2021  ? K 4.8 06/29/2021  ? CL 106 06/29/2021  ? CO2 29 06/29/2021  ? ?Lab Results  ?Component Value Date  ? ALT 13 05/21/2021  ? AST 18 05/21/2021  ? ALKPHOS 61 05/21/2021  ? BILITOT 0.6 05/21/2021  ? ?Lab Results  ?Component Value Date  ? HGBA1C 5.8 (H) 02/16/2021  ? HGBA1C 5.7 (H) 06/25/2020  ? HGBA1C 5.5 02/12/2020  ? HGBA1C 5.9 (H) 11/13/2019  ? HGBA1C 6.1 (H) 08/15/2019  ? ?Lab Results  ?Component Value Date  ? INSULIN 14.9 02/16/2021  ? INSULIN 6.3 06/25/2020  ? INSULIN 7.6 02/12/2020  ? INSULIN 10.2 11/13/2019  ? INSULIN 10.0 08/15/2019  ? ?Lab Results  ?Component Value Date  ? TSH 1.570 12/12/2020  ? ?Lab Results  ?Component Value Date  ? CHOL 136 02/16/2021  ? HDL 52 02/16/2021  ? [name] 71 02/16/2021  ? TRIG 65 02/16/2021  ? CHOLHDL 2.6 06/25/2020  ? ?Lab Results  ?Component  Value Date  ? VD25OH 58.8 02/16/2021  ? VD25OH 53.4 06/25/2020  ? VD25OH 63.0 02/12/2020  ? ?Lab Results  ?Component Value Date  ? WBC 4.4 06/19/2021  ? HGB 17.0 06/19/2021  ? HCT 50.0 06/19/2021  ? MCV 82.4 06/19/2021  ? PLT 158 06/19/2021  ? ?No results found for: IRON, TIBC, FERRITIN ? ?Obesity Behavioral Intervention:  ? ?Approximately 15 minutes were spent on the discussion below. ? ?ASK: ?We discussed the diagnosis of obesity with [name] today and [name] agreed to give [place] permission to discuss obesity behavioral modification therapy today. ? ?ASSESS: ?[name] has the  diagnosis of obesity and his BMI today is 28.1. [name] is in the action stage of change.  ? ?ADVISE: ?Talbert was educated on the multiple health risks of obesity as well as the benefit of weight loss to improve his health. He was advised of the need for long term treatment and the importance of lifestyle modifications to improve his current health and to decrease his risk of future health problems. ? ?AGREE: ?Multiple dietary modification options and treatment options were discussed and [name] agreed to follow the recommendations documented in the above note. ? ?ARRANGE: ?[name] was educated on the importance of frequent visits to treat obesity as outlined per CMS and USPSTF guidelines and agreed to schedule his next follow up appointment today. ? ?Attestation Statements:  ? ?Reviewed by clinician on day of visit: allergies, medications, problem list, medical history, surgical history, family history, social history, and previous encounter notes. ? ? ?I, [name], am acting as transcriptionist for Dennard Nip, MD. ? ?I have reviewed the above documentation for accuracy and completeness, and I agree with the above. -  Dennard Nip, MD ? ? ?

## 2021-07-13 ENCOUNTER — Ambulatory Visit: Payer: Medicare Other | Admitting: Cardiology

## 2021-07-13 NOTE — Progress Notes (Signed)
?  ?Advanced Heart Failure Clinic Note  ? ?PCP: Debbrah Alar, NP ?Cardiology: Dr. Gardiner Rhyme ?HF Cardiology: Dr. Aundra Dubin  ? ?HPI:  ?76 y.o. with history of chronic systolic CHF and anomalous RCA was referred by Dr. Gardiner Rhyme for evaluation of CHF.  Patient had heart catheterization in 03/2021 with anomalous RCA but only mild nonobstructive CAD.  Most recent echo in 04/2021 showed EF 20-25%, mild RV dysfunction. Regarding the anomalous RCA, Cardiolite in 11/2020 showed no ischemia.  Cardiac MRI in 01/2021 showed mid-myocardial LGE in the anterolateral and inferoseptal walls.  Of note, patient's mother had CHF and died of SCD at 77, his sister also has CHF and sounds like she had a possible SCD event.  Patient was a heavy drinker prior to 2008, and drank moderately until 2022 (has now quit).  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  ?  ?He recently presented to AHF Clinic on 06/19/21 with Dr. Aundra Dubin. He reported doing fairly well.  No dyspnea walking on flat ground. Could walk up a flight of stairs.  Generally only noted shortness of breath with heavy exertion.  No chest pain.  No lightheadedness.  He had been going to the gym some for exercise.  ? ?Today he returns to HF clinic for pharmacist medication titration. At last visit with MD, spironolactone was increased to 25 mg daily and carvedilol was increased to 9.375 mg BID. Overall, patient reports feeling well today, he's looking forward to the gardening season. Denies dizziness, lightheadedness and fatigue. Denies CP. Denies SOB/DOE. Uses CPAP at home. Reports walking 1 hour every day (about 2 miles). Home weights have been stable between 195-200 lbs. Takes Lasix 40 mg daily and has not needed any extra. No LEE noted, denies orthopnea/PND. Endorses reduced appetite. He follows a low sodium diet.  ? ? ?HF Medications: ?Carvedilol 9.375 mg BID ?Losartan 25 mg daily ?Spironolactone 25 mg daily ?Farxiga 10 mg daily ?Lasix 40 mg daily ? ?Has the patient been  experiencing any side effects to the medications prescribed?  No.  ? ?Does the patient have any problems obtaining medications due to transportation or finances?    ?Has Medicare part A/B and [place] for medications.  ? ?Understanding of regimen: good ?Understanding of indications: good ?Potential of compliance: good ?Patient understands to avoid NSAIDs. ?Patient understands to avoid decongestants. ?  ? ?Pertinent Lab Values: ?3/20/223: Serum creatinine 1.34, BUN 12, Potassium 4.8, Sodium 139 ? ?Vital Signs: ?Weight: 199.8 lbs (last clinic weight: 195 lbs) ?Blood pressure: 105/62  ?Heart rate: 64  ? ?Assessment/Plan: ?1. Chronic systolic CHF: Nonischemic cardiomyopathy.  Cardiac MRI with mid-myocardial LGE in the anterolateral and inferoseptal walls.  Most recent echo in 04/2021 with EF 20-25%, mildly decreased RV systolic function.  Nonobstructive mild CAD on 03/2021 cath.  Cause of cardiomyopathy is uncertain.  Possible familial cardiomyopathy given mother and sister's histories.  ETOH could also play a role, though he stopped heavy ETOH in 2008 per his report (stopped completely in 2022).  Viral myocarditis is also a consideration given the LGE pattern. No significant LVH, amyloidosis unlikely.   ?- NYHA class II symptoms, not volume overloaded on exam.  ?- Continue Lasix 40 mg daily ?- Increase carvedilol to 12.5 mg BID. ?- Unable to take Entresto or ACEIs due to angioedema.  Tolerating losartan 25 mg daily. ?- Continue spironolactone 25 mg daily.  ?- Continue Farxiga 10 mg daily.  ?- Patient has spoken with Dr. Caryl Comes about ICD, he is leaning towards getting  one.  He has a nonischemic cardiomyopathy, he has significant LGE on cMRI and a family history of SCD.  IVCD but QRS only 132 msec so unlikely to be good candidate for CRT.  ?2. Anomalous RCA: Patient has an anomalous RCA from left main passing between aorta and PA.  Cardiolite in 11/2020 showed no ischemia and he has had no chest pain. Continue to manage  conservatively (no surgery) per Dr. Aundra Dubin.  ?3. OSA: Use CPAP.  ? ?Follow up in 3 weeks with pharmacy clinic, 1 month with Dr. Aundra Dubin.  ? ? ?Audry Riles, PharmD, BCPS, BCCP, CPP ?Heart Failure Clinic Pharmacist ?[phone] ?   ?

## 2021-07-14 ENCOUNTER — Ambulatory Visit (HOSPITAL_COMMUNITY)
Admission: RE | Admit: 2021-07-14 | Discharge: 2021-07-14 | Disposition: A | Discharge status: Home / Self Care | Payer: Medicare Other | Source: Ambulatory Visit | Attending: Cardiology | Admitting: Cardiology

## 2021-07-14 VITALS — BP 105/64 | HR 64 | Wt 199.8 lb

## 2021-07-14 DIAGNOSIS — Z9989 Dependence on other enabling machines and devices: Secondary | ICD-10-CM | POA: Insufficient documentation

## 2021-07-14 DIAGNOSIS — I2510 Atherosclerotic heart disease of native coronary artery without angina pectoris: Secondary | ICD-10-CM | POA: Insufficient documentation

## 2021-07-14 DIAGNOSIS — Z8249 Family history of ischemic heart disease and other diseases of the circulatory system: Secondary | ICD-10-CM | POA: Diagnosis not present

## 2021-07-14 DIAGNOSIS — Z79899 Other long term (current) drug therapy: Secondary | ICD-10-CM | POA: Diagnosis not present

## 2021-07-14 DIAGNOSIS — I5022 Chronic systolic (congestive) heart failure: Secondary | ICD-10-CM

## 2021-07-14 DIAGNOSIS — Q245 Malformation of coronary vessels: Secondary | ICD-10-CM | POA: Insufficient documentation

## 2021-07-14 DIAGNOSIS — G4733 Obstructive sleep apnea (adult) (pediatric): Secondary | ICD-10-CM | POA: Insufficient documentation

## 2021-07-14 DIAGNOSIS — Z7984 Long term (current) use of oral hypoglycemic drugs: Secondary | ICD-10-CM | POA: Insufficient documentation

## 2021-07-14 MED ORDER — CARVEDILOL 12.5 MG PO TABS: 12.5000 mg | ORAL_TABLET | Freq: Two times a day (BID) | ORAL | 3 refills | Status: AC

## 2021-07-14 NOTE — Patient Instructions (Signed)
It was a pleasure seeing you today!  MEDICATIONS: -We are changing your medications today -Increase carvedilol to 12.5 mg (1 tablet) twice daily. You may take 2 tablets of the 6.25 mg strength twice daily until you pick up the new strength.  -Call if you have questions about your medications.   NEXT APPOINTMENT: Return to clinic in 3 weeks with Pharmacy Clinic.  In general, to take care of your heart failure: -Limit your fluid intake to 2 Liters (half-gallon) per day.   -Limit your salt intake to ideally 2-3 grams (2000-3000 mg) per day. -Weigh yourself daily and record, and bring that "weight diary" to your next appointment.  (Weight gain of 2-3 pounds in 1 day typically means fluid weight.) -The medications for your heart are to help your heart and help you live longer.   -Please contact us before stopping any of your heart medications.  Call the clinic at [phone] with questions or to reschedule future appointments. - 

## 2021-07-30 ENCOUNTER — Encounter (INDEPENDENT_AMBULATORY_CARE_PROVIDER_SITE_OTHER): Payer: Medicare Other | Admitting: Family Medicine

## 2021-07-30 DIAGNOSIS — E538 Deficiency of other specified B group vitamins: Secondary | ICD-10-CM | POA: Diagnosis not present

## 2021-07-30 DIAGNOSIS — E559 Vitamin D deficiency, unspecified: Secondary | ICD-10-CM | POA: Diagnosis not present

## 2021-07-30 MED ORDER — VITAMIN D (ERGOCALCIFEROL) 1.25 MG (50000 UNIT) PO CAPS: 50000.0000 [IU] | ORAL_CAPSULE | ORAL | 1 refills | Status: AC

## 2021-07-31 LAB — VITAMIN B12: Vitamin B-12: 705 pg/mL (ref 232–1245)

## 2021-07-31 LAB — VITAMIN D 25 HYDROXY (VIT D DEFICIENCY, FRACTURES): Vit D, 25-Hydroxy: 60.2 ng/mL (ref 30.0–100.0)

## 2021-08-03 NOTE — Progress Notes (Incomplete)
?  ?Advanced Heart Failure Clinic Note  ? ?PCP: Debbrah Alar, NP ?Cardiology: Dr. Gardiner Rhyme ?HF Cardiology: Dr. Aundra Dubin  ? ?HPI:  ?76 y.o. with history of chronic systolic CHF and anomalous RCA was referred by Dr. Gardiner Rhyme for evaluation of CHF.  Patient had heart catheterization in 03/2021 with anomalous RCA but only mild nonobstructive CAD.  Most recent echo in 04/2021 showed EF 20-25%, mild RV dysfunction. Regarding the anomalous RCA, Cardiolite in 11/2020 showed no ischemia.  Cardiac MRI in 01/2021 showed mid-myocardial LGE in the anterolateral and inferoseptal walls.  Of note, patient's mother had CHF and died of SCD at 29, his sister also has CHF and sounds like she had a possible SCD event.  Patient was a heavy drinker prior to 2008, and drank moderately until 2022 (has now quit).  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  ?  ?He recently presented to AHF Clinic on 06/19/21 with Dr. Aundra Dubin. He reported doing fairly well.  No dyspnea walking on flat ground. Could walk up a flight of stairs.  Generally only noted shortness of breath with heavy exertion.  No chest pain.  No lightheadedness.  He had been going to the gym some for exercise.  ? ?Today he returns to HF clinic for pharmacist medication titration. At last visit with MD, spironolactone was increased to 25 mg daily and carvedilol was increased to 9.375 mg BID. Overall, patient reports feeling well today, he's looking forward to the gardening season. Denies dizziness, lightheadedness and fatigue. Denies CP. Denies SOB/DOE. Uses CPAP at home. Reports walking 1 hour every day (about 2 miles). Home weights have been stable between 195-200 lbs. Takes Lasix 40 mg daily and has not needed any extra. No LEE noted, denies orthopnea/PND. Endorses reduced appetite. He follows a low sodium diet.  ? ?Today he returns to HF clinic for pharmacist medication titration. At last visit with MD ***.  ? ?Overall feeling ***. ?Dizziness, lightheadedness, fatigue:   ?Chest pain or palpitations: ? ?How is your breathing?: *** ?SOB: ?Able to complete all ADLs. Activity level *** ? ?Weight at home pounds. Takes furosemide/torsemide/bumex *** mg *** daily.  ?LEE ?PND/Orthopnea ? ?Appetite *** ?Low-salt diet:  ? ?Physical Exam ?Cost/affordability of meds ? ? ?HF Medications: ?Carvedilol 12.5mg  BID ?Losartan 25 mg daily ?Spironolactone 25 mg daily ?Farxiga 10 mg daily ?Lasix 40 mg daily ? ?Has the patient been experiencing any side effects to the medications prescribed?  No.  ? ?Does the patient have any problems obtaining medications due to transportation or finances?    ?Has Medicare part A/B and [place] for medications.  ? ?Understanding of regimen: good ?Understanding of indications: good ?Potential of compliance: good ?Patient understands to avoid NSAIDs. ?Patient understands to avoid decongestants. ?  ? ?Pertinent Lab Values: ?3/20/223: Serum creatinine 1.34, BUN 12, Potassium 4.8, Sodium 139 ? ?Vital Signs: ?Weight: 199.8 lbs (last clinic weight: 195 lbs) ?Blood pressure: 105/62  ?Heart rate: 64  ? ?Plan:  ?A: No changes based on HR and BP  ?B: Increase to carvedilol 18.75 mg bid ? ?Assessment/Plan: ?1. Chronic systolic CHF: Nonischemic cardiomyopathy.  Cardiac MRI with mid-myocardial LGE in the anterolateral and inferoseptal walls.  Most recent echo in 04/2021 with EF 20-25%, mildly decreased RV systolic function.  Nonobstructive mild CAD on 03/2021 cath.  Cause of cardiomyopathy is uncertain.  Possible familial cardiomyopathy given mother and sister's histories.  ETOH could also play a role, though he stopped heavy ETOH in 2008 per his report (stopped  completely in 2022).  Viral myocarditis is also a consideration given the LGE pattern. No significant LVH, amyloidosis unlikely.   ?- NYHA class II symptoms, not volume overloaded on exam.  ?- Continue Lasix 40 mg daily ?- Increase carvedilol to 12.5 mg BID. ?- Unable to take Entresto or ACEIs due to angioedema.  Tolerating  losartan 25 mg daily. ?- Continue spironolactone 25 mg daily.  ?- Continue Farxiga 10 mg daily.  ?- Patient has spoken with Dr. Caryl Comes about ICD, he is leaning towards getting one.  He has a nonischemic cardiomyopathy, he has significant LGE on cMRI and a family history of SCD.  IVCD but QRS only 132 msec so unlikely to be good candidate for CRT.  ?2. Anomalous RCA: Patient has an anomalous RCA from left main passing between aorta and PA.  Cardiolite in 11/2020 showed no ischemia and he has had no chest pain. Continue to manage conservatively (no surgery) per Dr. Aundra Dubin.  ?3. OSA: Use CPAP.  ? ?Follow up in 3 weeks with pharmacy clinic, 1 month with Dr. Aundra Dubin.  ? ? ?Audry Riles, PharmD, BCPS, BCCP, CPP ?Heart Failure Clinic Pharmacist ?[phone] ?   ?

## 2021-08-04 ENCOUNTER — Ambulatory Visit (HOSPITAL_COMMUNITY)
Admission: RE | Admit: 2021-08-04 | Discharge: 2021-08-04 | Disposition: A | Discharge status: Home / Self Care | Payer: Medicare Other | Source: Ambulatory Visit | Attending: Cardiology | Admitting: Cardiology

## 2021-08-04 VITALS — BP 100/58 | HR 66 | Wt 198.0 lb

## 2021-08-04 DIAGNOSIS — Z7901 Long term (current) use of anticoagulants: Secondary | ICD-10-CM | POA: Diagnosis not present

## 2021-08-04 DIAGNOSIS — G4733 Obstructive sleep apnea (adult) (pediatric): Secondary | ICD-10-CM | POA: Diagnosis not present

## 2021-08-04 DIAGNOSIS — Z79899 Other long term (current) drug therapy: Secondary | ICD-10-CM | POA: Diagnosis not present

## 2021-08-04 DIAGNOSIS — Q245 Malformation of coronary vessels: Secondary | ICD-10-CM | POA: Diagnosis not present

## 2021-08-04 DIAGNOSIS — Z9989 Dependence on other enabling machines and devices: Secondary | ICD-10-CM | POA: Insufficient documentation

## 2021-08-04 DIAGNOSIS — I428 Other cardiomyopathies: Secondary | ICD-10-CM | POA: Diagnosis not present

## 2021-08-04 DIAGNOSIS — Z7984 Long term (current) use of oral hypoglycemic drugs: Secondary | ICD-10-CM | POA: Insufficient documentation

## 2021-08-04 DIAGNOSIS — I2510 Atherosclerotic heart disease of native coronary artery without angina pectoris: Secondary | ICD-10-CM | POA: Insufficient documentation

## 2021-08-04 DIAGNOSIS — I5022 Chronic systolic (congestive) heart failure: Secondary | ICD-10-CM

## 2021-08-04 MED ORDER — FUROSEMIDE 40 MG PO TABS: 40.0000 mg | ORAL_TABLET | Freq: Every day | ORAL | 3 refills | Status: AC

## 2021-08-04 NOTE — Progress Notes (Signed)
?  ?Advanced Heart Failure Clinic Note  ? ?PCP: Debbrah Alar, NP ?Cardiology: Dr. Gardiner Rhyme ?HF Cardiology: Dr. Aundra Dubin  ? ?HPI:  ?75 y.o. with history of chronic systolic CHF and anomalous RCA was referred by Dr. Gardiner Rhyme for evaluation of CHF.  Heart catheterization in 03/2021 showed anomalous RCA but only mild nonobstructive CAD.  Most recent echo in 04/2021 showed EF 20-25%, mild RV dysfunction. Regarding the anomalous RCA, Cardiolite in 11/2020 showed no ischemia.  Cardiac MRI in 01/2021 showed mid-myocardial LGE in the anterolateral and inferoseptal walls.  Of note, patient's mother had CHF and died of SCD at 30, his sister also has CHF and sounds like she had a possible SCD event.  Patient was a heavy drinker prior to 2008, and drank moderately until 2022 (has now quit).  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  ?  ?He recently presented to AHF Clinic on 06/19/21 with Dr. Aundra Dubin. He reported doing fairly well.  No dyspnea walking on flat ground and he could walk up a flight of stairs.  Generally only noted shortness of breath with heavy exertion.  No chest pain.  No lightheadedness.  He had been going to the gym some for exercise.  ? ?Today he returns to HF clinic with his wife for pharmacist medication titration. At recent clinic visits spironolactone was increased to 25 mg daily and carvedilol was increased to 12.5 mg BID. He reports feeling fine overall. He denies dizziness, lightheadedness, or fatigue. He denies any episodes of chest pain or palpitations. He says his breathing is good and he is able to work outside in the garden without SOB. He reports walking 1 hour every day (about 2 miles). Uses CPAP at home. His home weights have been stable between 199-201 lbs. He takes Lasix 40 mg daily and has not needed any extra doses. No LEE noted on exam. He denies PND/orthopnea. His appetite is good, says he and his wife are planning on switching to a vegan diet soon. He endorses adhering to a low salt  diet.  ? ?HF Medications: ?Carvedilol 12.5 mg BID ?Losartan 25 mg daily ?Spironolactone 25 mg daily ?Farxiga 10 mg daily ?Lasix 40 mg daily ? ?Has the patient been experiencing any side effects to the medications prescribed?  No.  ? ?Does the patient have any problems obtaining medications due to transportation or finances?    ?Has Medicare part A/B and [place] for medications.  ? ?Understanding of regimen: good ?Understanding of indications: good ?Potential of compliance: good ?Patient understands to avoid NSAIDs. ?Patient understands to avoid decongestants. ?  ? ?Pertinent Lab Values: ?3/20/223: Serum creatinine 1.34, BUN 12, Potassium 4.8, Sodium 139 ? ?Vital Signs: ?Weight: 199.8 lbs (last clinic weight: 195 lbs) ?Blood pressure: 100/58 ?Heart rate: 66 ? ?Assessment/Plan: ?1. Chronic systolic CHF: Nonischemic cardiomyopathy.  Cardiac MRI with mid-myocardial LGE in the anterolateral and inferoseptal walls.  Most recent echo in 04/2021 with EF 20-25%, mildly decreased RV systolic function.  Nonobstructive mild CAD on 03/2021 cath.  Cause of cardiomyopathy is uncertain.  Possible familial cardiomyopathy given mother and sister's histories.  ETOH could also play a role, though he stopped heavy ETOH in 2008 per his report (stopped completely in 2022).  Viral myocarditis is also a consideration given the LGE pattern. No significant LVH, amyloidosis unlikely.   ?- NYHA class II symptoms, not volume overloaded on exam.  ?- No medication changes as he is unlikely to tolerate them at this time given his low HR/BP.  ?-  Continue Lasix 40 mg daily ?- Continue carvedilol 12.5 mg BID. Consider increasing in the future if HR/BP allows. ?- Continue losartan 25 mg daily. Unable to take Entresto or ACEIs due to history of angioedema.  ?- Continue spironolactone 25 mg daily.  ?- Continue Farxiga 10 mg daily.  ?- Patient has spoken with Dr. Caryl Comes about ICD, he is leaning towards getting one.  He has a nonischemic cardiomyopathy, he  has significant LGE on cMRI and a family history of SCD.  IVCD but QRS only 132 msec so unlikely to be good candidate for CRT.  ? ?2. Anomalous RCA: Patient has an anomalous RCA from left main passing between aorta and PA.  Cardiolite in 11/2020 showed no ischemia and he has had no chest pain. Continue to manage conservatively (no surgery) per Dr. Aundra Dubin.  ? ?3. OSA: Use CPAP.  ? ?Follow up 08/19/2021 with Dr. Aundra Dubin.  ? ?Audry Riles, PharmD, BCPS, BCCP, CPP ?Heart Failure Clinic Pharmacist ?[phone] ?   ?

## 2021-08-04 NOTE — Patient Instructions (Signed)
It was a pleasure seeing you today! ? ?MEDICATIONS: ?-No medication changes today ?-Call if you have questions about your medications. ? ?NEXT APPOINTMENT: ?Return to clinic in 08/19/2021 with Dr. Shirlee Latch. ? ?In general, to take care of your heart failure: ?-Limit your fluid intake to 2 Liters (half-gallon) per day.   ?-Limit your salt intake to ideally 2-3 grams (2000-3000 mg) per day. ?-Weigh yourself daily and record, and bring that "weight diary" to your next appointment.  (Weight gain of 2-3 pounds in 1 day typically means fluid weight.) ?-The medications for your heart are to help your heart and help you live longer.   ?-Please contact us before stopping any of your heart medications. ? ?Call the clinic at [phone] with questions or to reschedule future appointments.  ?

## 2021-08-08 DIAGNOSIS — I469 Cardiac arrest, cause unspecified: Secondary | ICD-10-CM | POA: Diagnosis not present

## 2021-08-08 DIAGNOSIS — R404 Transient alteration of awareness: Secondary | ICD-10-CM | POA: Diagnosis not present

## 2021-08-08 DIAGNOSIS — R4020 Unspecified coma: Secondary | ICD-10-CM | POA: Diagnosis not present

## 2021-08-08 DIAGNOSIS — R0689 Other abnormalities of breathing: Secondary | ICD-10-CM | POA: Diagnosis not present

## 2021-08-10 ENCOUNTER — Telehealth: Payer: Self-pay

## 2021-08-10 ENCOUNTER — Telehealth: Payer: Self-pay | Admitting: Family

## 2021-08-10 DIAGNOSIS — 419620001 Death: Secondary | SNOMED CT | POA: Diagnosis not present

## 2021-08-10 DEATH — deceased

## 2021-08-13 ENCOUNTER — Ambulatory Visit (HOSPITAL_COMMUNITY): Admission: RE | Admit: 2021-08-13 | Payer: Medicare Other | Source: Home / Self Care | Admitting: Gastroenterology

## 2021-08-13 SURGERY — COLONOSCOPY WITH PROPOFOL
Anesthesia: Monitor Anesthesia Care

## 2021-08-19 ENCOUNTER — Encounter (HOSPITAL_COMMUNITY): Payer: Medicare Other | Admitting: Cardiology

## 2021-09-10 ENCOUNTER — Ambulatory Visit (INDEPENDENT_AMBULATORY_CARE_PROVIDER_SITE_OTHER): Payer: Medicare Other | Admitting: Family Medicine

## 2021-09-10 NOTE — Telephone Encounter (Signed)
Patient's wife called to inform MO that patient passed away  ? ?Patients wife would like to discuss with MO ? ?[phone] please advise ?

## 2021-09-10 NOTE — Telephone Encounter (Signed)
?  Spoke to pt's wife. She states pt was out doing yard work and collapsed. He was seen by a biker going by who called 911 and did CPR. EMS worked on pt for >1 hour and was unable to restore heart beat.  They have chosen [facility] site. ? ?

## 2021-09-10 NOTE — Telephone Encounter (Signed)
Initial Comment Caller states patient is deceased. Wants to know if she can sign the death certificate? ?Patient Name [name] ?Patient DOB [birth year] ?Requesting Provider Jacques Earthly, paramedic ?Physician Number [phone] ?Facility Name [facility] ?Room Number N/A ?Disp. Time Disposition Final User ?2021/09/03 6:20:06 PM Send to [facility] Paging Queue Monyjang, Rudi ?Sep 03, 2021 6:30:13 PM Paged On Call back to Call Center - [facility] [place], [place] ?09/03/2021 6:47:21 PM Page Completed Yes [facility] ?Paging ?DoctorName Phone DateTime Result/Outcome Message Type Notes ?Santiago Bumpers - MD [id number] 03-Sep-2021 ?6:30:13 PM ?Paged On Call ?Back to Call ?Center ?Doctor Paged ?This message is from [place] in the call ?center. If you could please give [place] a ?call back at [phone] regarding a ?page. ?Santiago Bumpers - MD 2021/09/03 ?6:47:06 PM ?Spoke with On ?Call - General Message Result Provided information and connected to ?caller ?Call Closed By: [facility] ?Transaction Date/Time: 09/03/2021 6:15:54 PM (ET) ?

## 2021-09-10 NOTE — Telephone Encounter (Signed)
[name] called from [facility] regarding patient  ? ?Patients death certificate has been entered within the [facility] system  ? ?[phone] ?

## 2021-09-10 NOTE — Telephone Encounter (Signed)
Please see encounter below

## 2021-09-10 DEATH — deceased

## 2021-09-23 NOTE — Progress Notes (Signed)
error 

## 2021-10-30 ENCOUNTER — Ambulatory Visit: Payer: Medicare Other | Admitting: Family

## 2021-11-17 ENCOUNTER — Ambulatory Visit: Payer: Medicare Other

## 2022-05-14 IMAGING — MR MR ABDOMEN WO/W CM
11 of 17 series · 28 of 48 positions shown · IV contrast (18ml Multihance)
Comparison: Ultrasound March 09, 2021

CLINICAL DATA: Further evaluation hepatic cyst seen on prior
imaging.

EXAM:
MRI ABDOMEN WITHOUT AND WITH CONTRAST
TECHNIQUE: Multiplanar multisequence MR imaging of the abdomen was performed
both before and after the administration of intravenous contrast.
CONTRAST:  18mL MULTIHANCE GADOBENATE DIMEGLUMINE 529 MG/ML IV SOLN

[Series 3: cor haste · coronal · 5.0mm · 0.68mm/px · 2 of 34 slices shown]
[im 1/34]
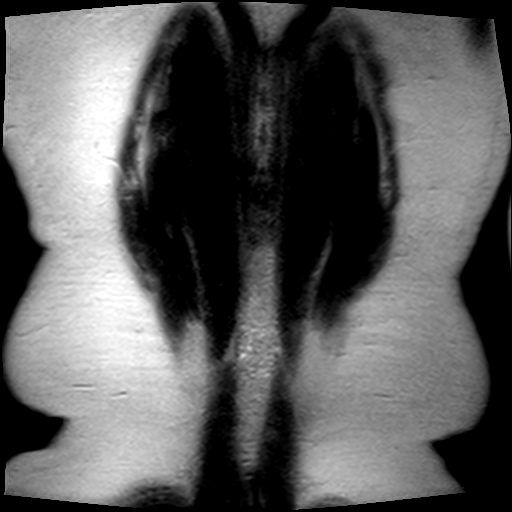
[im 34/34]
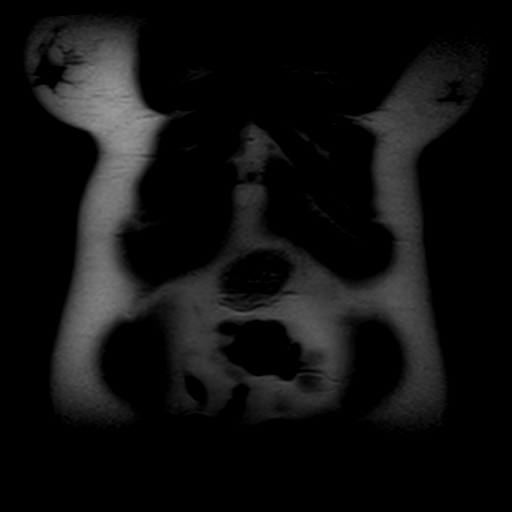

[Series 4: axial haste · axial · 6.0mm · 0.68mm/px · z∈[-136,+75]mm · 2 of 33 slices shown]
[im 1/33]
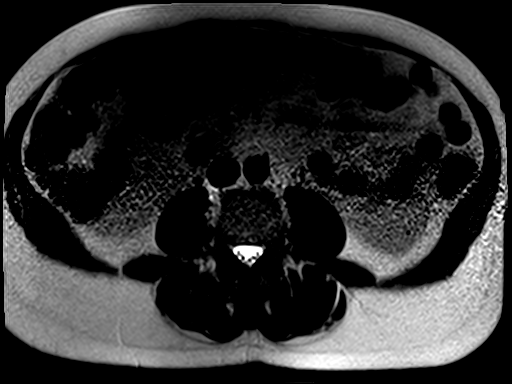
[im 33/33]
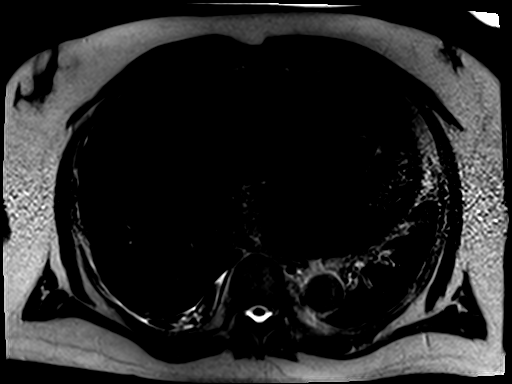

[Series 5: T1 · axial · 6.0mm · 0.68mm/px · z∈[-136,+75]mm · 4 of 66 slices shown]
[im 1/66]
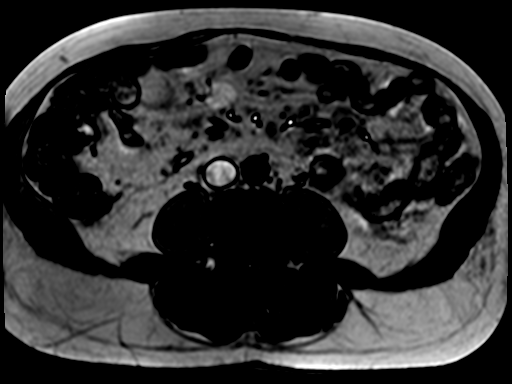
[im 22/66]
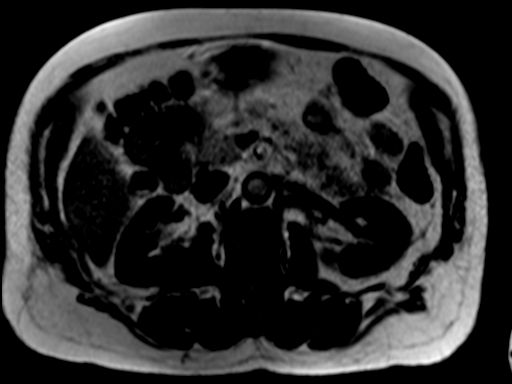
[im 44/66]
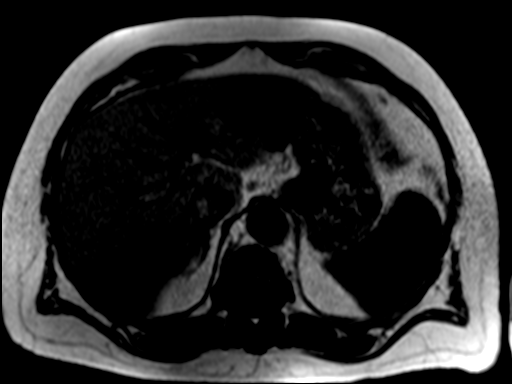
[im 66/66]
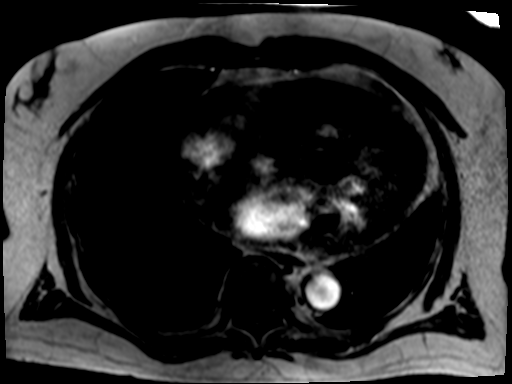

[Series 6: bSSFP · axial · 4.0mm · 0.68mm/px · z∈[-150,+90]mm · 3 of 61 slices shown]
[im 1/61]
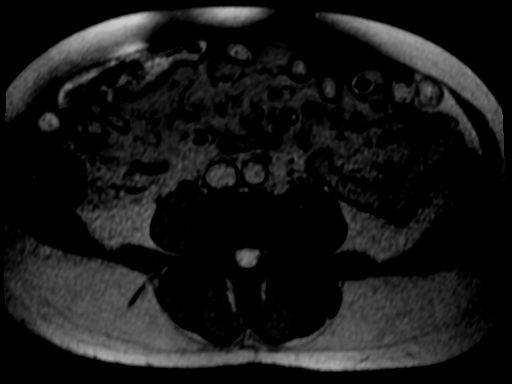
[im 31/61]
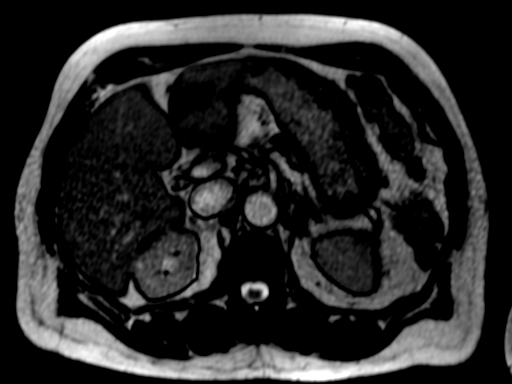
[im 61/61]
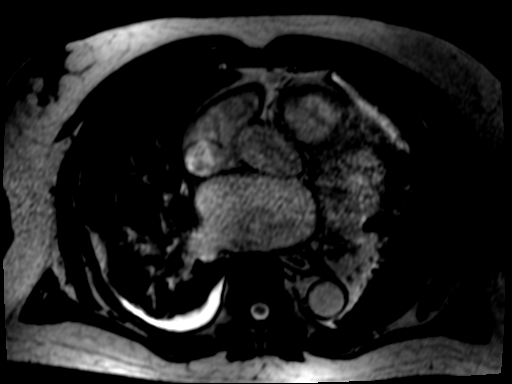

[Series 7: T2 fat-sat · axial · 6.0mm · 1.09mm/px · z∈[-142,+81]mm · 2 of 32 slices shown]
[im 1/32]
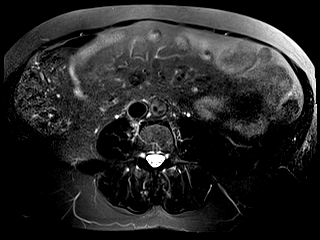
[im 32/32]
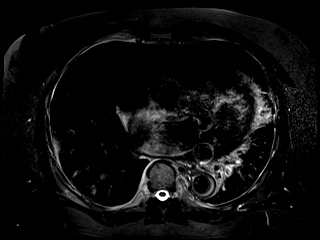

[Series 8: [id number]_trig · axial · 6.0mm · 1.82mm/px · z∈[-142,+81]mm · 4 of 96 slices shown]
[im 1/96]
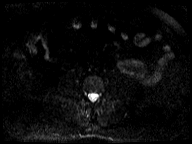
[im 32/96]
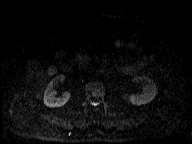
[im 64/96]
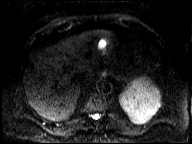
[im 96/96]
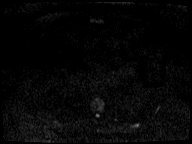

[Series 9: [id number]_trig_adc · axial · 6.0mm · 1.82mm/px · 1 of 32 slices shown]
[im 1/32]
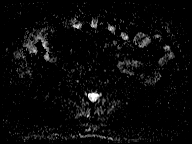

[Series 10: T1 dynamic · axial · non-contrast · 2.5mm · 0.74mm/px · z∈[-139,+79]mm · 3 of 88 slices shown]
[im 1/88]
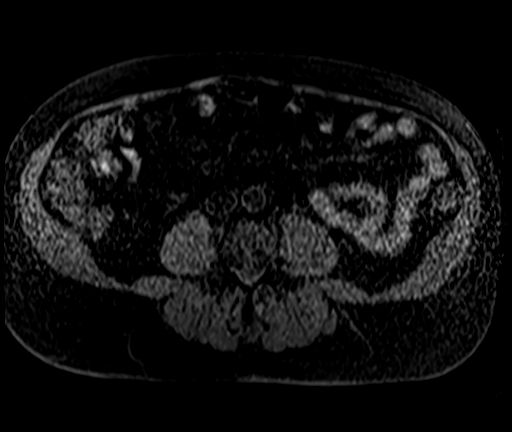
[im 44/88]
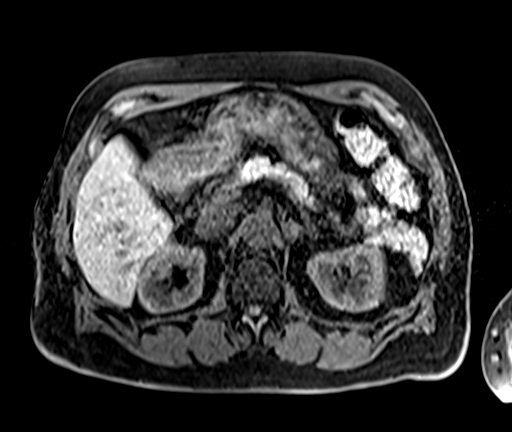
[im 88/88]
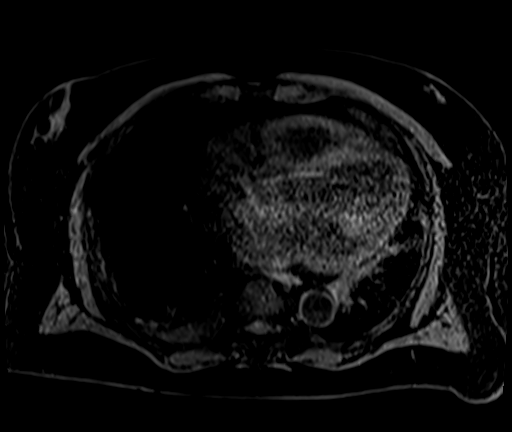

[Series 11: T1 dynamic post-contrast · axial · 2.5mm · 0.74mm/px · z∈[-139,+79]mm · 3 of 88 slices shown (1 of 3)]
[im 1/88]
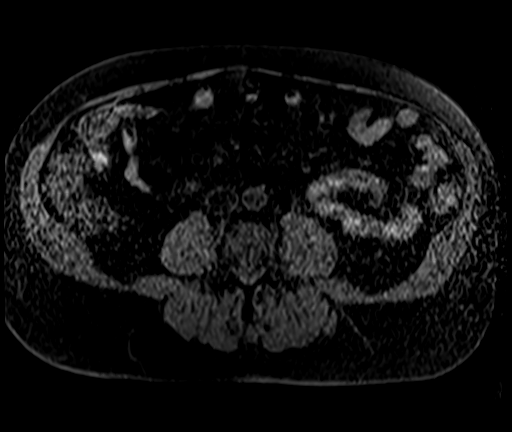
[im 44/88]
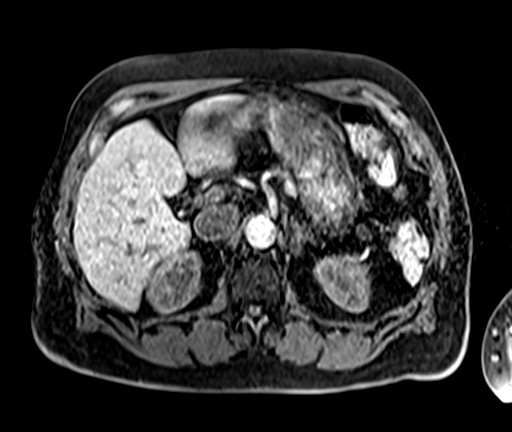
[im 88/88]
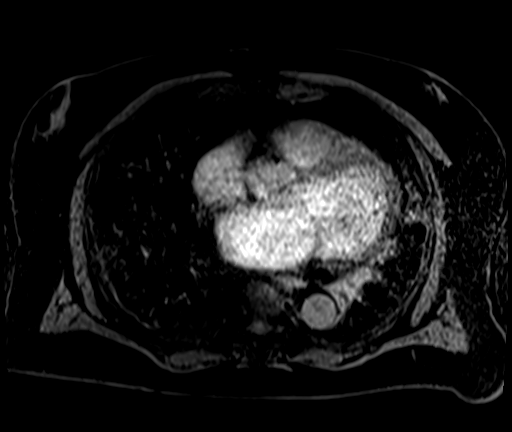

[Series 12: T1 dynamic post-contrast · axial · 2.5mm · 0.74mm/px · z∈[-139,+79]mm · 3 of 88 slices shown (2 of 3)]
[im 1/88]
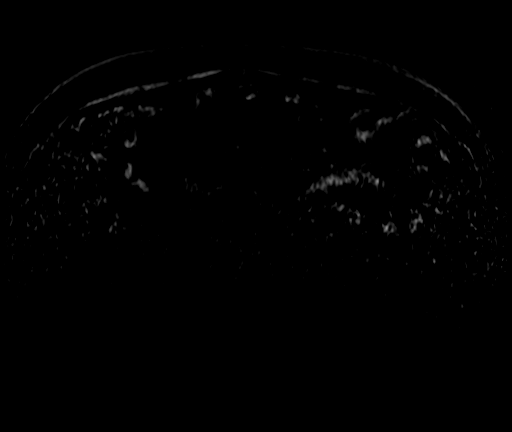
[im 44/88]
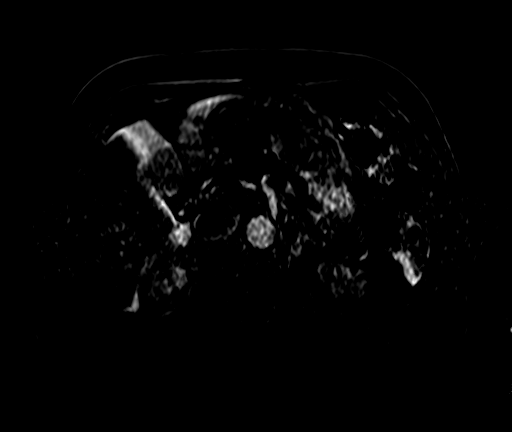
[im 88/88]
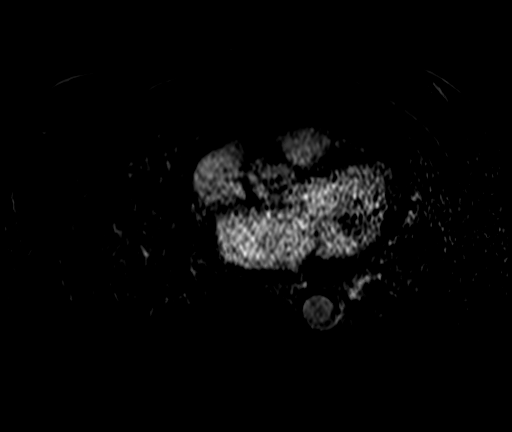

[Series 13: T1 dynamic post-contrast · axial · 2.5mm · 0.74mm/px · 1 of 88 slices shown (3 of 3)]
[im 1/88]
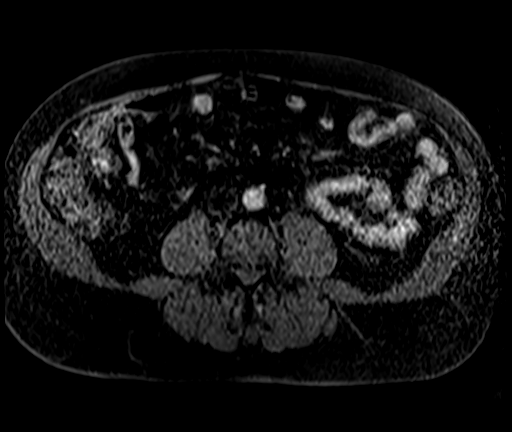

[28 of 48 positions shown; findings below may reference images not displayed]

FINDINGS: Lower chest: Cardiomegaly. Heterogeneous signal in the lung bases
may reflect pulmonary edema. Small right and trace left pleural
effusions.

Hepatobiliary: No hepatic steatosis. Lobular septated segment II
cm well-circumscribed T2 hyperintense hepatic lesion which does not
demonstrate postcontrast enhancement consistent with a complex
hepatic cyst. No suspicious hepatic lesion. Gallbladder is
unremarkable. No biliary ductal dilation.

Pancreas: Intrinsic T1 signal of the pancreatic parenchyma is within
normal limits. No pancreatic ductal dilation. No cystic or solid
hyperenhancing pancreatic lesions identified.

Spleen:  Within normal limits in size and appearance.

Adrenals/Urinary Tract: No masses identified. No evidence of
hydronephrosis.

Stomach/Bowel: Visualized portions within the abdomen are
unremarkable.

Vascular/Lymphatic: No pathologically enlarged lymph nodes
identified. No abdominal aortic aneurysm demonstrated.

Other:  None.

Musculoskeletal: No suspicious bone lesions identified.
IMPRESSION: 1. Septated segment II hepatic cyst. No suspicious hepatic lesion.
2. Small right and trace left pleural effusions with heterogeneous
signal in the lung bases possibly reflecting pulmonary edema.
